# Patient Record
Sex: Female | Born: 1955
Health system: Southern US, Community
[De-identification: ages and names within clinical notes are randomized; demographics above are authoritative.]

## PROBLEM LIST (undated history)

## (undated) DIAGNOSIS — I099 Rheumatic heart disease, unspecified: Secondary | ICD-10-CM

## (undated) DIAGNOSIS — O039 Complete or unspecified spontaneous abortion without complication: Secondary | ICD-10-CM

## (undated) DIAGNOSIS — T7840XA Allergy, unspecified, initial encounter: Secondary | ICD-10-CM

## (undated) DIAGNOSIS — I4892 Unspecified atrial flutter: Secondary | ICD-10-CM

## (undated) DIAGNOSIS — E785 Hyperlipidemia, unspecified: Secondary | ICD-10-CM

## (undated) DIAGNOSIS — I10 Essential (primary) hypertension: Secondary | ICD-10-CM

## (undated) DIAGNOSIS — R809 Proteinuria, unspecified: Secondary | ICD-10-CM

## (undated) DIAGNOSIS — K219 Gastro-esophageal reflux disease without esophagitis: Secondary | ICD-10-CM

## (undated) DIAGNOSIS — R011 Cardiac murmur, unspecified: Secondary | ICD-10-CM

## (undated) HISTORY — DX: Cardiac murmur, unspecified: R01.1

## (undated) HISTORY — PX: TONSILLECTOMY: SHX5217

## (undated) HISTORY — DX: Unspecified atrial flutter: I48.92

## (undated) HISTORY — DX: Proteinuria, unspecified: R80.9

## (undated) HISTORY — DX: Rheumatic heart disease, unspecified: I09.9

## (undated) HISTORY — DX: Complete or unspecified spontaneous abortion without complication: O03.9

## (undated) HISTORY — DX: Gastro-esophageal reflux disease without esophagitis: K21.9

## (undated) HISTORY — PX: OTHER SURGICAL HISTORY: SHX169

## (undated) HISTORY — DX: Essential (primary) hypertension: I10

## (undated) HISTORY — DX: Hyperlipidemia, unspecified: E78.5

## (undated) HISTORY — DX: Allergy, unspecified, initial encounter: T78.40XA

---

## 1997-02-16 HISTORY — PX: OTHER SURGICAL HISTORY: SHX169

## 2001-02-16 HISTORY — PX: OTHER SURGICAL HISTORY: SHX169

## 2005-06-08 ENCOUNTER — Ambulatory Visit: Payer: Self-pay | Admitting: Family Medicine

## 2005-06-15 ENCOUNTER — Ambulatory Visit: Payer: Self-pay | Admitting: Family Medicine

## 2005-06-29 ENCOUNTER — Ambulatory Visit: Payer: Self-pay | Admitting: Family Medicine

## 2005-07-06 ENCOUNTER — Ambulatory Visit: Payer: Self-pay | Admitting: Family Medicine

## 2005-07-10 ENCOUNTER — Ambulatory Visit: Payer: Self-pay | Admitting: Family Medicine

## 2005-08-31 ENCOUNTER — Ambulatory Visit: Payer: Self-pay | Admitting: Family Medicine

## 2005-10-05 ENCOUNTER — Ambulatory Visit: Payer: Self-pay | Admitting: Family Medicine

## 2005-11-02 ENCOUNTER — Ambulatory Visit: Payer: Self-pay | Admitting: Family Medicine

## 2005-11-04 ENCOUNTER — Encounter: Payer: Self-pay | Admitting: Family Medicine

## 2005-11-04 LAB — CONVERTED CEMR LAB
Cholesterol: 177 mg/dL
HDL: 40 mg/dL
LDL Cholesterol: 104 mg/dL
LDL Cholesterol: 104 mg/dL

## 2005-11-24 DIAGNOSIS — E785 Hyperlipidemia, unspecified: Secondary | ICD-10-CM

## 2005-11-24 DIAGNOSIS — R7301 Impaired fasting glucose: Secondary | ICD-10-CM

## 2005-11-24 DIAGNOSIS — I152 Hypertension secondary to endocrine disorders: Secondary | ICD-10-CM | POA: Insufficient documentation

## 2005-11-24 DIAGNOSIS — I10 Essential (primary) hypertension: Secondary | ICD-10-CM | POA: Insufficient documentation

## 2005-12-21 ENCOUNTER — Ambulatory Visit: Payer: Self-pay | Admitting: Family Medicine

## 2005-12-28 ENCOUNTER — Encounter: Payer: Self-pay | Admitting: Family Medicine

## 2005-12-28 ENCOUNTER — Ambulatory Visit: Payer: Self-pay | Admitting: Family Medicine

## 2005-12-28 ENCOUNTER — Other Ambulatory Visit: Admission: RE | Admit: 2005-12-28 | Discharge: 2005-12-28 | Payer: Self-pay | Admitting: Family Medicine

## 2005-12-28 LAB — CONVERTED CEMR LAB

## 2005-12-31 ENCOUNTER — Encounter: Payer: Self-pay | Admitting: Family Medicine

## 2006-01-18 ENCOUNTER — Encounter: Payer: Self-pay | Admitting: Family Medicine

## 2006-01-20 ENCOUNTER — Telehealth (INDEPENDENT_AMBULATORY_CARE_PROVIDER_SITE_OTHER): Payer: Self-pay | Admitting: *Deleted

## 2006-01-25 ENCOUNTER — Encounter: Payer: Self-pay | Admitting: Family Medicine

## 2006-01-27 ENCOUNTER — Ambulatory Visit: Payer: Self-pay | Admitting: Family Medicine

## 2006-02-22 ENCOUNTER — Encounter: Payer: Self-pay | Admitting: Family Medicine

## 2006-02-23 LAB — CONVERTED CEMR LAB: INR: 3.5 — ABNORMAL HIGH (ref 0.0–1.5)

## 2006-03-30 ENCOUNTER — Encounter: Payer: Self-pay | Admitting: Family Medicine

## 2006-06-14 ENCOUNTER — Ambulatory Visit: Payer: Self-pay | Admitting: Family Medicine

## 2006-08-05 ENCOUNTER — Ambulatory Visit: Payer: Self-pay | Admitting: Family Medicine

## 2006-08-05 DIAGNOSIS — Z952 Presence of prosthetic heart valve: Secondary | ICD-10-CM | POA: Insufficient documentation

## 2006-08-05 DIAGNOSIS — Z9889 Other specified postprocedural states: Secondary | ICD-10-CM | POA: Insufficient documentation

## 2006-08-09 ENCOUNTER — Telehealth: Payer: Self-pay | Admitting: Family Medicine

## 2006-09-02 ENCOUNTER — Encounter: Payer: Self-pay | Admitting: Family Medicine

## 2006-09-06 ENCOUNTER — Telehealth: Payer: Self-pay | Admitting: Family Medicine

## 2006-11-15 ENCOUNTER — Encounter: Payer: Self-pay | Admitting: Family Medicine

## 2006-11-15 LAB — HM COLONOSCOPY: HM Colonoscopy: NORMAL

## 2007-01-14 ENCOUNTER — Encounter: Payer: Self-pay | Admitting: Family Medicine

## 2007-01-20 ENCOUNTER — Other Ambulatory Visit: Admission: RE | Admit: 2007-01-20 | Discharge: 2007-01-20 | Payer: Self-pay | Admitting: Family Medicine

## 2007-01-20 ENCOUNTER — Encounter: Payer: Self-pay | Admitting: Family Medicine

## 2007-01-20 ENCOUNTER — Ambulatory Visit: Payer: Self-pay | Admitting: Family Medicine

## 2007-02-15 ENCOUNTER — Encounter: Payer: Self-pay | Admitting: Family Medicine

## 2007-02-15 LAB — CONVERTED CEMR LAB
ALT: 12 units/L (ref 0–35)
Albumin: 4.6 g/dL (ref 3.5–5.2)
CO2: 21 meq/L (ref 19–32)
Calcium: 9.9 mg/dL (ref 8.4–10.5)
Chloride: 105 meq/L (ref 96–112)
Cholesterol: 185 mg/dL (ref 0–200)
Creatinine, Ser: 0.8 mg/dL (ref 0.40–1.20)
Potassium: 4.1 meq/L (ref 3.5–5.3)
Total CHOL/HDL Ratio: 3.8

## 2007-02-16 ENCOUNTER — Encounter: Payer: Self-pay | Admitting: Family Medicine

## 2007-02-22 ENCOUNTER — Encounter: Payer: Self-pay | Admitting: Family Medicine

## 2007-02-22 ENCOUNTER — Telehealth: Payer: Self-pay | Admitting: Family Medicine

## 2007-04-05 ENCOUNTER — Encounter: Payer: Self-pay | Admitting: Family Medicine

## 2007-06-30 ENCOUNTER — Ambulatory Visit: Payer: Self-pay | Admitting: Family Medicine

## 2007-10-11 ENCOUNTER — Encounter: Payer: Self-pay | Admitting: Family Medicine

## 2007-10-11 LAB — CONVERTED CEMR LAB
Cholesterol: 139 mg/dL
HDL: 46 mg/dL
LDL Cholesterol: 72 mg/dL
Triglycerides: 103 mg/dL

## 2007-11-29 ENCOUNTER — Encounter: Payer: Self-pay | Admitting: Family Medicine

## 2008-01-30 ENCOUNTER — Encounter: Payer: Self-pay | Admitting: Family Medicine

## 2008-02-27 ENCOUNTER — Ambulatory Visit: Payer: Self-pay | Admitting: Family Medicine

## 2008-02-27 ENCOUNTER — Encounter: Payer: Self-pay | Admitting: Family Medicine

## 2008-02-27 ENCOUNTER — Other Ambulatory Visit: Admission: RE | Admit: 2008-02-27 | Discharge: 2008-02-27 | Payer: Self-pay | Admitting: Family Medicine

## 2008-02-28 ENCOUNTER — Encounter: Payer: Self-pay | Admitting: Family Medicine

## 2008-02-28 LAB — CONVERTED CEMR LAB
ALT: 17 units/L (ref 0–35)
AST: 20 units/L (ref 0–37)
Alkaline Phosphatase: 61 units/L (ref 39–117)
CO2: 24 meq/L (ref 19–32)
Creatinine, Ser: 0.75 mg/dL (ref 0.40–1.20)
LDL Cholesterol: 93 mg/dL (ref 0–99)
Pap Smear: NORMAL
Sodium: 142 meq/L (ref 135–145)
TSH: 2.698 microintl units/mL (ref 0.350–4.50)
Total Bilirubin: 0.8 mg/dL (ref 0.3–1.2)
Total CHOL/HDL Ratio: 3.4
Total Protein: 7.8 g/dL (ref 6.0–8.3)
VLDL: 20 mg/dL (ref 0–40)

## 2008-04-02 ENCOUNTER — Ambulatory Visit: Payer: Self-pay | Admitting: Family Medicine

## 2008-05-02 ENCOUNTER — Encounter: Payer: Self-pay | Admitting: Family Medicine

## 2008-08-14 ENCOUNTER — Telehealth: Payer: Self-pay | Admitting: Family Medicine

## 2008-08-27 ENCOUNTER — Encounter: Payer: Self-pay | Admitting: Family Medicine

## 2008-08-28 LAB — CONVERTED CEMR LAB
AST: 22 units/L (ref 0–37)
Albumin: 4.4 g/dL (ref 3.5–5.2)
Alkaline Phosphatase: 64 units/L (ref 39–117)
Cholesterol, target level: 200 mg/dL
HDL goal, serum: 40 mg/dL
LDL Cholesterol: 87 mg/dL (ref 0–99)
Potassium: 4.5 meq/L (ref 3.5–5.3)
Sodium: 140 meq/L (ref 135–145)
Total Bilirubin: 0.6 mg/dL (ref 0.3–1.2)
Total Protein: 7.3 g/dL (ref 6.0–8.3)
Triglycerides: 129 mg/dL (ref <150)
VLDL: 26 mg/dL (ref 0–40)

## 2008-09-03 ENCOUNTER — Ambulatory Visit: Payer: Self-pay | Admitting: Family Medicine

## 2008-09-04 ENCOUNTER — Telehealth: Payer: Self-pay | Admitting: Family Medicine

## 2008-10-02 ENCOUNTER — Encounter: Payer: Self-pay | Admitting: Family Medicine

## 2008-10-08 ENCOUNTER — Ambulatory Visit: Payer: Self-pay | Admitting: Family Medicine

## 2008-10-08 LAB — CONVERTED CEMR LAB
Glucose, Urine, Semiquant: >=1000
Nitrite: NEGATIVE
Protein, U semiquant: NEGATIVE
pH: 6

## 2008-10-15 ENCOUNTER — Telehealth: Payer: Self-pay | Admitting: Family Medicine

## 2008-11-12 ENCOUNTER — Telehealth: Payer: Self-pay | Admitting: *Deleted

## 2009-01-23 ENCOUNTER — Ambulatory Visit: Payer: Self-pay | Admitting: Family Medicine

## 2009-01-25 ENCOUNTER — Encounter: Payer: Self-pay | Admitting: Family Medicine

## 2009-01-25 ENCOUNTER — Telehealth: Payer: Self-pay | Admitting: Family Medicine

## 2009-02-01 ENCOUNTER — Encounter: Payer: Self-pay | Admitting: Family Medicine

## 2009-03-11 ENCOUNTER — Ambulatory Visit: Payer: Self-pay | Admitting: Family Medicine

## 2009-03-11 LAB — CONVERTED CEMR LAB
Albumin: 4.6 g/dL (ref 3.5–5.2)
BUN: 16 mg/dL (ref 6–23)
Calcium: 9.8 mg/dL (ref 8.4–10.5)
Chloride: 103 meq/L (ref 96–112)
Cholesterol: 153 mg/dL (ref 0–200)
Creatinine, Ser: 0.84 mg/dL (ref 0.40–1.20)
Glucose, Bld: 123 mg/dL — ABNORMAL HIGH (ref 70–99)
HDL: 43 mg/dL (ref >39)
Potassium: 4.6 meq/L (ref 3.5–5.3)
Total CHOL/HDL Ratio: 3.6
Triglycerides: 126 mg/dL (ref <150)

## 2009-04-05 ENCOUNTER — Telehealth: Payer: Self-pay | Admitting: Family Medicine

## 2009-04-08 ENCOUNTER — Encounter (INDEPENDENT_AMBULATORY_CARE_PROVIDER_SITE_OTHER): Payer: Self-pay | Admitting: *Deleted

## 2009-05-06 ENCOUNTER — Encounter: Payer: Self-pay | Admitting: Family Medicine

## 2009-05-28 ENCOUNTER — Encounter: Payer: Self-pay | Admitting: Family Medicine

## 2009-11-18 ENCOUNTER — Encounter (INDEPENDENT_AMBULATORY_CARE_PROVIDER_SITE_OTHER): Payer: Self-pay | Admitting: *Deleted

## 2009-12-12 ENCOUNTER — Encounter: Payer: Self-pay | Admitting: Family Medicine

## 2009-12-13 ENCOUNTER — Ambulatory Visit: Payer: Self-pay | Admitting: Family Medicine

## 2010-01-17 ENCOUNTER — Ambulatory Visit: Payer: Self-pay | Admitting: Family Medicine

## 2010-02-20 ENCOUNTER — Encounter: Payer: Self-pay | Admitting: Family Medicine

## 2010-03-18 NOTE — Progress Notes (Signed)
Summary: Blood in stool  Phone Note Call from Patient   Summary of Call: Hi Kim, pt called in with blood in stool just this morning, no pain, please call patient to advise [phone], pt did mention she is on blood thinner. Thanks, Diane  Follow-up for Phone Call        [facility] LPN-  This morning there was blood bright red in the water in the toilet. when wiped it looked to be dark blood. Please Advise. No history of hemmorroids Follow-up by: Kathlene November,  April 05, 2009 10:59 AM  Additional Follow-up for Phone Call Additional follow up Details #1::        Lets check [name] today and make sure not too thin. If normal then needs GI referrral.  Additional Follow-up by: Nani Gasser MD,  April 05, 2009 12:40 PM  New Problems: BLOOD IN STOOL (ICD-578.1)   Additional Follow-up for Phone Call Additional follow up Details #2::    Pt has home PT/[name] machine- took it and [name] is 4.3 Follow-up by: Kathlene November,  April 05, 2009 2:07 PM  Additional Follow-up for Phone Call Additional follow up Details #3:: Details for Additional Follow-up Action Taken: Skip dose tonight.  Will refer to GI for next week.  If has any more bloodly stools over the weekend then please go to the ED.    04/05/2009 @ 2:41pm-Pt notified of MD instructions. KJ LPN Additional Follow-up by: Nani Gasser MD,  April 05, 2009 2:18 PM  New Problems: BLOOD IN STOOL (ICD-578.1)

## 2010-03-18 NOTE — Letter (Signed)
Summary: Primary Care Consult Scheduled Letter  [place] [facility]  [address]   [place], [place] [address]   Phone: [phone]  Fax: [phone]      04/08/2009 MRN: [id number]  Erin Hill [address] [place], [place]  [address]    Dear Ms. Swatzell,      We have scheduled an appointment for you.  At the recommendation of DR METHENEY, we have scheduled you a consult with [facility],[place]  DR Eliberto Ivory   on FEBRUARY  25,2011 at [facility].  Their address is__[address] [place] . The office phone number is [phone].  If this appointment day and time is not convenient for you, please feel free to call the office of the doctor you are being referred to at the number listed above and reschedule the appointment.     It is important for you to keep your scheduled appointments. We are here to make sure you are given good patient care. If you have questions or you have made changes to your appointment, please notify us at  [phone], ask for  HELEN.    Thank you,  Patient Care Coordinator  at [facility]

## 2010-03-18 NOTE — Assessment & Plan Note (Signed)
Summary: 2nd TB skin test - jr  Nurse Visit   Allergies: 1)  ! Versed 2)  ! Erythromycin  Immunizations Administered:  PPD Skin Test:    Vaccine Type: PPD    Site: left forearm    Mfr: Sanofi Pasteur    Dose: 0.1 ml    Route: ID    Given by: Kathlene November LPN    Exp. Date: 07/24/2011    Lot #: [id number]  Orders Added: 1)  TB Skin Test [86580] 2)  Admin 1st Vaccine [90471]  Appended Document: 2nd TB skin test - jr   PPD Results    Date of reading: 01/20/2010    Results: < 5mm    Interpretation: negative

## 2010-03-18 NOTE — Assessment & Plan Note (Signed)
Summary: injection  Nurse Visit   Allergies: 1)  ! Versed 2)  ! Erythromycin  Immunizations Administered:  PPD Skin Test:    Vaccine Type: PPD    Site: left forearm    Mfr: Sanofi Pasteur    Dose: 0.1 ml    Route: ID    Given by: Kathlene November LPN    Exp. Date: 12/19/2010    Lot #: [id number]  Varicella Vaccine # 2:    Vaccine Type: Varicella    Site: left deltoid    Mfr: Merck    Dose: 0.5 ml    Route: IM    Given by: Kathlene November LPN    Exp. Date: 05/04/2010    Lot #: [id number]    VIS given: 04/29/06 version given December 13, 2009.  Orders Added: 1)  TB Skin Test [86580] 2)  Admin 1st Vaccine [90471] 3)  Varicella  [90716] 4)  Admin of Any Addtl Vaccine [78295]

## 2010-03-18 NOTE — Letter (Signed)
Summary: [facility]  [facility]   Imported By: Lanelle Bal 06/13/2009 09:59:17  _____________________________________________________________________  External Attachment:    Type:   Image     Comment:   External Document

## 2010-03-18 NOTE — Consult Note (Signed)
Summary: [facility]  [facility]   Imported By: Lanelle Bal 05/10/2009 08:50:51  _____________________________________________________________________  External Attachment:    Type:   Image     Comment:   External Document

## 2010-03-18 NOTE — Letter (Signed)
Summary: Health Forms for [facility] Forms for College   Imported By: Maryln Gottron 01/24/2010 15:09:53  _____________________________________________________________________  External Attachment:    Type:   Image     Comment:   External Document

## 2010-03-18 NOTE — Miscellaneous (Signed)
Summary: flu  Clinical Lists Changes  Observations: Added new observation of FLU VAX: Historical (11/16/2009 12:09)      Immunization History:  Influenza Immunization History:    Influenza:  historical (11/16/2009)

## 2010-03-18 NOTE — Assessment & Plan Note (Signed)
Summary: CPE   Vital Signs:  Patient profile:   55 year old female Height:      62 inches Weight:      160 pounds Pulse rate:   75 / minute BP sitting:   127 / 72  (left arm) Cuff size:   regular  Vitals Entered By: Kathlene November (March 11, 2009 9:28 AM) CC: CPE no pap   Primary Care Provider:  Linford Arnold, C  CC:  CPE no pap.  History of Present Illness: May be losing her job this summer.  [employer] will likely be closing this summer. Donig well. Had her mammao in December that was normal. Follows u pyearly with her cardilogist for her valve replacements. Her vaccines are up to date. She is having some hot flashes but says they are mild.    Current Medications (verified): 1)  Allegra 180 Mg Tabs (Fexofenadine Hcl) .... Take 1 Tablet By Mouth Once A Day 2)  Coumadin 5 Mg Tabs (Warfarin Sodium) .... Per Cardiology 3)  Crestor 10 Mg  Tabs (Rosuvastatin Calcium) .... 1.5  Tab By Mouth At Bedtime 4)  Gemfibrozil 600 Mg Tabs (Gemfibrozil) .... Take 1 Tablet By Mouth Two Times A Day 5)  Metoprolol Succinate 50 Mg  Tb24 (Metoprolol Succinate) .... Take One Tablet By Mouth Once A Day 6)  Quinapril-Hydrochlorothiazide 10-12.5 Mg Tabs (Quinapril-Hydrochlorothiazide) .... Take 1 Tablet By Mouth Once A Day 7)  Nasonex 50 Mcg/act Susp (Mometasone Furoate) .... 2 Sprays in Each Nostril Daily  Allergies (verified): 1)  ! Versed 2)  ! Erythromycin  Comments:  Nurse/Medical Assistant: The patient's medications and allergies were reviewed with the patient and were updated in the Medication and Allergy Lists. Kathlene November (March 11, 2009 9:28 AM)  Past History:  Past Medical History: Last updated: 01/20/2007 Chronic Hearing Loss,  Hx of chronic proteinuria (not related to DM),  Hx Paroxysmal Atrial Dysrythmias,  Rheumatic Heart Dz s/p aortic/mitral valve replace  Past Surgical History: Double heart valve replacement `99, `03,  echo EF 75-80%,  Stapes Surgery, left.. then removed 2 years  laters.  TAB x 1, SAB x 2  Tonsillectomy as a child  Family History: Reviewed history from 12/28/2005 and no changes required. DM-parents,  High chol-father,  HTN-mother  Social History: Reviewed history from 01/20/2007 and no changes required. 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  Married to [employer] who is an alcholic (also pt here).  No children.  5 dogs.  2 miscarriages.  Never smoked, no EtOH, no drugs, no caffeine.  No exercise.  Review of Systems  The patient denies anorexia, fever, weight loss, weight gain, vision loss, decreased hearing, hoarseness, chest pain, syncope, dyspnea on exertion, peripheral edema, prolonged cough, headaches, hemoptysis, abdominal pain, melena, hematochezia, severe indigestion/heartburn, hematuria, incontinence, genital sores, muscle weakness, suspicious skin lesions, transient blindness, difficulty walking, depression, unusual weight change, abnormal bleeding, enlarged lymph nodes, and breast masses.    Physical Exam  General:  Well-developed,well-nourished,in no acute distress; alert,appropriate and cooperative throughout examination Head:  Normocephalic and atraumatic without obvious abnormalities. No apparent alopecia or balding. Eyes:  No corneal or conjunctival inflammation noted. EOMI. Perrla. Ears:  External ear exam shows no significant lesions or deformities.  Otoscopic examination reveals clear canals, tympanic membranes are intact bilaterally without bulging, retraction, inflammation or discharge. Hearing is grossly normal bilaterally. Nose:  External nasal examination shows no deformity or inflammation. Nasal mucosa are pink and moist without lesions or exudates. Mouth:  Oral mucosa and oropharynx  without lesions or exudates.  Teeth in good repair. Neck:  No deformities, masses, or tenderness noted. Lungs:  Normal respiratory effort, chest expands symmetrically. Lungs are clear to auscultation, no crackles or  wheezes. Heart:  Harsh systolic murmur 4/6   radiating into the carotids bilaterally.  Systolic click as well.  Abdomen:  Bowel sounds positive,abdomen soft and non-tender without masses, organomegaly or hernias noted. Msk:  No deformity or scoliosis noted of thoracic or lumbar spine.   Pulses:  R and L carotid,radial,dorsalis pedis and posterior tibial pulses are full and equal bilaterally Extremities:  No clubbing, cyanosis, edema, or deformity noted with normal full range of motion of all joints.   Neurologic:  No cranial nerve deficits noted. Station and gait are normal. DTRs are symmetrical throughout. Sensory, motor and coordinative functions appear intact. Skin:  no rashes.   Cervical Nodes:  No lymphadenopathy noted Psych:  Cognition and judgment appear intact. Alert and cooperative with normal attention span and concentration. No apparent delusions, illusions, hallucinations   Impression & Recommendations:  Problem # 1:  HEALTH SCREENING (ICD-V70.0) Exam is normal.   Screening labs due. Make sure getting some regular exercise.   Would like to get 2 separate rx for her Crestor (5mg  and 10mg ).   Her mammogram and vaccines are uptodate.  Orders: T-Comprehensive Metabolic Panel [id number] [id number]) T-Lipid Profile [id number]) T-TSH [id number]) T- * Misc. Laboratory test [id number])  Complete Medication List: 1)  Allegra 180 Mg Tabs (Fexofenadine hcl) .... Take 1 tablet by mouth once a day 2)  Coumadin 5 Mg Tabs (Warfarin sodium) .... Per cardiology 3)  Crestor 5 Mg Tabs (Rosuvastatin calcium) .... Take 1 tablet by mouth once a day at bedtime 4)  Gemfibrozil 600 Mg Tabs (Gemfibrozil) .... Take 1 tablet by mouth two times a day 5)  Metoprolol Succinate 50 Mg Tb24 (Metoprolol succinate) .... Take one tablet by mouth once a day 6)  Quinapril-hydrochlorothiazide 10-12.5 Mg Tabs (Quinapril-hydrochlorothiazide) .... Take 1 tablet by mouth once a day 7)  Nasonex 50 Mcg/act Susp (Mometasone  furoate) .... 2 sprays in each nostril daily 8)  Crestor 10 Mg Tabs (Rosuvastatin calcium) .... Take 1 tablet by mouth once a day Prescriptions: CRESTOR 5 MG TABS (ROSUVASTATIN CALCIUM) Take 1 tablet by mouth once a day at bedtime  #90 x 3   Entered and Authorized by:   Nani Gasser MD   Signed by:   Nani Gasser MD on 03/11/2009   Method used:   Electronically to        [facility]* (mail-order)             ,          Ph: [phone]       Fax: [phone]   RxID:   [id number]   Appended Document: CPE   Diabetes Management History:      The patient is a 55 years old female who comes in for evaluation of DM Type 2.  She says that she is not exercising regularly.    Diabetes Management Exam:    Eye Exam:       Eye Exam done elsewhere          Date: 05/28/2009          Results: normal          Done by: [facility]  Diabetes Management Assessment/Plan:      The following lipid goals have been established for the patient: Total cholesterol goal of 200; LDL cholesterol  goal of 100; HDL cholesterol goal of 40; Triglyceride goal of 150.

## 2010-03-28 ENCOUNTER — Other Ambulatory Visit: Payer: Self-pay | Admitting: Family Medicine

## 2010-03-28 ENCOUNTER — Encounter (INDEPENDENT_AMBULATORY_CARE_PROVIDER_SITE_OTHER): Payer: BC Managed Care – PPO | Admitting: Family Medicine

## 2010-03-28 ENCOUNTER — Encounter: Payer: Self-pay | Admitting: Family Medicine

## 2010-03-28 ENCOUNTER — Other Ambulatory Visit (HOSPITAL_COMMUNITY)
Admission: RE | Admit: 2010-03-28 | Discharge: 2010-03-28 | Disposition: A | Discharge status: Home / Self Care | Payer: BC Managed Care – PPO | Source: Ambulatory Visit | Attending: Family Medicine | Admitting: Family Medicine

## 2010-03-28 DIAGNOSIS — Z1159 Encounter for screening for other viral diseases: Secondary | ICD-10-CM | POA: Insufficient documentation

## 2010-03-28 DIAGNOSIS — Z01419 Encounter for gynecological examination (general) (routine) without abnormal findings: Secondary | ICD-10-CM | POA: Insufficient documentation

## 2010-03-28 LAB — HM PAP SMEAR

## 2010-03-30 LAB — CONVERTED CEMR LAB
ALT: 24 units/L (ref 0–35)
Albumin: 4.6 g/dL (ref 3.5–5.2)
CO2: 25 meq/L (ref 19–32)
Calcium: 9.6 mg/dL (ref 8.4–10.5)
Chloride: 102 meq/L (ref 96–112)
Cholesterol: 170 mg/dL (ref 0–200)
Glucose, Bld: 138 mg/dL — ABNORMAL HIGH (ref 70–99)
MCV: 85 fL (ref 78.0–100.0)
Platelets: 371 10*3/uL (ref 150–400)
Potassium: 4.3 meq/L (ref 3.5–5.3)
RBC: 5 M/uL (ref 3.87–5.11)
Sodium: 138 meq/L (ref 135–145)
Total Protein: 7.6 g/dL (ref 6.0–8.3)
Triglycerides: 89 mg/dL (ref <150)
VLDL: 18 mg/dL (ref 0–40)
WBC: 7.7 10*3/uL (ref 4.0–10.5)

## 2010-04-03 LAB — CYTOLOGY - PAP: Pap Smear: NORMAL

## 2010-04-03 NOTE — Assessment & Plan Note (Signed)
Summary: CPE with pap   Vital Signs:  Patient profile:   55 year old female Height:      62 inches Weight:      164 pounds Pulse rate:   89 / minute BP sitting:   114 / 78  (right arm)  Vitals Entered By: Avon Gully CMA, Duncan Dull) (March 28, 2010 9:05 AM) CC: CPE and pap   Primary Care Provider:  Linford Arnold, C  CC:  CPE and pap.  History of Present Illness: Finished school adn plans to start working out.  Here for CPE today. Need pap.   Current Medications (verified): 1)  Allegra 180 Mg Tabs (Fexofenadine Hcl) .... Take 1 Tablet By Mouth Once A Day 2)  Coumadin 5 Mg Tabs (Warfarin Sodium) .... Per Cardiology 3)  Gemfibrozil 600 Mg Tabs (Gemfibrozil) .... Take 1 Tablet By Mouth Two Times A Day 4)  Metoprolol Succinate 50 Mg  Tb24 (Metoprolol Succinate) .... Take One Tablet By Mouth Once A Day 5)  Quinapril-Hydrochlorothiazide 10-12.5 Mg Tabs (Quinapril-Hydrochlorothiazide) .... Take 1 Tablet By Mouth Once A Day 6)  Nasonex 50 Mcg/act Susp (Mometasone Furoate) .... 2 Sprays in Each Nostril Daily 7)  Crestor 10 Mg Tabs (Rosuvastatin Calcium) .... Take 1 Tablet By Mouth Once A Day  Allergies (verified): 1)  ! Versed 2)  ! Erythromycin  Comments:  Nurse/Medical Assistant: The patient's medications and allergies were reviewed with the patient and were updated in the Medication and Allergy Lists. Avon Gully CMA, Duncan Dull) (March 28, 2010 9:05 AM)  Past History:  Past Medical History: Last updated: 01/20/2007 Chronic Hearing Loss,  Hx of chronic proteinuria (not related to DM),  Hx Paroxysmal Atrial Dysrythmias,  Rheumatic Heart Dz s/p aortic/mitral valve replace  Past Surgical History: Last updated: 03/11/2009 Double heart valve replacement `99, `03,  echo EF 75-80%,  Stapes Surgery, left.. then removed 2 years laters.  TAB x 1, SAB x 2  Tonsillectomy as a child  Family History: DM-parents,  Father with hi chol Mother wtih Lung Ca, HTN, was a  Neurosurgeon, smoked unti lher 40s.   Social History: Not working.  Currently attending college.  Married to [employer] who is an alcholic (also pt here).  No children.  4 dogs.  2 miscarriages.  Never smoked, no EtOH, no drugs, no caffeine.  No exercise.  Review of Systems  The patient denies anorexia, fever, weight loss, weight gain, vision loss, decreased hearing, hoarseness, chest pain, syncope, dyspnea on exertion, peripheral edema, prolonged cough, headaches, hemoptysis, abdominal pain, melena, hematochezia, severe indigestion/heartburn, hematuria, incontinence, genital sores, muscle weakness, suspicious skin lesions, transient blindness, difficulty walking, depression, unusual weight change, abnormal bleeding, enlarged lymph nodes, and breast masses.         Will get Chest pain if forgets to take her meds.    Physical Exam  General:  Well-developed,well-nourished,in no acute distress; alert,appropriate and cooperative throughout examination Head:  Normocephalic and atraumatic without obvious abnormalities. No apparent alopecia or balding. Eyes:  No corneal or conjunctival inflammation noted. EOMI. Perrla.  Ears:  External ear exam shows no significant lesions or deformities.  Otoscopic examination reveals clear canals, tympanic membranes are intact bilaterally without bulging, retraction, inflammation or discharge. Hearing is grossly normal bilaterally. Nose:  External nasal examination shows no deformity or inflammation.  Mouth:  Oral mucosa and oropharynx without lesions or exudates.  Teeth in good repair. Neck:  No deformities, masses, or tenderness noted. Chest Wall:  No deformities, masses, or tenderness noted. Lungs:  Normal  respiratory effort, chest expands symmetrically. Lungs are clear to auscultation, no crackles or wheezes. Heart:  Normal rate and regular rhythm. S1 and S2 normal without gallop with 3/6 SEM with a clicking. No carotitd or abdominal bruits.  Abdomen:  Bowel  sounds positive,abdomen soft and non-tender without masses, organomegaly or hernias noted. Genitalia:  Normal introitus for age, no external lesions, no vaginal discharge, mucosa pink and moist, no vaginal or cervical lesions, no vaginal atrophy, no friaility or hemorrhage, normal uterus size and position, no adnexal masses or tenderness. Varicose veins on the labia.  Msk:  No deformity or scoliosis noted of thoracic or lumbar spine.   Pulses:  R and L carotid,radial, dorsalis pedis and posterior tibial pulses are full and equal bilaterally Extremities:  No clubbing, cyanosis, edema, or deformity noted with normal full range of motion of all joints.   Neurologic:  No cranial nerve deficits noted. Station and gait are normal. . Sensory, motor and coordinative functions appear intact. Skin:  no rashes.   Cervical Nodes:  No lymphadenopathy noted Psych:  Cognition and judgment appear intact. Alert and cooperative with normal attention span and concentration. No apparent delusions, illusions, hallucinations   Impression & Recommendations:  Problem # 1:  ROUTINE GYNECOLOGICAL EXAMINATION (ICD-V72.31) Assessment Unchanged Exam is normal.  Due for screning labs Colonoscopy and mammogram are normal.  Vaccines are up to date.  Orders: T-TSH [id number]) T-Comprehensive Metabolic Panel [id number]) T-Lipid Profile [id number]) T-CBC No Diff ([id number])  Complete Medication List: 1)  Allegra 180 Mg Tabs (Fexofenadine hcl) .... Take 1 tablet by mouth once a day 2)  Coumadin 5 Mg Tabs (Warfarin sodium) .... Per cardiology 3)  Gemfibrozil 600 Mg Tabs (Gemfibrozil) .... Take 1 tablet by mouth two times a day 4)  Metoprolol Succinate 50 Mg Tb24 (Metoprolol succinate) .... Take one tablet by mouth once a day 5)  Quinapril-hydrochlorothiazide 10-12.5 Mg Tabs (Quinapril-hydrochlorothiazide) .... Take 1 tablet by mouth once a day 6)  Nasonex 50 Mcg/act Susp (Mometasone furoate) .... 2 sprays in each  nostril daily 7)  Crestor 10 Mg Tabs (Rosuvastatin calcium) .... Take 1 tablet by mouth once a day  Patient Instructions: 1)  Doing well.  2)  Follow up in 3 months for diabetes.  3)  Call if area on breast changes or becomes more tender.    Orders Added: 1)  T-TSH [[id number]] 2)  T-Comprehensive Metabolic Panel [[id number]] 3)  T-Lipid Profile [[id number]] 4)  T-CBC No Diff [[id number]] 5)  Est. Patient age 24-64 [phone]

## 2010-05-12 ENCOUNTER — Telehealth: Payer: Self-pay | Admitting: Family Medicine

## 2010-05-12 NOTE — Telephone Encounter (Signed)
Pt called and states there is a pain in her lower rt side x 3-4 days. Feels like a pulling pain lke ovulation pain.  And she has not had a period in over a year and wants to know what to do. Advised pt to come in tomorrow @ 230 for appt with dr. Linford Arnold

## 2010-05-13 ENCOUNTER — Encounter: Payer: Self-pay | Admitting: Family Medicine

## 2010-05-13 ENCOUNTER — Ambulatory Visit (INDEPENDENT_AMBULATORY_CARE_PROVIDER_SITE_OTHER): Payer: BC Managed Care – PPO | Admitting: Family Medicine

## 2010-05-13 VITALS — BP 128/71 | HR 67 | Temp 97.9°F | Ht 62.0 in | Wt 164.0 lb

## 2010-05-13 DIAGNOSIS — R1031 Right lower quadrant pain: Secondary | ICD-10-CM

## 2010-05-13 LAB — CBC WITH DIFFERENTIAL/PLATELET
Basophils Absolute: 0.2 10*3/uL — ABNORMAL HIGH (ref 0.0–0.1)
Eosinophils Absolute: 0.7 10*3/uL (ref 0.0–0.7)
Lymphocytes Relative: 39 % (ref 12–46)
Lymphs Abs: 4 10*3/uL (ref 0.7–4.0)
MCH: 30.1 pg (ref 26.0–34.0)
Neutrophils Relative %: 49 % (ref 43–77)
Platelets: 318 10*3/uL (ref 150–400)
RBC: 4.89 MIL/uL (ref 3.87–5.11)
RDW: 14.2 % (ref 11.5–15.5)
WBC: 10.3 10*3/uL (ref 4.0–10.5)

## 2010-05-13 LAB — BUN: BUN: 14 mg/dL (ref 6–23)

## 2010-05-13 LAB — POCT URINALYSIS DIPSTICK
Leukocytes, UA: NEGATIVE
Nitrite, UA: NEGATIVE
Urobilinogen, UA: 0.2
pH, UA: 5.5

## 2010-05-13 NOTE — Patient Instructions (Signed)
Go to the Emergency Room if suddenly gets worse overnight. We will call you with the lab and schedule you for a CT of the abdomen/pelvis.

## 2010-05-13 NOTE — Progress Notes (Signed)
  Subjective:    Patient ID: Erin Hill, female    DOB: [birth year], 55 y.o.   MRN: [id number]  HPI Feels like a sharp pain in the RLQ almost like she is ovulating.  Sharp pain comes and goes but has a constant achy feeling.  More painful when hits  A bump while driving.  No hx of appendectomy.  She felt bloated and her breast felt swollen and nipples were sensitive. After the sharp pain notices an inc vaginal d/c. No cjhange in color or order, just a heavier physiologic d/c.   No fever. No nausea or change on bowels.  Last period 12/2008.  No vaginal bleeding.  No sexual activity in a long time, say not possible for her to be pregnant. No low back pain or urinary sxs.    Review of Systems     Objective:   Physical Exam  Constitutional: She appears well-developed and well-nourished.  HENT:  Head: Normocephalic.  Neck: Normal range of motion.  Cardiovascular: Normal rate and regular rhythm.   Murmur heard.      4/6 systolic murmur  Pulmonary/Chest: Effort normal and breath sounds normal.  Abdominal: Soft. Bowel sounds are normal. She exhibits no distension. There is no hepatosplenomegaly. There is tenderness in the right lower quadrant. There is no CVA tenderness.       Pain in the suprapubic area with plapation of the LLQ          Assessment & Plan:  RLQ pain - consider UTI vs appendicitis vs ovarian cyst or lesion.She is post menopausal so if she does have an ovarian cyst that would be very unusual.   Will get UA, CBC, and schedule for abodominal/pelvic CT for tomorrow. UA is negative for infection. Will see if she would like something for pain.  Pt was going to come back up after labs but went home first. Called and [facility] if she needs something for pain.  Go to ED if pain suddenly worse.

## 2010-05-14 ENCOUNTER — Telehealth: Payer: Self-pay | Admitting: Family Medicine

## 2010-05-14 ENCOUNTER — Ambulatory Visit
Admission: RE | Admit: 2010-05-14 | Discharge: 2010-05-14 | Disposition: A | Discharge status: Home / Self Care | Payer: BC Managed Care – PPO | Source: Ambulatory Visit | Attending: Family Medicine | Admitting: Family Medicine

## 2010-05-14 DIAGNOSIS — R1031 Right lower quadrant pain: Secondary | ICD-10-CM

## 2010-05-14 MED ORDER — IOHEXOL 300 MG/ML  SOLN
100.0000 mL | Freq: Once | INTRAMUSCULAR | Status: AC | PRN
  Administered 2010-05-14: 100 mL via INTRAVENOUS

## 2010-05-14 NOTE — Telephone Encounter (Signed)
Call pt: No appendicitis. No inflammation of bowel. Ovaries are normal. No kidney stone etc. I would say to Give it a few days and see if gets better on its own. Also white count was nl so likely not an infection. Call if sxs change, get worse, or if not resolved in one week.

## 2010-05-14 NOTE — Telephone Encounter (Signed)
Called and notified pt also told her that she does have a dx of DM w/c was confirmed with Dr. Linford Arnold and her last A1c was 6.4 which is in the diabetic range.Pt voiced understanding

## 2010-05-20 ENCOUNTER — Telehealth: Payer: Self-pay | Admitting: *Deleted

## 2010-05-20 MED ORDER — PREDNISONE 20 MG PO TABS: ORAL_TABLET | ORAL | Status: DC

## 2010-05-20 NOTE — Telephone Encounter (Signed)
Pt has a rash all over her body as an allergic reaction from contrast dye from CT that was done Friday and wants to know if something can be called into her [facility]ar [facility]

## 2010-05-20 NOTE — Telephone Encounter (Signed)
Can take benadryl 50mg  every 6-8 hours and will call in some prednisone

## 2010-07-23 ENCOUNTER — Encounter: Payer: Self-pay | Admitting: Family Medicine

## 2010-08-19 ENCOUNTER — Telehealth: Payer: Self-pay | Admitting: Family Medicine

## 2010-08-19 MED ORDER — ROSUVASTATIN CALCIUM 10 MG PO TABS: 10.0000 mg | ORAL_TABLET | Freq: Every day | ORAL | Status: DC

## 2010-08-19 MED ORDER — QUINAPRIL-HYDROCHLOROTHIAZIDE 10-12.5 MG PO TABS: 1.0000 | ORAL_TABLET | Freq: Every day | ORAL | Status: DC

## 2010-09-01 ENCOUNTER — Other Ambulatory Visit: Payer: Self-pay | Admitting: Family Medicine

## 2010-09-01 NOTE — Telephone Encounter (Signed)
Told pharmacist at [facility] to check and see if insurance will allow [place] to prescribe 10 mg tablets and have pt take 1 1/2 tabs daily to make the 15 mg daily.  At this point it is a mood point since the pharmacy already shipped out 20 mg before verifying with the provider first. Pending pharmacist call back. Jarvis Newcomer, LPN [facility] Dimes

## 2010-09-01 NOTE — Telephone Encounter (Signed)
See if they can give her 10 mg she can take one and a half tabs daily. It is too complicated to have hercut a 20 mg tab and then call in an extra 5 mg tab.

## 2010-09-01 NOTE — Telephone Encounter (Signed)
A prescription was written for the pt for Crestor  And they were instructed to take Crestor 15 mg.  The pharmacy in order to get approved through the insurance is giving pt crestor 20 mg and telling them to take (1/2) of the 20 mg to make 10 mg and they need a new script in addition for Crestor 5 mg so they can have pt take (1/2) of the 20 mg and add 5 mg to it to make the dose of 15 mg.   Plan:  Routed to Dr. Marlyne Beards, LPN [facility]

## 2010-09-01 NOTE — Telephone Encounter (Signed)
OK. Can she just take the 20mg  until she is due for refill?

## 2010-09-03 ENCOUNTER — Other Ambulatory Visit: Payer: Self-pay | Admitting: Family Medicine

## 2010-09-08 ENCOUNTER — Other Ambulatory Visit: Payer: Self-pay | Admitting: Family Medicine

## 2010-09-08 NOTE — Telephone Encounter (Signed)
Closed

## 2010-09-29 NOTE — Telephone Encounter (Signed)
error 

## 2010-11-14 ENCOUNTER — Other Ambulatory Visit: Payer: Self-pay | Admitting: Family Medicine

## 2011-02-01 ENCOUNTER — Other Ambulatory Visit: Payer: Self-pay | Admitting: Family Medicine

## 2011-02-08 ENCOUNTER — Other Ambulatory Visit: Payer: Self-pay | Admitting: Family Medicine

## 2011-02-23 ENCOUNTER — Telehealth: Payer: Self-pay | Admitting: Family Medicine

## 2011-02-23 NOTE — Telephone Encounter (Signed)
Please call patient. Normal mammogram.  Repeat in 1 year.  

## 2011-02-24 NOTE — Telephone Encounter (Signed)
Pt.notified

## 2011-03-05 ENCOUNTER — Encounter: Payer: BC Managed Care – PPO | Admitting: Family Medicine

## 2011-03-12 ENCOUNTER — Encounter: Payer: Self-pay | Admitting: Family Medicine

## 2011-03-12 ENCOUNTER — Ambulatory Visit (INDEPENDENT_AMBULATORY_CARE_PROVIDER_SITE_OTHER): Payer: BC Managed Care – PPO | Admitting: Family Medicine

## 2011-03-12 ENCOUNTER — Other Ambulatory Visit (HOSPITAL_COMMUNITY)
Admission: RE | Admit: 2011-03-12 | Discharge: 2011-03-12 | Disposition: A | Discharge status: Home / Self Care | Payer: Managed Care, Other (non HMO) | Source: Ambulatory Visit | Attending: Family Medicine | Admitting: Family Medicine

## 2011-03-12 VITALS — BP 131/66 | HR 64 | Wt 159.0 lb

## 2011-03-12 DIAGNOSIS — Z01419 Encounter for gynecological examination (general) (routine) without abnormal findings: Secondary | ICD-10-CM

## 2011-03-12 NOTE — Progress Notes (Signed)
  Subjective:     Erin Hill is a 56 y.o. female and is here for a comprehensive physical exam. The patient reports no problems.  History   Social History  . Marital Status: Married    Spouse Name: Theron Arista    Number of Children: N/A  . Years of Education: N/A   Occupational History  . AUTHORIZATIONS    Social History Main Topics  . Smoking status: Never Smoker   . Smokeless tobacco: Not on file  . Alcohol Use: No  . Drug Use: No  . Sexually Active: Yes -- Female partner(s)   Other Topics Concern  . Not on file   Social History Narrative   No regular exercise.     Health Maintenance  Topic Date Due  . Influenza Vaccine  11/17/2011  . Mammogram  02/22/2013  . Pap Smear  03/11/2014  . Tetanus/tdap  02/17/2015  . Colonoscopy  11/14/2016    The following portions of the patient's history were reviewed and updated as appropriate: allergies, current medications, past family history, past medical history, past social history, past surgical history and problem list.  Review of Systems A comprehensive review of systems was negative.   Objective:    BP 131/66  Pulse 64  Wt 159 lb (72.122 kg) General appearance: alert, cooperative and appears stated age Head: Normocephalic, without obvious abnormality, atraumatic Eyes: Conjunctiva clear, PERRLA, EOMi Ears: normal TM's and external ear canals both ears Nose: Nares normal. Septum midline. Mucosa normal. No drainage or sinus tenderness. Throat: lips, mucosa, and tongue normal; teeth and gums normal Neck: no adenopathy, no carotid bruit, no JVD, supple, symmetrical, trachea midline and thyroid not enlarged, symmetric, no tenderness/mass/nodules Back: symmetric, no curvature. ROM normal. No CVA tenderness. Lungs: clear to auscultation bilaterally Breasts: normal appearance, no masses or tenderness Heart: 4/6 SEM Abdomen: soft, non-tender; bowel sounds normal; no masses,  no organomegaly Pelvic: cervix normal in appearance, external  genitalia normal, no adnexal masses or tenderness, no cervical motion tenderness, rectovaginal septum normal, uterus normal size, shape, and consistency and vagina normal without discharge Extremities: extremities normal, atraumatic, no cyanosis or edema Pulses: 2+ and symmetric Skin: Skin color, texture, turgor normal. No rashes or lesions Lymph nodes: Cervical, supraclavicular, and axillary nodes normal. Neurologic: Grossly normal    Assessment:    Healthy female exam.      Plan:     See After Visit Summary for Counseling Recommendations  Start a regular exercise program and make sure you are eating a healthy diet Try to eat 4 servings of dairy a day or take a calcium supplement (500mg  twice a day). Your vaccines are up to date.  Due for lipids and labs and A1C. Will call with results.  Form completed for work as well with her up to date vaccinations.

## 2011-03-12 NOTE — Patient Instructions (Signed)
We will call you with your lab results. If you don't here from us in about a week then please give us a call at [phone]. Start a regular exercise program and make sure you are eating a healthy diet Try to eat 4 servings of dairy a day or take a calcium supplement (500mg twice a day). Your vaccines are up to date.   

## 2011-03-19 LAB — COMPLETE METABOLIC PANEL WITH GFR
ALT: 15 U/L (ref 0–35)
BUN: 15 mg/dL (ref 6–23)
CO2: 24 mEq/L (ref 19–32)
Creat: 0.76 mg/dL (ref 0.50–1.10)
GFR, Est African American: >89 mL/min
Total Bilirubin: 0.6 mg/dL (ref 0.3–1.2)

## 2011-03-19 LAB — LIPID PANEL
Cholesterol: 159 mg/dL (ref 0–200)
HDL: 51 mg/dL (ref >39)
LDL Cholesterol: 91 mg/dL (ref 0–99)
Triglycerides: 83 mg/dL (ref <150)

## 2011-05-04 ENCOUNTER — Other Ambulatory Visit: Payer: Self-pay | Admitting: Family Medicine

## 2011-07-23 ENCOUNTER — Ambulatory Visit
Admission: RE | Admit: 2011-07-23 | Discharge: 2011-07-23 | Disposition: A | Discharge status: Home / Self Care | Payer: Managed Care, Other (non HMO) | Source: Ambulatory Visit | Attending: Family Medicine | Admitting: Family Medicine

## 2011-07-23 ENCOUNTER — Encounter: Payer: Self-pay | Admitting: Family Medicine

## 2011-07-23 ENCOUNTER — Other Ambulatory Visit: Payer: Self-pay | Admitting: Family Medicine

## 2011-07-23 ENCOUNTER — Ambulatory Visit (INDEPENDENT_AMBULATORY_CARE_PROVIDER_SITE_OTHER): Payer: Managed Care, Other (non HMO) | Admitting: Family Medicine

## 2011-07-23 VITALS — BP 112/69 | HR 68 | Ht 62.0 in | Wt 158.0 lb

## 2011-07-23 DIAGNOSIS — M79671 Pain in right foot: Secondary | ICD-10-CM

## 2011-07-23 DIAGNOSIS — G4726 Circadian rhythm sleep disorder, shift work type: Secondary | ICD-10-CM

## 2011-07-23 DIAGNOSIS — M79609 Pain in unspecified limb: Secondary | ICD-10-CM

## 2011-07-23 DIAGNOSIS — M79673 Pain in unspecified foot: Secondary | ICD-10-CM

## 2011-07-23 NOTE — Patient Instructions (Signed)
We will call you with the sleep referral.

## 2011-07-23 NOTE — Progress Notes (Signed)
  Subjective:    Patient ID: Erin Hill, female    DOB: November 08, 1955, 56 y.o.   MRN: [id number]  HPI Right heels pain for about a year. Occ radiaties into the medial ankle.  Feels tight at time.  Occ swells. Working at night.  Uses tylenol occ.  Icing it makes it worse.  She says occasionally that foot feels restless and feels like she has to get up and move around to make it feel better. This does not happen on her right foot. No actual trauma or injury.  Insomnia - Working night shift.  Works Wed, Fri, Energy manager.  Tries to sleep at night on her days off.  Avoiding caffeine because of her heart condition after placement. She says insomnia started only about 2 weeks ago. No other stressors or changes that she is aware of. She's been working the night shift much longer than that. She says this is very disruptive. She will wake up after couple hours and then can't go back to sleep for couple hours and will finally fall asleep again for a couple of hours.  Review of Systems     Objective:   Physical Exam  Constitutional: She is oriented to person, place, and time. She appears well-developed and well-nourished.  Musculoskeletal:       She is tender along the medial edge of the right heel. She is nontender laterally or ports the arch. She is nontender over the medial or lateral malleolus. No excessive laxity of the ankle joint. Strength is 5 over 5 in all directions. No active rash or swelling.  Neurological: She is alert and oriented to person, place, and time.  Skin: Skin is warm and dry.  Psychiatric: She has a normal mood and affect. Her behavior is normal.          Assessment & Plan:  Right heel pain - will get x-ray today to evaluate for possible healed fracture or heel spur. For now make sure wearing good supportive comfortable shoes with a good arch support. She does stand on her feet for most of her job. Also recommended compression stockings and she is starting to notice some trace  swelling in her legs when she works.  Shift sleep disorder-she is most interested in non-medication treatments. Unfortunately I do not have a lot of knowledge in this area so we'll go ahead and refer her to Dr. Caren Hazy in [facility] and is a sleep disorder specialist.

## 2011-08-03 ENCOUNTER — Other Ambulatory Visit: Payer: Self-pay | Admitting: Family Medicine

## 2011-08-18 ENCOUNTER — Encounter: Payer: Self-pay | Admitting: Family Medicine

## 2011-08-27 DIAGNOSIS — Z7901 Long term (current) use of anticoagulants: Secondary | ICD-10-CM | POA: Insufficient documentation

## 2011-08-27 DIAGNOSIS — I099 Rheumatic heart disease, unspecified: Secondary | ICD-10-CM | POA: Insufficient documentation

## 2011-08-27 DIAGNOSIS — T50995A Adverse effect of other drugs, medicaments and biological substances, initial encounter: Secondary | ICD-10-CM | POA: Insufficient documentation

## 2011-08-27 DIAGNOSIS — IMO0001 Reserved for inherently not codable concepts without codable children: Secondary | ICD-10-CM | POA: Insufficient documentation

## 2011-08-31 ENCOUNTER — Encounter: Payer: Self-pay | Admitting: Family Medicine

## 2011-10-13 DIAGNOSIS — I059 Rheumatic mitral valve disease, unspecified: Secondary | ICD-10-CM | POA: Insufficient documentation

## 2012-03-07 ENCOUNTER — Other Ambulatory Visit: Payer: Self-pay | Admitting: *Deleted

## 2012-03-07 MED ORDER — ROSUVASTATIN CALCIUM 10 MG PO TABS: 10.0000 mg | ORAL_TABLET | Freq: Every day | ORAL | Status: DC

## 2012-03-07 MED ORDER — QUINAPRIL-HYDROCHLOROTHIAZIDE 10-12.5 MG PO TABS: 1.0000 | ORAL_TABLET | Freq: Every day | ORAL | Status: DC

## 2012-03-08 ENCOUNTER — Telehealth: Payer: Self-pay | Admitting: *Deleted

## 2012-03-08 DIAGNOSIS — I10 Essential (primary) hypertension: Secondary | ICD-10-CM

## 2012-03-08 MED ORDER — QUINAPRIL-HYDROCHLOROTHIAZIDE 10-12.5 MG PO TABS: 1.0000 | ORAL_TABLET | Freq: Every day | ORAL | Status: DC

## 2012-03-15 ENCOUNTER — Other Ambulatory Visit: Payer: Self-pay | Admitting: *Deleted

## 2012-03-15 ENCOUNTER — Encounter: Payer: Self-pay | Admitting: Family Medicine

## 2012-03-15 ENCOUNTER — Ambulatory Visit (INDEPENDENT_AMBULATORY_CARE_PROVIDER_SITE_OTHER): Payer: 59 | Admitting: Family Medicine

## 2012-03-15 VITALS — BP 104/68 | HR 74 | Ht 62.0 in | Wt 151.0 lb

## 2012-03-15 DIAGNOSIS — E119 Type 2 diabetes mellitus without complications: Secondary | ICD-10-CM

## 2012-03-15 DIAGNOSIS — Z954 Presence of other heart-valve replacement: Secondary | ICD-10-CM

## 2012-03-15 DIAGNOSIS — E785 Hyperlipidemia, unspecified: Secondary | ICD-10-CM

## 2012-03-15 DIAGNOSIS — I10 Essential (primary) hypertension: Secondary | ICD-10-CM

## 2012-03-15 DIAGNOSIS — Z9889 Other specified postprocedural states: Secondary | ICD-10-CM

## 2012-03-15 DIAGNOSIS — R7301 Impaired fasting glucose: Secondary | ICD-10-CM

## 2012-03-15 MED ORDER — QUINAPRIL HCL 10 MG PO TABS: 10.0000 mg | ORAL_TABLET | Freq: Every day | ORAL | Status: DC

## 2012-03-15 NOTE — Patient Instructions (Addendum)
Call me when low on your quinapril and will stop your diuretic.

## 2012-03-15 NOTE — Progress Notes (Signed)
  Subjective:    Patient ID: Erin Hill, female    DOB: 1955-09-14, 57 y.o.   MRN: [id number]  HPI IFG - Sugars 108-130.  She is diet controlle.d ecently has cut out her carbs and concentrated sugars. No hypoglycemic events. No regular exercise.  HTN - Home BPs in the 120s/60-70s.  Pt denies chest pain, SOB, dizziness, or heart palpitations.  Taking meds as directed w/o problems.  Denies medication side effects.  5 min spent with pt.   Review of Systems     Objective:   Physical Exam  Constitutional: She is oriented to person, place, and time. She appears well-developed and well-nourished.  HENT:  Head: Normocephalic and atraumatic.  Cardiovascular: Normal rate, regular rhythm and normal heart sounds.        Harsh systolic 2/6 ejection murmur best heard at the last border. She has a more low 3/6 ejection murmur heard at the right sternal border. She has a sharp S1 click best heard at the left sternal border.  Pulmonary/Chest: Effort normal and breath sounds normal.  Neurological: She is alert and oriented to person, place, and time.  Skin: Skin is warm and dry.  Psychiatric: She has a normal mood and affect. Her behavior is normal.          Assessment & Plan:  IFG - A1C is 6.1 today.  Doing great on new regimen.  She's made some great dietary changes by cutting out concentrated sweets and cards. She admits she's addicted to cards. Continue current regimen and visual continuous a few more pounds. This is fantastic. Followup in 6 months.  HTN - Well controlled.  BP is a little low.  Will stop her diuretic since she has a hard time drinking even 32 ounces a day. She just filled her 90 day supply and doesn't want to waste it.  She can call when she completes current prescription and we'll change her to quinapril by itself. Followup in 6 months. Call if feeling lightheaded or dizzy especially she continues to lose weight. Encourage her to continue work on trying to get water in on a daily  basis.  Hyperlipidemia - Now on 10mg  of crestor instead of 1.5 tabs (15mg ).  She's been out for couple days because she's been waiting on her mail-order shipment. Encouraged her restart her statin for 1-2 weeks and then go to the eye. Given lab slip today.  Aortic and mitral valve replacement-her Coumadin is currently being followed by her cardiologist. She was supratherapeutic and they have adjusted her Coumadin. [name] was 4.1

## 2012-04-03 IMAGING — CT CT ABD-PELV W/ CM
2 of 5 series · 17 of 46 positions shown, 19 images · IV contrast (READICAT/WATER & OMNI 300/[ID])
Comparison: None.

CLINICAL DATA: Right lower quadrant abdominal pain.

CT ABDOMEN AND PELVIS WITH CONTRAST
TECHNIQUE: Multidetector CT imaging of the abdomen and pelvis was
performed following the standard protocol during bolus
administration of intravenous contrast.
Contrast: 100 ml [id number]

[Series 2: abdomen w/ · axial · 0.70mm/px · z∈[-390,-15]mm · 14 of 86 slices shown, 16 images]
[im 6/86  soft-tissue]
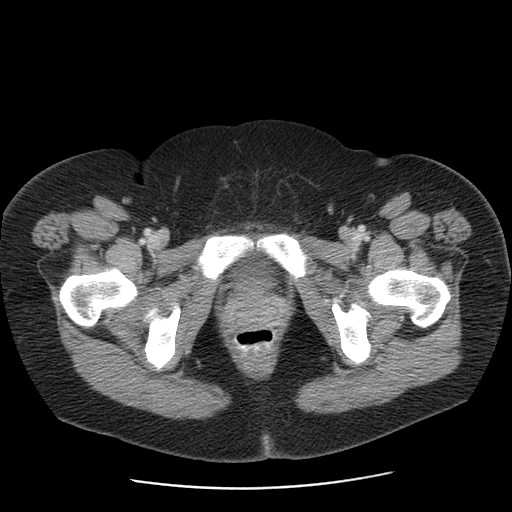
[im 6/86  bone]
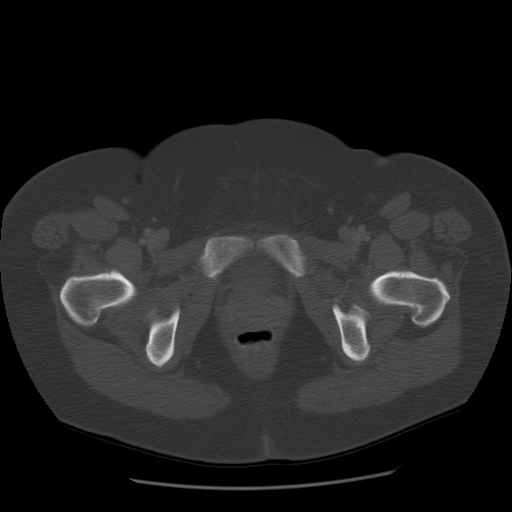
[im 11/86  soft-tissue]
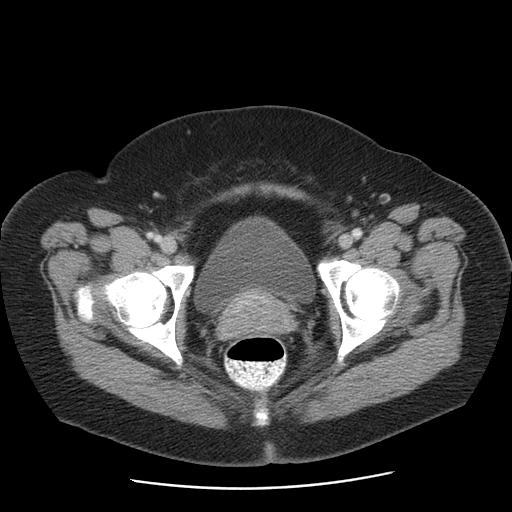
[im 16/86  soft-tissue]
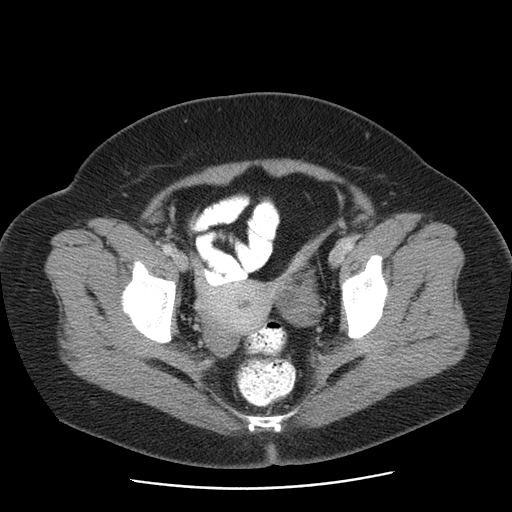
[im 26/86  soft-tissue]
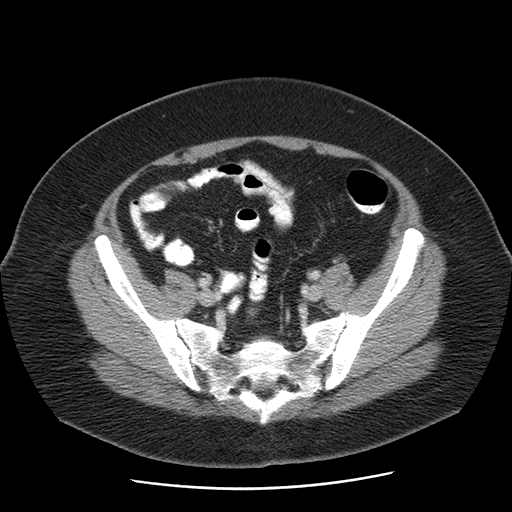
[im 31/86  soft-tissue]
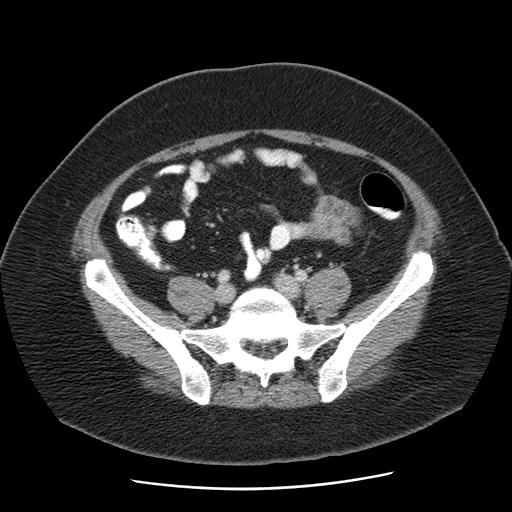
[im 36/86  soft-tissue]
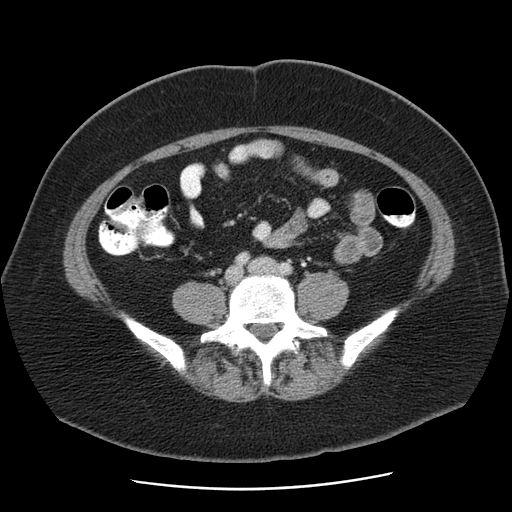
[im 41/86  soft-tissue]
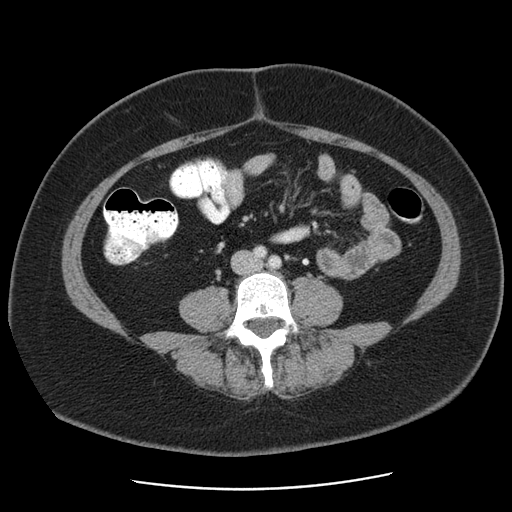
[im 46/86  soft-tissue]
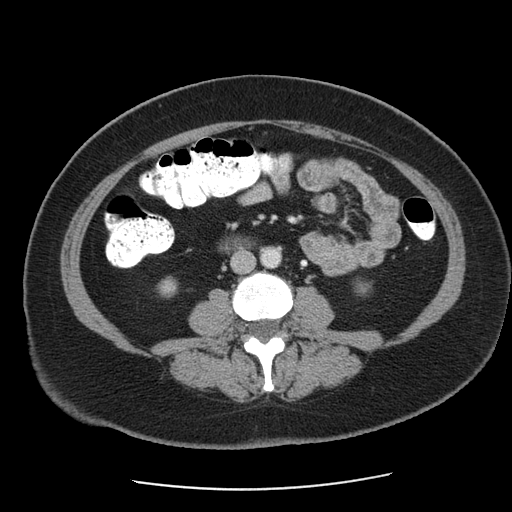
[im 51/86  soft-tissue]
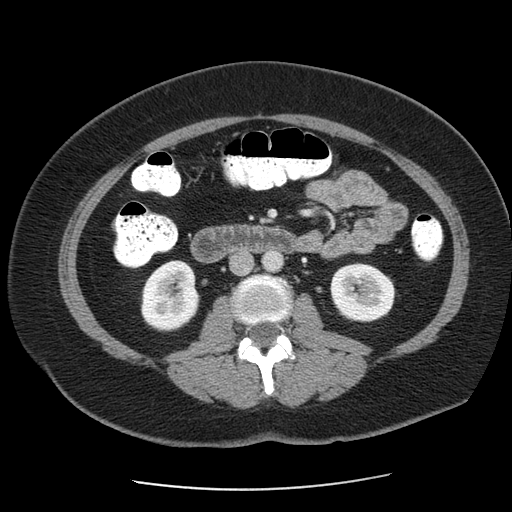
[im 51/86  bone]
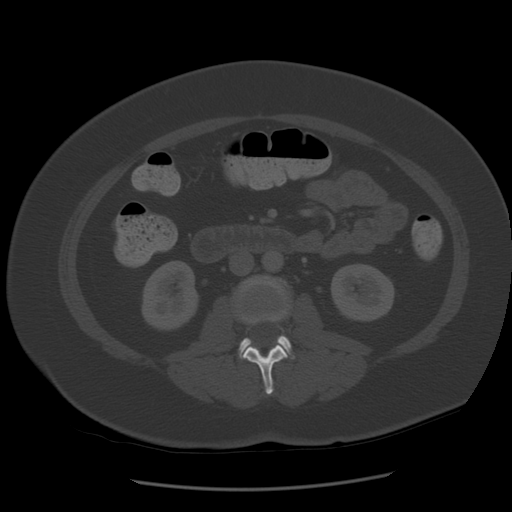
[im 56/86  soft-tissue]
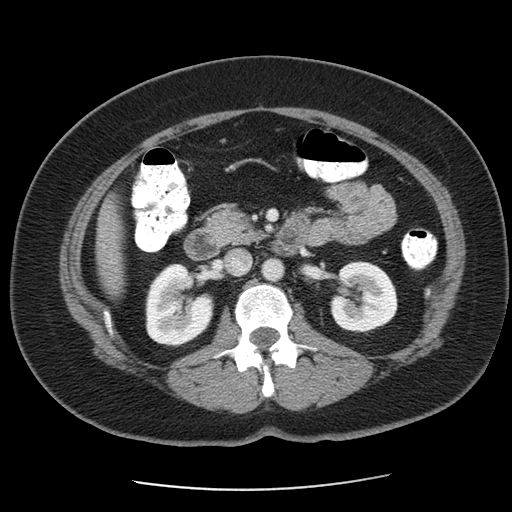
[im 66/86  soft-tissue]
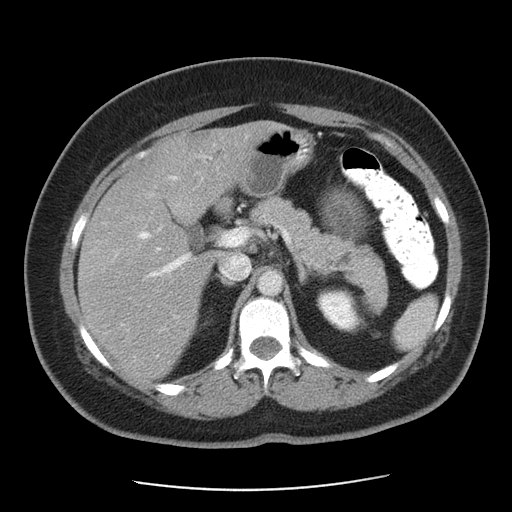
[im 71/86  soft-tissue]
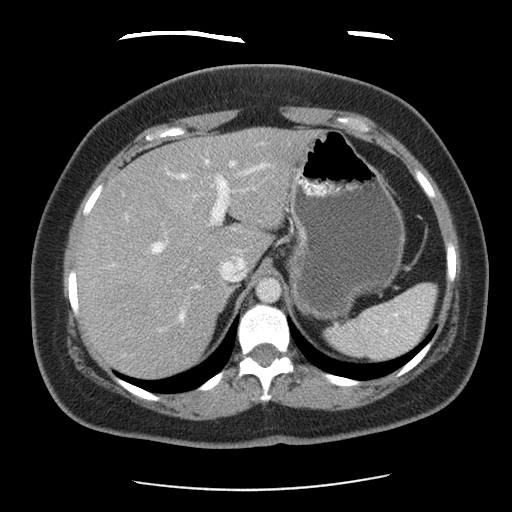
[im 76/86  soft-tissue]
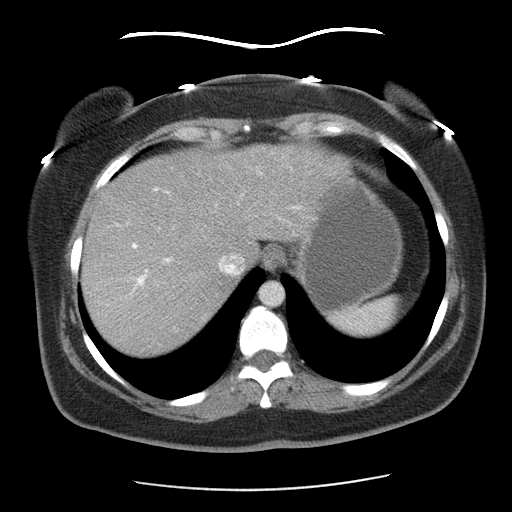
[im 81/86  soft-tissue]
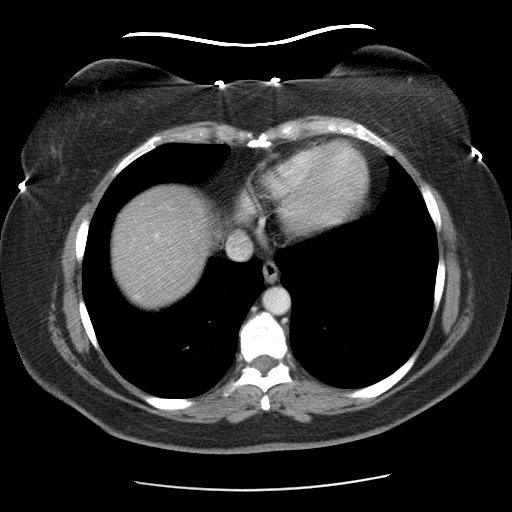

[Series 401: cor · coronal · 0.88mm/px · 3 of 125 slices shown]
[im 42/125  soft-tissue]
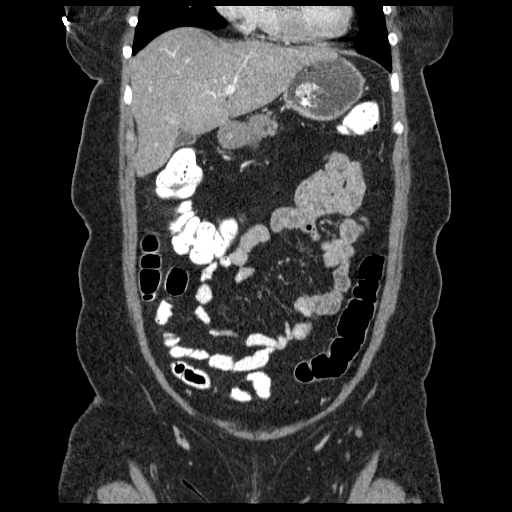
[im 56/125  soft-tissue]
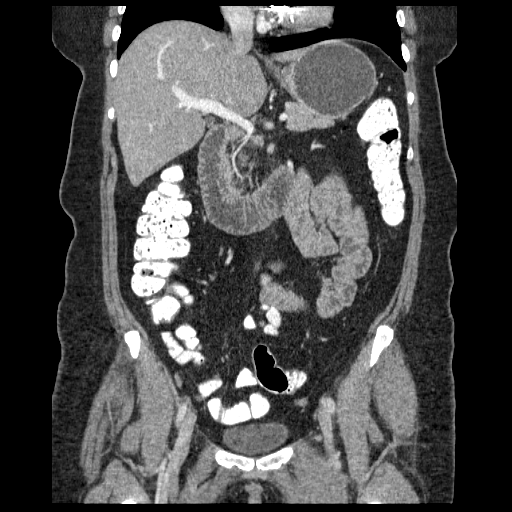
[im 69/125  soft-tissue]
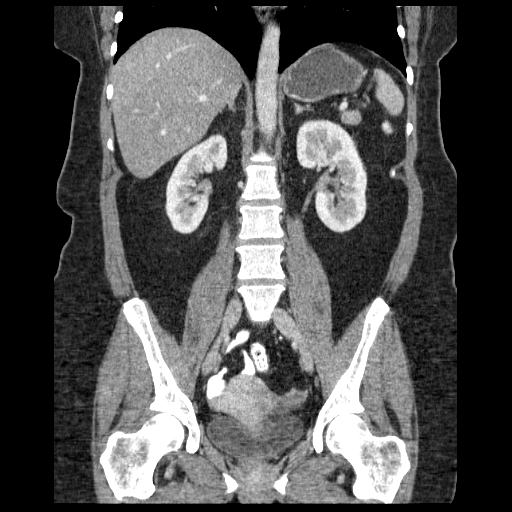

[17 of 46 positions shown; findings below may reference images not displayed]

FINDINGS: Mitral valve prosthesis noted.

Diffuse hepatic steatosis noted.  This is more confluent along the
falciform ligament.

The spleen, pancreas, and adrenal glands appear unremarkable.

The gallbladder and biliary system appear unremarkable.

A 1.2 x 0.8 cm hypodense left renal lesion on image 26 of series 2
is compatible with a simple cyst.

Orally administered contrast extends through to the colon.

The terminal ileum appears normal.  A small structure which may or
represent appendix in the right lower quadrant on images 64-63 of
series 401 does not appear inflamed.  No right lower quadrant
inflammatory process is identified to explain the patient's right-
sided pain.

The ovaries appear unremarkable.  Hypodensity in the cervix is
probably from a Nabothian cyst

No dilated bowel noted.  No free pelvic fluid identified.

Urinary bladder appears normal.

No renal calculus, hydronephrosis, or hydroureter.
IMPRESSION: 1.  Hepatic steatosis, with focal confluents along the falciform
ligament.
2.  No inflammatory process in the right abdomen is identified to
explain the patient's right abdominal pain.

## 2012-06-06 ENCOUNTER — Other Ambulatory Visit: Payer: Self-pay | Admitting: Family Medicine

## 2012-07-26 ENCOUNTER — Encounter: Payer: Self-pay | Admitting: Family Medicine

## 2012-07-26 ENCOUNTER — Ambulatory Visit (INDEPENDENT_AMBULATORY_CARE_PROVIDER_SITE_OTHER): Payer: 59 | Admitting: Family Medicine

## 2012-07-26 VITALS — BP 138/82 | HR 72 | Wt 147.0 lb

## 2012-07-26 DIAGNOSIS — M6530 Trigger finger, unspecified finger: Secondary | ICD-10-CM

## 2012-07-26 NOTE — Patient Instructions (Signed)
Trigger Finger  Trigger finger (digital tendinitis and stenosing tenosynovitis) is a common disorder that causes an often painful catching of the fingers or thumb. It occurs as a clicking, snapping or locking of a finger in the palm of the hand. The reason for this is that there is a problem with the tendons which flex the fingers sliding smoothly through their sheaths. The cause of this may be inflammation of the tendon and sheath, or from a thickening or nodule in the tendon. The condition may occur in any finger or a couple fingers at the same time. The cause may be overuse while doing the same activity over and over again with your hands.   Tendons are the tough cords that connect the muscles to bones. Muscles and tendons are part of the system which allows your body to move. When muscles contract in the forearm on the palm side, they pull the tendons toward the elbow and cause the fingers and thumb to bend (flex) toward the palm. These are the flexor tendons. The tendons slide through a slippery smooth membrane (synovium) which is called the tendon sheath. The sheaths have areas of tough fibrous tissues surrounding them which hold the tendons close to the bone. These are called pulleys because they work like a pulley. The first pulley is in the palm of the hand near the crease which runs across your palm. If the area of the tendon thickening is near the pulley, the tendon cannot slide smoothly through the pulley and this causes the trigger finger.  The finger may lock with the finger curled or suddenly straighten out with a snap. This is more common in patients with rheumatoid arthritis and diabetes. Left untreated, the condition may get worse to the point where the finger becomes locked in flexion, like making a fist, or less commonly locked with the finger straightened out.  DIAGNOSIS   Your caregiver will easily make this diagnosis on examination.  TREATMENT   · Splinting for 6 to 8 weeks of time may be  helpful. Use the splints as your caregiver suggests.  · Heat used for twenty minutes at least four times a day followed by ice packs for twenty minutes unless directed otherwise by your caregiver may be helpful. If you find either heat or cold seems to be making the problem worse, quit using them and ask your caregiver for directions.  · Cortisone injections along with splinting may speed up recovery. Several injections may be required. Cortisone may give relief after one injection.  · Only take over-the-counter or prescription medicines for pain, discomfort, or fever as directed by your caregiver.  · Surgery is another treatment that may be used if conservative treatments using injection and splinting does not work. Surgery can be minor without incisions (a cut does not have to be made) and can be done with a needle through the skin. No stitches are needed and most patients may return to work the same day.  · Other surgical choices involve an open procedure where the surgeon opens the hand through a small incision (cut) and cuts the pulley so the tendon can again slide smoothly. Your hand will still work fine. This small operation requires stitches and the recovery will be a little longer and the incisions will need to be protected until completely healed. You may have to limit your activities for up to 6 months.  · Occupational or hand therapy may be required if there is stiffness remaining in the finger.    RISKS AND COMPLICATIONS  Complications are uncommon but some problems that may occur are:  · Recurrence of the trigger finger. This does not mean that the surgery was not well done. It simply means that you may have formed scar tissue following surgery that causes the problem to reoccur.  · Infection which could ruin the results of the surgery and can result in a finger which is frozen and can not move normally.  · Nerve injury is possible which could result in permanent numbness of one or more fingers.  CARE  AFTER SURGERY  · Elevate your hand above your heart and use ice as instructed.  · Follow instructions regarding finger motion/exercise.  · Keep the surgical wound dry for at least 48 hrs or longer if instructed.  · Keep your follow-up appointments.  · Return to work and normal activities as instructed.  SEEK IMMEDIATE MEDICAL CARE IF:   Your problems are getting worse or you do not obtain relief from the treatment.  Document Released: 11/23/2003 Document Revised: 04/27/2011 Document Reviewed: 07/17/2008  [employer]® Patient Information ©2014 [employer].

## 2012-07-26 NOTE — Progress Notes (Signed)
  Subjective:    Patient ID: Erin Hill, female    DOB: 1955-02-19, 57 y.o.   MRN: [id number]  HPI Right hand middle finger is triggering x 2 months. 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  Says now painful and tender.  Says the PIP is mildly swollen as well.  Says did hit hr finger about a week ago and well and thinks that may have flared her pain. Can use tylenol for pain occ, but only if severe.    Review of Systems     Objective:   Physical Exam  Right hand, middle finger history draining. She does have significant pain with release. She does have some trace swelling of the DIP of that finger. Also some mild tenderness at the base of the finger over the palm. I'm unable to palpate a discrete nodule over the tendon.      Assessment & Plan:  Trigger finger. 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 I recommend injection for treatment and therapy. Her refer her to my partner Dr. Rodney Langton for further evaluation and treatment. In the meantime she can use Tylenol as needed. Recommend ovoid anti-inflammatories because of her Coumadin.

## 2012-08-01 ENCOUNTER — Ambulatory Visit: Payer: 59 | Admitting: Sports Medicine

## 2012-08-02 ENCOUNTER — Ambulatory Visit (INDEPENDENT_AMBULATORY_CARE_PROVIDER_SITE_OTHER): Payer: 59 | Admitting: Sports Medicine

## 2012-08-02 ENCOUNTER — Encounter: Payer: Self-pay | Admitting: Sports Medicine

## 2012-08-02 VITALS — BP 106/59 | HR 87 | Wt 150.0 lb

## 2012-08-02 DIAGNOSIS — M6530 Trigger finger, unspecified finger: Secondary | ICD-10-CM

## 2012-08-02 NOTE — Assessment & Plan Note (Signed)
Right third flexor digitorum sheath. Ultrasound-guided sheath injection as above. Stretches. Return in one month.

## 2012-08-02 NOTE — Progress Notes (Signed)
   Subjective:    I'm seeing this patient as a consultation for:  Dr. 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 She has done some home exercises, taking anti-inflammatories, but unfortunately has had persistent symptoms for 2 months. It is localized, doesn't radiate, moderate to severe.  Past medical history, Surgical history, Family history not pertinant except as noted below, Social history, Allergies, and medications have been entered into the medical record, reviewed, and no changes needed.   Review of Systems: No headache, visual changes, nausea, vomiting, diarrhea, constipation, dizziness, abdominal pain, skin rash, fevers, chills, night sweats, weight loss, swollen lymph nodes, body aches, joint swelling, muscle aches, chest pain, shortness of breath, mood changes, visual or auditory hallucinations.   Objective:   General: Well Developed, well nourished, and in no acute distress.  Neuro/Psych: Alert and oriented x3, extra-ocular muscles intact, able to move all 4 extremities, sensation grossly intact. Skin: Warm and dry, no rashes noted.  Respiratory: Not using accessory muscles, speaking in full sentences, trachea midline.  Cardiovascular: Pulses palpable, no extremity edema. Abdomen: Does not appear distended. Right hand: There is triggering of the middle finger, and there is also a palpable nodule at the flexor digitorum tendon just proximal to the A1 pulley.  Procedure: Real-time Ultrasound Guided Injection of right third flexor digitorum sheath Device: GE Logiq E  Verbal informed consent obtained.  Time-out conducted.  Noted no overlying erythema, induration, or other signs of local infection.  Skin prepped in a sterile fashion.  Local anesthesia: Topical Ethyl chloride.  With sterile technique and under real time ultrasound guidance:   25-gauge needle advanced and short axis into the flexor digitorum sheath just superficial to the flexor digitorum superficialis. 0.5 cc Kenalog 40, 2 cc lidocaine injected easily. Completed without difficulty  Pain immediately resolved suggesting accurate placement of the medication.  Advised to call if fevers/chills, erythema, induration, drainage, or persistent bleeding.  Images permanently stored and available for review in the ultrasound unit.  Impression: Technically successful ultrasound guided injection.  Impression and Recommendations:   This case required medical decision making of moderate complexity.

## 2012-08-31 ENCOUNTER — Ambulatory Visit: Payer: 59 | Admitting: Sports Medicine

## 2012-09-01 ENCOUNTER — Ambulatory Visit (INDEPENDENT_AMBULATORY_CARE_PROVIDER_SITE_OTHER): Payer: 59 | Admitting: Sports Medicine

## 2012-09-01 ENCOUNTER — Encounter: Payer: Self-pay | Admitting: Sports Medicine

## 2012-09-01 VITALS — BP 140/63 | HR 74 | Wt 152.0 lb

## 2012-09-01 DIAGNOSIS — M6530 Trigger finger, unspecified finger: Secondary | ICD-10-CM

## 2012-09-01 NOTE — Progress Notes (Signed)
  Subjective:    CC: Follow up  HPI: Right third trigger finger: I injected the right third flexor tendon sheath of the last visit a month ago, she returns today with triggering and pain completely resolved and is happy with results.  Past medical history, Surgical history, Family history not pertinant except as noted below, Social history, Allergies, and medications have been entered into the medical record, reviewed, and no changes needed.   Review of Systems: No fevers, chills, night sweats, weight loss, chest pain, or shortness of breath.   Objective:    General: Well Developed, well nourished, and in no acute distress.  Neuro: Alert and oriented x3, extra-ocular muscles intact, sensation grossly intact.  HEENT: Normocephalic, atraumatic, pupils equal round reactive to light, neck supple, no masses, no lymphadenopathy, thyroid nonpalpable.  Skin: Warm and dry, no rashes. Cardiac: Regular rate and rhythm, no murmurs rubs or gallops, no lower extremity edema.  Respiratory: Clear to auscultation bilaterally. Not using accessory muscles, speaking in full sentences. Right hand: Flexor digitorum nodule is still palpable but there is no triggering in no pain.  Impression and Recommendations:

## 2012-09-01 NOTE — Assessment & Plan Note (Signed)
Pain and triggering resolved after right third flexor tendon sheath injection one month ago. Return on an as-needed basis.

## 2012-12-22 ENCOUNTER — Other Ambulatory Visit: Payer: Self-pay

## 2012-12-22 ENCOUNTER — Other Ambulatory Visit: Payer: Self-pay | Admitting: *Deleted

## 2012-12-22 MED ORDER — ROSUVASTATIN CALCIUM 10 MG PO TABS: ORAL_TABLET | ORAL | Status: DC

## 2012-12-29 ENCOUNTER — Other Ambulatory Visit: Payer: Self-pay | Admitting: *Deleted

## 2012-12-29 MED ORDER — ROSUVASTATIN CALCIUM 10 MG PO TABS: ORAL_TABLET | ORAL | Status: DC

## 2013-02-23 DIAGNOSIS — I4892 Unspecified atrial flutter: Secondary | ICD-10-CM | POA: Insufficient documentation

## 2013-02-24 ENCOUNTER — Other Ambulatory Visit: Payer: Self-pay | Admitting: Family Medicine

## 2013-03-09 ENCOUNTER — Encounter: Payer: Self-pay | Admitting: Family Medicine

## 2013-03-09 DIAGNOSIS — I272 Pulmonary hypertension, unspecified: Secondary | ICD-10-CM | POA: Insufficient documentation

## 2013-04-10 ENCOUNTER — Encounter: Payer: 59 | Admitting: Family Medicine

## 2013-04-21 ENCOUNTER — Encounter: Payer: Self-pay | Admitting: Family Medicine

## 2013-04-21 ENCOUNTER — Ambulatory Visit (INDEPENDENT_AMBULATORY_CARE_PROVIDER_SITE_OTHER): Payer: Managed Care, Other (non HMO) | Admitting: Family Medicine

## 2013-04-21 VITALS — BP 100/55 | HR 100 | Temp 100.6°F | Resp 18 | Wt 151.0 lb

## 2013-04-21 DIAGNOSIS — R509 Fever, unspecified: Secondary | ICD-10-CM

## 2013-04-21 DIAGNOSIS — J101 Influenza due to other identified influenza virus with other respiratory manifestations: Secondary | ICD-10-CM

## 2013-04-21 DIAGNOSIS — J111 Influenza due to unidentified influenza virus with other respiratory manifestations: Secondary | ICD-10-CM

## 2013-04-21 LAB — POCT INFLUENZA A/B
Influenza A, POC: POSITIVE
Influenza B, POC: NEGATIVE

## 2013-04-21 MED ORDER — OSELTAMIVIR PHOSPHATE 75 MG PO CAPS: 75.0000 mg | ORAL_CAPSULE | Freq: Two times a day (BID) | ORAL | Status: DC

## 2013-04-21 NOTE — Progress Notes (Signed)
CC: Erin Hill is a 58 y.o. female is here for Fever and Generalized Body Aches   Subjective: HPI:  Complains of body aches with fever 102.0 accompanied by a nonproductive cough that has been present for 24 hours came on abruptly has not been getting better or worsens onset other than fever reduced by Tylenol pain improved by Tylenol. No known sick contacts. Symptoms are present all hours of the day not interfering with sleep. No interventions other than above. Denies confusion, rash, shortness of breath, wheezing, vomiting, diarrhea, abdominal pain   Review Of Systems Outlined In HPI  Past Medical History  Diagnosis Date  . Hearing loss     chronic  . Proteinuria     chronic (not related to DM)  . Rheumatic heart disease     s/p aortic/ mitral valve replace  . Miscarriage     X 2    Past Surgical History  Procedure Laterality Date  . Double heart valve replacement  1999  . Echo ef 75-80%    . Stapes surgery, left  2003    then removed 2 years later  . Tab       X 1  . Sab      X 2  . Tonsillectomy  as a child   Family History  Problem Relation Age of Onset  . Diabetes Father   . Lung cancer Mother     smoked until her 2640's  . Hypertension Mother   . Diabetes Father   . Hyperlipidemia Father   . Hypertension Father     History   Social History  . Marital Status: Married    Spouse Name: Theron Aristaeter    Number of Children: N/A  . Years of Education: N/A   Occupational History  . AUTHORIZATIONS    Social History Main Topics  . Smoking status: Never Smoker   . Smokeless tobacco: Not on file  . Alcohol Use: No  . Drug Use: No  . Sexual Activity: Yes    Partners: Male   Other Topics Concern  . Not on file   Social History Narrative   No regular exercise.       Objective: BP 100/55  Pulse 100  Temp(Src) 100.6 F (38.1 C) (Oral)  Resp 18  Wt 151 lb (68.493 kg)  SpO2 97%  General: Alert and Oriented, No Acute Distress HEENT: Pupils equal, round,  reactive to light. Conjunctivae clear.  External ears unremarkable, canals clear with intact TMs with appropriate landmarks.  Middle ear appears open without effusion. Pink inferior turbinates with moderate mucoid discharge.  Moist mucous membranes, pharynx without inflammation nor lesions.  Neck supple without palpable lymphadenopathy nor abnormal masses. Lungs: Clear to auscultation bilaterally, no wheezing/ronchi/rales.  Comfortable work of breathing. Good air movement. Extremities: No peripheral edema.  Strong peripheral pulses.  Mental Status: No depression, anxiety, nor agitation. Skin: Warm and dry.  Assessment & Plan: Erin Hill was seen today for fever and generalized body aches.  Diagnoses and associated orders for this visit:  Fever - POCT Influenza A/B  Influenza A - oseltamivir (TAMIFLU) 75 MG capsule; Take 1 capsule (75 mg total) by mouth 2 (two) times daily.    Rapid influenza positive for type A, start Tamiflu, use acetaminophen for symptomatic care of body aches and fever, stay well hydrated, consider DayQuil or NyQuil as needed keeping in mind acetaminophen component no more than 3 g a day  Return if symptoms worsen or fail to improve.

## 2013-04-23 ENCOUNTER — Other Ambulatory Visit: Payer: Self-pay | Admitting: Family Medicine

## 2013-04-25 ENCOUNTER — Other Ambulatory Visit: Payer: Self-pay | Admitting: Family Medicine

## 2013-04-25 ENCOUNTER — Other Ambulatory Visit: Payer: Self-pay | Admitting: *Deleted

## 2013-05-29 ENCOUNTER — Encounter: Payer: Managed Care, Other (non HMO) | Admitting: Family Medicine

## 2013-06-05 ENCOUNTER — Telehealth: Payer: Self-pay | Admitting: Family Medicine

## 2013-06-05 ENCOUNTER — Ambulatory Visit (INDEPENDENT_AMBULATORY_CARE_PROVIDER_SITE_OTHER): Payer: Managed Care, Other (non HMO) | Admitting: Family Medicine

## 2013-06-05 ENCOUNTER — Encounter: Payer: Self-pay | Admitting: Family Medicine

## 2013-06-05 ENCOUNTER — Ambulatory Visit (INDEPENDENT_AMBULATORY_CARE_PROVIDER_SITE_OTHER): Payer: Managed Care, Other (non HMO) | Admitting: Sports Medicine

## 2013-06-05 ENCOUNTER — Encounter: Payer: Self-pay | Admitting: Sports Medicine

## 2013-06-05 VITALS — BP 119/57 | HR 79 | Wt 150.0 lb

## 2013-06-05 VITALS — BP 128/59 | HR 71 | Ht 62.0 in | Wt 149.0 lb

## 2013-06-05 DIAGNOSIS — R7301 Impaired fasting glucose: Secondary | ICD-10-CM

## 2013-06-05 DIAGNOSIS — M25541 Pain in joints of right hand: Secondary | ICD-10-CM | POA: Insufficient documentation

## 2013-06-05 DIAGNOSIS — I493 Ventricular premature depolarization: Secondary | ICD-10-CM

## 2013-06-05 DIAGNOSIS — I4949 Other premature depolarization: Secondary | ICD-10-CM

## 2013-06-05 DIAGNOSIS — M6530 Trigger finger, unspecified finger: Secondary | ICD-10-CM

## 2013-06-05 DIAGNOSIS — Z1231 Encounter for screening mammogram for malignant neoplasm of breast: Secondary | ICD-10-CM

## 2013-06-05 DIAGNOSIS — M25549 Pain in joints of unspecified hand: Secondary | ICD-10-CM

## 2013-06-05 DIAGNOSIS — Z0000 Encounter for general adult medical examination without abnormal findings: Secondary | ICD-10-CM

## 2013-06-05 LAB — POCT GLYCOSYLATED HEMOGLOBIN (HGB A1C): Hemoglobin A1C: 5.4

## 2013-06-05 NOTE — Patient Instructions (Signed)
Keep up a regular exercise program and make sure you are eating a healthy diet Try to eat 4 servings of dairy a day, or if you are lactose intolerant take a calcium with vitamin D daily.  Your vaccines are up to date.   

## 2013-06-05 NOTE — Progress Notes (Addendum)
Subjective:     Erin Hill is a 58 y.o. female and is here for a comprehensive physical exam. The patient reports problems - wants me to look at some veins in her legs. REcently seen for atrial flutter. o spironolactone. . Overall she's doing well and physically feels well. She's not had any recurrence of palpitations or atrial flutter.  She is now on metoprolol and spironolactone. She did have some questions though. She noticed on my chart that pulmonary hypertension was mentioned. She said she has never been told that before.  History   Social History  . Marital Status: Married    Spouse Name: Theron Aristaeter    Number of Children: N/A  . Years of Education: N/A   Occupational History  . AUTHORIZATIONS    Social History Main Topics  . Smoking status: Never Smoker   . Smokeless tobacco: Not on file  . Alcohol Use: No  . Drug Use: No  . Sexual Activity: Yes    Partners: Male   Other Topics Concern  . Not on file   Social History Narrative   No regular exercise.     Health Maintenance  Topic Date Due  . Mammogram  02/22/2013  . Influenza Vaccine  09/16/2013  . Pap Smear  03/11/2014  . Tetanus/tdap  02/17/2015  . Colonoscopy  11/14/2016    The following portions of the patient's history were reviewed and updated as appropriate: allergies, current medications, past family history, past medical history, past social history, past surgical history and problem list.  Review of Systems A comprehensive review of systems was negative.   Objective:   She appears well, in no apparent distress.  Alert and oriented times three, pleasant and cooperative. Vital signs are as documented in vital signs section.    BP 119/57  Pulse 79  Wt 150 lb (68.04 kg) General appearance: alert, cooperative and appears stated age Head: Normocephalic, without obvious abnormality, atraumatic Eyes: conj clear, EOMI, PEERLA Ears: normal TM's and external ear canals both ears Nose: Nares normal. Septum  midline. Mucosa normal. No drainage or sinus tenderness. Throat: lips, mucosa, and tongue normal; teeth and gums normal Neck: no adenopathy, no carotid bruit, no JVD, supple, symmetrical, trachea midline and thyroid not enlarged, symmetric, no tenderness/mass/nodules Back: symmetric, no curvature. ROM normal. No CVA tenderness. Lungs: clear to auscultation bilaterally Heart: regular rate and rhythm and systolic murmur: holosystolic 5/6, sharp radiates to carotids. Also early beat every 4-6th beat.  Abdomen: soft, non-tender; bowel sounds normal; no masses,  no organomegaly Extremities: extremities normal, atraumatic, no cyanosis or edema Pulses: 2+ and symmetric Skin: Skin color, texture, turgor normal. No rashes or lesions Lymph nodes: Cervical, supraclavicular, and axillary nodes normal. Neurologic: Alert and oriented X 3, normal strength and tone. Normal symmetric reflexes. Normal coordination and gait     Assessment:    Healthy female exam.     Plan:     See After Visit Summary for Counseling Recommendations  Keep up a regular exercise program and make sure you are eating a healthy diet Try to eat 4 servings of dairy a day, or if you are lactose intolerant take a calcium with vitamin D daily.  Your vaccines are up to date.   IFG - well controlled today at 5.4  Recheck in 1 year.   Mammogram is up-to-date. She had done last Friday. She has not heard back on the results yet. We do not have a copy.  Pulmonary hypertension-this was noted on her  echocardiogram done in January. I printed a copy for her and encouraged her to speak with her cardiologist about the results.

## 2013-06-05 NOTE — Assessment & Plan Note (Signed)
Through the same incision I was able to redirect the needle into the metacarpophalangeal joint, this was injected as well. Return as needed.

## 2013-06-05 NOTE — Assessment & Plan Note (Signed)
Repeat injection into the right third flexor tendon sheath, the last injection lasted for 10 months.

## 2013-06-05 NOTE — Telephone Encounter (Signed)
Call patient: Mammogram shows questionable area of the left breast. They recommend additional imaging of the left breast including possible ultrasound and possible spot compression views. They should be contacting you soon for mental health. If you have any problems or do not here from them by the end of the week then please let us know.

## 2013-06-05 NOTE — Progress Notes (Signed)
  Subjective:    CC: Followup  HPI: Finger pain: Approximately 15 months ago we performed an ultrasound guided trigger finger injection, she continues to do well, she does have some pain at the right third metacarpophalangeal joint as well as over the tendon sheath with mild triggering, moderate, persistent.  Past medical history, Surgical history, Family history not pertinant except as noted below, Social history, Allergies, and medications have been entered into the medical record, reviewed, and no changes needed.   Review of Systems: No fevers, chills, night sweats, weight loss, chest pain, or shortness of breath.   Objective:    General: Well Developed, well nourished, and in no acute distress.  Neuro: Alert and oriented x3, extra-ocular muscles intact, sensation grossly intact.  HEENT: Normocephalic, atraumatic, pupils equal round reactive to light, neck supple, no masses, no lymphadenopathy, thyroid nonpalpable.  Skin: Warm and dry, no rashes. Cardiac: Regular rate and rhythm, no murmurs rubs or gallops, no lower extremity edema.  Respiratory: Clear to auscultation bilaterally. Not using accessory muscles, speaking in full sentences. Right hand: There is a palpable flexor tendon nodule, there's also tenderness to palpation at the metacarpophalangeal joint itself.  Procedure: Real-time Ultrasound Guided Injection of right third flexor tendon sheath. 0.5 cc Kenalog 40, 2 cc lidocaine injected easily into the flexor tendon sheath. Device: GE Logiq E  Verbal informed consent obtained.  Time-out conducted.  Noted no overlying erythema, induration, or other signs of local infection.  Skin prepped in a sterile fashion.  Local anesthesia: Topical Ethyl chloride.  With sterile technique and under real time ultrasound guidance:   Completed without difficulty  Pain immediately resolved suggesting accurate placement of the medication.  Advised to call if fevers/chills, erythema, induration,  drainage, or persistent bleeding.  Images permanently stored and available for review in the ultrasound unit.  Impression: Technically successful ultrasound guided injection.  Procedure: Real-time Ultrasound Guided Injection of right third metacarpophalangeal joint Device: GE Logiq E  Verbal informed consent obtained.  Time-out conducted.  Noted no overlying erythema, induration, or other signs of local infection.  Skin prepped in a sterile fashion.  Local anesthesia: Topical Ethyl chloride.  With sterile technique and under real time ultrasound guidance:  Using the same needle entry, the needle was redirected into the metacarpophalangeal joint, from a volar approach a total of 0.5 cc Kenalog 40, 1 cc lidocaine injected easily. Completed without difficulty  Pain immediately resolved suggesting accurate placement of the medication.  Advised to call if fevers/chills, erythema, induration, drainage, or persistent bleeding.  Images permanently stored and available for review in the ultrasound unit.  Impression: Technically successful ultrasound guided injection.  Impression and Recommendations:

## 2013-06-06 NOTE — Telephone Encounter (Signed)
Pt called and informed.Erin Hill  

## 2013-06-13 ENCOUNTER — Telehealth: Payer: Self-pay | Admitting: *Deleted

## 2013-06-13 LAB — LIPID PANEL
CHOL/HDL RATIO: 2.7 ratio
CHOLESTEROL: 158 mg/dL (ref 0–200)
HDL: 59 mg/dL (ref >39)
LDL Cholesterol: 87 mg/dL (ref 0–99)
Triglycerides: 60 mg/dL (ref <150)
VLDL: 12 mg/dL (ref 0–40)

## 2013-06-13 LAB — COMPLETE METABOLIC PANEL WITH GFR
ALBUMIN: 4.6 g/dL (ref 3.5–5.2)
ALK PHOS: 60 U/L (ref 39–117)
ALT: 15 U/L (ref 0–35)
AST: 22 U/L (ref 0–37)
BUN: 23 mg/dL (ref 6–23)
CALCIUM: 10.1 mg/dL (ref 8.4–10.5)
CHLORIDE: 105 meq/L (ref 96–112)
CO2: 23 mEq/L (ref 19–32)
Creat: 0.74 mg/dL (ref 0.50–1.10)
GFR, Est African American: >89 mL/min
GFR, Est Non African American: >89 mL/min
Glucose, Bld: 108 mg/dL — ABNORMAL HIGH (ref 70–99)
POTASSIUM: 4.6 meq/L (ref 3.5–5.3)
SODIUM: 139 meq/L (ref 135–145)
TOTAL PROTEIN: 7.5 g/dL (ref 6.0–8.3)
Total Bilirubin: 0.6 mg/dL (ref 0.2–1.2)

## 2013-06-13 NOTE — Telephone Encounter (Signed)
Pt would like to know what she can take for head congestion since she is taking coumadin.Erin Hill

## 2013-06-13 NOTE — Telephone Encounter (Signed)
Can try coricidin Cold and Flu. Just keep tylenol dose under 2000mg  a day.  Or can use sudafed PE (OTC version of Sudafed) if just want to target the congestion.

## 2013-06-15 NOTE — Telephone Encounter (Signed)
Pt informed of recommendations.Erin Hill [name] [name]  

## 2013-06-26 ENCOUNTER — Encounter: Payer: Self-pay | Admitting: Family Medicine

## 2013-06-27 ENCOUNTER — Encounter: Payer: Self-pay | Admitting: Family Medicine

## 2013-08-23 ENCOUNTER — Encounter: Payer: Self-pay | Admitting: Family Medicine

## 2013-10-05 ENCOUNTER — Encounter: Payer: Self-pay | Admitting: Family Medicine

## 2013-12-01 ENCOUNTER — Other Ambulatory Visit: Payer: Self-pay | Admitting: Family Medicine

## 2013-12-01 DIAGNOSIS — R928 Other abnormal and inconclusive findings on diagnostic imaging of breast: Secondary | ICD-10-CM

## 2013-12-27 ENCOUNTER — Encounter: Payer: Self-pay | Admitting: Family Medicine

## 2013-12-29 ENCOUNTER — Encounter: Payer: Self-pay | Admitting: Family Medicine

## 2014-02-06 ENCOUNTER — Other Ambulatory Visit: Payer: Self-pay | Admitting: Family Medicine

## 2014-02-12 ENCOUNTER — Telehealth: Payer: Self-pay | Admitting: *Deleted

## 2014-02-12 MED ORDER — ROSUVASTATIN CALCIUM 10 MG PO TABS: 10.0000 mg | ORAL_TABLET | Freq: Every day | ORAL | Status: DC

## 2014-02-12 NOTE — Telephone Encounter (Signed)
Pt lvm stating that she only takes 1 10 mg of crestor and will need to get this corrected.Heath GoldBarkley, Valena Ivanov [name] New rx sent.Loralee PacasBarkley, Neveyah Garzon [place]

## 2014-02-14 ENCOUNTER — Telehealth: Payer: Self-pay | Admitting: *Deleted

## 2014-02-14 NOTE — Telephone Encounter (Signed)
Spoke w/Erin Hill to clarify crestor sig. Informed her that she is to take 1 tab QHS. She asked if she is to continue on Gemfibrozil bc of the contraindication. I spoke w/Dr. Linford ArnoldMetheney and informed her that according to her last OV w/Cards she was still taking. She said that this would be something that her cardiologist will need to address and for now leave it as is.  Pharmacy advised.Loralee PacasBarkley, Isabelle Matt [place]

## 2014-05-14 ENCOUNTER — Ambulatory Visit (INDEPENDENT_AMBULATORY_CARE_PROVIDER_SITE_OTHER): Payer: BLUE CROSS/BLUE SHIELD | Admitting: Family Medicine

## 2014-05-14 ENCOUNTER — Telehealth: Payer: Self-pay | Admitting: Family Medicine

## 2014-05-14 ENCOUNTER — Encounter: Payer: Self-pay | Admitting: Family Medicine

## 2014-05-14 VITALS — BP 120/63 | HR 65 | Wt 157.0 lb

## 2014-05-14 DIAGNOSIS — T50905A Adverse effect of unspecified drugs, medicaments and biological substances, initial encounter: Secondary | ICD-10-CM

## 2014-05-14 DIAGNOSIS — T887XXA Unspecified adverse effect of drug or medicament, initial encounter: Secondary | ICD-10-CM | POA: Diagnosis not present

## 2014-05-14 DIAGNOSIS — K2990 Gastroduodenitis, unspecified, without bleeding: Secondary | ICD-10-CM | POA: Diagnosis not present

## 2014-05-14 DIAGNOSIS — K2970 Gastritis, unspecified, without bleeding: Secondary | ICD-10-CM

## 2014-05-14 DIAGNOSIS — R1084 Generalized abdominal pain: Secondary | ICD-10-CM

## 2014-05-14 LAB — POCT URINALYSIS DIPSTICK
BILIRUBIN UA: NEGATIVE
Blood, UA: NEGATIVE
KETONES UA: NEGATIVE
Leukocytes, UA: NEGATIVE
Nitrite, UA: NEGATIVE
Protein, UA: NEGATIVE
SPEC GRAV UA: 1.025
Urobilinogen, UA: 0.2
pH, UA: 6

## 2014-05-14 MED ORDER — RANITIDINE HCL 150 MG PO TABS: 150.0000 mg | ORAL_TABLET | Freq: Two times a day (BID) | ORAL | Status: DC

## 2014-05-14 NOTE — Progress Notes (Signed)
   Subjective:    Patient ID: Erin Hill, female    DOB: 1955/03/03, 59 y.o.   MRN: [id number]  HPI Was started on spironolactone by Dr. Donnetta Simperseynaldo in August.  Then in Jan started getting heartburn.  Tried apple cider vinegar and then started TUMs. Says now when eats and then takes the medicine she feels like a cramping and burning sensation that radiates into her back. The would get dizzy and feel like she was going to pass out. Was also noticing some acne.  Skipped the medicine for 2 days and says feels so much better off the medicine.   med reaction to spironolactone ,kidney pain both sides,pressure & pain, dizzy, unsteady gait, tingling leg pain. Not sure if any blood in her urine.   Review of Systems     Objective:   Physical Exam  Constitutional: She is oriented to person, place, and time. She appears well-developed and well-nourished.  HENT:  Head: Normocephalic and atraumatic.  Cardiovascular: Normal rate, regular rhythm and normal heart sounds.   Pulmonary/Chest: Effort normal and breath sounds normal.  Abdominal: Soft. Bowel sounds are normal. She exhibits no distension and no mass. There is tenderness. There is no rebound and no guarding.  Mild epigastric tenderness.   Musculoskeletal:  No CVA tenderness   Neurological: She is alert and oriented to person, place, and time.  Skin: Skin is warm and dry.  Psychiatric: She has a normal mood and affect. Her behavior is normal.          Assessment & Plan:  Medication reaction - she's stopped her spironolactone for 2 days and feels significantly better. She certainly could have some gastritis on going to put her on ranitidine twice a day for the next 2 weeks. We did do a urinalysis which showed 100 mg/dL of glucose. We will need to test her for diabetes. In addition we did check kidney function and electrolytes. Make sure hydrating well. Continue to hold small lactone for now. Her blood pressure looks fantastic today.

## 2014-05-14 NOTE — Telephone Encounter (Signed)
Please call patient and let her know that her urinalysis did show that she is spilling glucose into her urine. We need to actually test her for diabetes. Can have her come in for a nurse visit this week if she would like.

## 2014-05-15 LAB — COMPLETE METABOLIC PANEL WITH GFR
ALT: 30 U/L (ref 0–35)
AST: 33 U/L (ref 0–37)
Albumin: 4.2 g/dL (ref 3.5–5.2)
Alkaline Phosphatase: 80 U/L (ref 39–117)
BILIRUBIN TOTAL: 0.4 mg/dL (ref 0.2–1.2)
BUN: 12 mg/dL (ref 6–23)
CHLORIDE: 107 meq/L (ref 96–112)
CO2: 23 mEq/L (ref 19–32)
Calcium: 9.9 mg/dL (ref 8.4–10.5)
Creat: 0.84 mg/dL (ref 0.50–1.10)
GFR, EST AFRICAN AMERICAN: 89 mL/min
GFR, Est Non African American: 77 mL/min
Glucose, Bld: 101 mg/dL — ABNORMAL HIGH (ref 70–99)
Potassium: 4.4 mEq/L (ref 3.5–5.3)
Sodium: 141 mEq/L (ref 135–145)
Total Protein: 6.9 g/dL (ref 6.0–8.3)

## 2014-05-15 LAB — CBC
HEMATOCRIT: 36 % (ref 36.0–46.0)
Hemoglobin: 12.6 g/dL (ref 12.0–15.0)
MCH: 30.3 pg (ref 26.0–34.0)
MCHC: 35 g/dL (ref 30.0–36.0)
MCV: 86.5 fL (ref 78.0–100.0)
MPV: 11.3 fL (ref 8.6–12.4)
Platelets: 322 10*3/uL (ref 150–400)
RBC: 4.16 MIL/uL (ref 3.87–5.11)
RDW: 14.7 % (ref 11.5–15.5)
WBC: 12.9 10*3/uL — AB (ref 4.0–10.5)

## 2014-05-15 NOTE — Telephone Encounter (Signed)
Called pt and lvm informing her of this.Erin PacasBarkley, Erin Bagot [place]

## 2014-05-21 ENCOUNTER — Ambulatory Visit (INDEPENDENT_AMBULATORY_CARE_PROVIDER_SITE_OTHER): Payer: BLUE CROSS/BLUE SHIELD | Admitting: Physician Assistant

## 2014-05-21 VITALS — BP 145/65 | HR 77 | Temp 98.1°F | Ht 62.0 in | Wt 161.0 lb

## 2014-05-21 DIAGNOSIS — R81 Glycosuria: Secondary | ICD-10-CM | POA: Diagnosis not present

## 2014-05-21 DIAGNOSIS — Z131 Encounter for screening for diabetes mellitus: Secondary | ICD-10-CM | POA: Diagnosis not present

## 2014-05-21 LAB — POCT GLYCOSYLATED HEMOGLOBIN (HGB A1C)

## 2014-05-21 NOTE — Progress Notes (Signed)
   Subjective:    Patient ID: Erin Hill, female    DOB: 07-31-1955, 59 y.o.   MRN: [id number]  HPI Patient came in for nurse visit A1c check and WBC check.    Review of Systems     Objective:   Physical Exam        Assessment & Plan:  Pt's POCT A1c was completed but was too high for our machine to read. Pt went to lab for CBC and A1c blood draw. Advised we would contact her with results. No further questions.

## 2014-05-22 ENCOUNTER — Encounter: Payer: Self-pay | Admitting: Physician Assistant

## 2014-05-22 DIAGNOSIS — R7303 Prediabetes: Secondary | ICD-10-CM | POA: Insufficient documentation

## 2014-05-22 LAB — CBC WITH DIFFERENTIAL/PLATELET
BASOS PCT: 1 % (ref 0–1)
Basophils Absolute: 0.1 10*3/uL (ref 0.0–0.1)
EOS ABS: 4.4 10*3/uL — AB (ref 0.0–0.7)
EOS PCT: 38 % — AB (ref 0–5)
HCT: 35.6 % — ABNORMAL LOW (ref 36.0–46.0)
Hemoglobin: 12 g/dL (ref 12.0–15.0)
Lymphocytes Relative: 23 % (ref 12–46)
Lymphs Abs: 2.7 10*3/uL (ref 0.7–4.0)
MCH: 29.9 pg (ref 26.0–34.0)
MCHC: 33.7 g/dL (ref 30.0–36.0)
MCV: 88.6 fL (ref 78.0–100.0)
MPV: 12.1 fL (ref 8.6–12.4)
Monocytes Absolute: 0.7 10*3/uL (ref 0.1–1.0)
Monocytes Relative: 6 % (ref 3–12)
Neutro Abs: 3.7 10*3/uL (ref 1.7–7.7)
Neutrophils Relative %: 32 % — ABNORMAL LOW (ref 43–77)
PLATELETS: 316 10*3/uL (ref 150–400)
RBC: 4.02 MIL/uL (ref 3.87–5.11)
RDW: 15.4 % (ref 11.5–15.5)
WBC: 11.6 10*3/uL — ABNORMAL HIGH (ref 4.0–10.5)

## 2014-05-22 LAB — HEMOGLOBIN A1C
HEMOGLOBIN A1C: 6.4 % — AB (ref <5.7)
Mean Plasma Glucose: 137 mg/dL — ABNORMAL HIGH (ref <117)

## 2014-05-23 ENCOUNTER — Other Ambulatory Visit: Payer: Self-pay | Admitting: Family Medicine

## 2014-05-23 MED ORDER — GLUCOSE BLOOD VI STRP: ORAL_STRIP | Status: DC

## 2014-05-23 NOTE — Telephone Encounter (Signed)
Pt states she takes her blood sugar at home and is out of test strips. We have not ordered these for her before. Pt reports using a One Touch Ultra Mini glucometer. If Rx is appropriate, would [facility].

## 2014-05-23 NOTE — Telephone Encounter (Signed)
We can try sending a prescription that most insurance companies will not cover the strips unless she has diabetes. They will not pay for them for prediabetes or impaired fasting glucose.

## 2014-06-11 ENCOUNTER — Telehealth: Payer: Self-pay | Admitting: *Deleted

## 2014-06-11 ENCOUNTER — Encounter: Payer: Self-pay | Admitting: Family Medicine

## 2014-06-11 ENCOUNTER — Other Ambulatory Visit (HOSPITAL_COMMUNITY)
Admission: RE | Admit: 2014-06-11 | Discharge: 2014-06-11 | Disposition: A | Discharge status: Home / Self Care | Payer: BLUE CROSS/BLUE SHIELD | Source: Ambulatory Visit | Attending: Family Medicine | Admitting: Family Medicine

## 2014-06-11 ENCOUNTER — Ambulatory Visit (INDEPENDENT_AMBULATORY_CARE_PROVIDER_SITE_OTHER): Payer: BLUE CROSS/BLUE SHIELD | Admitting: Family Medicine

## 2014-06-11 VITALS — BP 106/54 | HR 73 | Ht 62.0 in | Wt 155.0 lb

## 2014-06-11 DIAGNOSIS — Z01419 Encounter for gynecological examination (general) (routine) without abnormal findings: Secondary | ICD-10-CM | POA: Insufficient documentation

## 2014-06-11 DIAGNOSIS — I10 Essential (primary) hypertension: Secondary | ICD-10-CM | POA: Diagnosis not present

## 2014-06-11 DIAGNOSIS — Z1151 Encounter for screening for human papillomavirus (HPV): Secondary | ICD-10-CM | POA: Insufficient documentation

## 2014-06-11 DIAGNOSIS — E785 Hyperlipidemia, unspecified: Secondary | ICD-10-CM

## 2014-06-11 DIAGNOSIS — R928 Other abnormal and inconclusive findings on diagnostic imaging of breast: Secondary | ICD-10-CM

## 2014-06-11 NOTE — Patient Instructions (Signed)
Potassium Content of Foods  Potassium is a mineral found in many foods and drinks. It helps keep fluids and minerals balanced in your body and affects how steadily your heart beats. Potassium also helps control your blood pressure and keep your muscles and nervous system healthy.  Certain health conditions and medicines may change the balance of potassium in your body. When this happens, you can help balance your level of potassium through the foods that you do or do not eat. Your health care provider or dietitian may recommend an amount of potassium that you should have each day. The following lists of foods provide the amount of potassium (in parentheses) per serving in each item.  HIGH IN POTASSIUM   The following foods and beverages have 200 mg or more of potassium per serving:  · Apricots, 2 raw or 5 dry (200 mg).  · Artichoke, 1 medium (345 mg).  · Avocado, raw,  ¼ each (245 mg).  · Banana, 1 medium (425 mg).  · Beans, lima, or baked beans, canned, ½ cup (280 mg).  · Beans, white, canned, ½ cup (595 mg).  · Beef roast, 3 oz (320 mg).  · Beef, ground, 3 oz (270 mg).  · Beets, raw or cooked, ½ cup (260 mg).  · Bran muffin, 2 oz (300 mg).  · Broccoli, ½ cup (230 mg).  · Brussels sprouts, ½ cup (250 mg).  · Cantaloupe, ½ cup (215 mg).  · Cereal, 100% bran, ½ cup (200-400 mg).  · Cheeseburger, single, fast food, 1 each (225-400 mg).  · Chicken, 3 oz (220 mg).  · Clams, canned, 3 oz (535 mg).  · Crab, 3 oz (225 mg).  · Dates, 5 each (270 mg).  · Dried beans and peas, ½ cup (300-475 mg).  · Figs, dried, 2 each (260 mg).  · Fish: halibut, tuna, cod, snapper, 3 oz (480 mg).  · Fish: salmon, haddock, swordfish, perch, 3 oz (300 mg).  · Fish, tuna, canned 3 oz (200 mg).  · French fries, fast food, 3 oz (470 mg).  · Granola with fruit and nuts, ½ cup (200 mg).  · Grapefruit juice, ½ cup (200 mg).  · Greens, beet, ½ cup (655 mg).  · Honeydew melon, ½ cup (200 mg).  · Kale, raw, 1 cup (300 mg).  · Kiwi, 1 medium (240  mg).  · Kohlrabi, rutabaga, parsnips, ½ cup (280 mg).  · Lentils, ½ cup (365 mg).  · Mango, 1 each (325 mg).  · Milk, chocolate, 1 cup (420 mg).  · Milk: nonfat, low-fat, whole, buttermilk, 1 cup (350-380 mg).  · Molasses, 1 Tbsp (295 mg).  · Mushrooms, ½ cup (280) mg.  · Nectarine, 1 each (275 mg).  · Nuts: almonds, peanuts, hazelnuts, [place], cashew, mixed, 1 oz (200 mg).  · Nuts, pistachios, 1 oz (295 mg).  · Orange, 1 each (240 mg).  · Orange juice, ½ cup (235 mg).  · Papaya, medium, ½ fruit (390 mg).  · Peanut butter, chunky, 2 Tbsp (240 mg).  · Peanut butter, smooth, 2 Tbsp (210 mg).  · Pear, 1 medium (200 mg).  · Pomegranate, 1 whole (400 mg).  · Pomegranate juice, ½ cup (215 mg).  · Pork, 3 oz (350 mg).  · Potato chips, salted, 1 oz (465 mg).  · Potato, baked with skin, 1 medium (925 mg).  · Potatoes, boiled, ½ cup (255 mg).  · Potatoes, mashed, ½ cup (330 mg).  · Prune juice, ½ cup (  370 mg).  · Prunes, 5 each (305 mg).  · Pudding, chocolate, ½ cup (230 mg).  · Pumpkin, canned, ½ cup (250 mg).  · Raisins, seedless, ¼ cup (270 mg).  · Seeds, sunflower or pumpkin, 1 oz (240 mg).  · Soy milk, 1 cup (300 mg).  · Spinach, ½ cup (420 mg).  · Spinach, canned, ½ cup (370 mg).  · Sweet potato, baked with skin, 1 medium (450 mg).  · Swiss chard, ½ cup (480 mg).  · Tomato or vegetable juice, ½ cup (275 mg).  · Tomato sauce or puree, ½ cup (400-550 mg).  · Tomato, raw, 1 medium (290 mg).  · Tomatoes, canned, ½ cup (200-300 mg).  · Turkey, 3 oz (250 mg).  · Wheat germ, 1 oz (250 mg).  · Winter squash, ½ cup (250 mg).  · Yogurt, plain or fruited, 6 oz (260-435 mg).  · Zucchini, ½ cup (220 mg).  MODERATE IN POTASSIUM  The following foods and beverages have 50-200 mg of potassium per serving:  · Apple, 1 each (150 mg).  · Apple juice, ½ cup (150 mg).  · Applesauce, ½ cup (90 mg).  · Apricot nectar, ½ cup (140 mg).  · Asparagus, small spears, ½ cup or 6 spears (155 mg).  · Bagel, cinnamon raisin, 1 each (130 mg).  · Bagel,  egg or plain, 4 in., 1 each (70 mg).  · Beans, green, ½ cup (90 mg).  · Beans, yellow, ½ cup (190 mg).  · Beer, regular, 12 oz (100 mg).  · Beets, canned, ½ cup (125 mg).  · Blackberries, ½ cup (115 mg).  · Blueberries, ½ cup (60 mg).  · Bread, whole wheat, 1 slice (70 mg).  · Broccoli, raw, ½ cup (145 mg).  · Cabbage, ½ cup (150 mg).  · Carrots, cooked or raw, ½ cup (180 mg).  · Cauliflower, raw, ½ cup (150 mg).  · Celery, raw, ½ cup (155 mg).  · Cereal, bran flakes, ½cup (120-150 mg).  · Cheese, cottage, ½ cup (110 mg).  · Cherries, 10 each (150 mg).  · Chocolate, 1½ oz bar (165 mg).  · Coffee, brewed 6 oz (90 mg).  · Corn, ½ cup or 1 ear (195 mg).  · Cucumbers, ½ cup (80 mg).  · Egg, large, 1 each (60 mg).  · Eggplant, ½ cup (60 mg).  · Endive, raw, ½cup (80 mg).  · English muffin, 1 each (65 mg).  · Fish, orange roughy, 3 oz (150 mg).  · Frankfurter, beef or pork, 1 each (75 mg).  · Fruit cocktail, ½ cup (115 mg).  · Grape juice, ½ cup (170 mg).  · Grapefruit, ½ fruit (175 mg).  · Grapes, ½ cup (155 mg).  · Greens: kale, turnip, collard, ½ cup (110-150 mg).  · Ice cream or frozen yogurt, chocolate, ½ cup (175 mg).  · Ice cream or frozen yogurt, vanilla, ½ cup (120-150 mg).  · Lemons, limes, 1 each (80 mg).  · Lettuce, all types, 1 cup (100 mg).  · Mixed vegetables, ½ cup (150 mg).  · Mushrooms, raw, ½ cup (110 mg).  · Nuts: walnuts, pecans, or macadamia, 1 oz (125 mg).  · Oatmeal, ½ cup (80 mg).  · Okra, ½ cup (110 mg).  · Onions, raw, ½ cup (120 mg).  · Peach, 1 each (185 mg).  · Peaches, canned, ½ cup (120 mg).  · Pears, canned, ½ cup (120 mg).  · Peas, green,   frozen, ½ cup (90 mg).  · Peppers, green, ½ cup (130 mg).  · Peppers, red, ½ cup (160 mg).  · Pineapple juice, ½ cup (165 mg).  · Pineapple, fresh or canned, ½ cup (100 mg).  · Plums, 1 each (105 mg).  · Pudding, vanilla, ½ cup (150 mg).  · Raspberries, ½ cup (90 mg).  · Rhubarb, ½ cup (115 mg).  · Rice, wild, ½ cup (80 mg).  · Shrimp, 3 oz (155  mg).  · Spinach, raw, 1 cup (170 mg).  · Strawberries, ½ cup (125 mg).  · Summer squash ½ cup (175-200 mg).  · Swiss chard, raw, 1 cup (135 mg).  · Tangerines, 1 each (140 mg).  · Tea, brewed, 6 oz (65 mg).  · Turnips, ½ cup (140 mg).  · Watermelon, ½ cup (85 mg).  · Wine, red, table, 5 oz (180 mg).  · Wine, white, table, 5 oz (100 mg).  LOW IN POTASSIUM  The following foods and beverages have less than 50 mg of potassium per serving.  · Bread, white, 1 slice (30 mg).  · Carbonated beverages, 12 oz (less than 5 mg).  · Cheese, 1 oz (20-30 mg).  · Cranberries, ½ cup (45 mg).  · Cranberry juice cocktail, ½ cup (20 mg).  · Fats and oils, 1 Tbsp (less than 5 mg).  · Hummus, 1 Tbsp (32 mg).  · Nectar: papaya, mango, or pear, ½ cup (35 mg).  · Rice, white or brown, ½ cup (50 mg).  · Spaghetti or macaroni, ½ cup cooked (30 mg).  · Tortilla, flour or corn, 1 each (50 mg).  · Waffle, 4 in., 1 each (50 mg).  · Water chestnuts, ½ cup (40 mg).  Document Released: 09/16/2004 Document Revised: 02/07/2013 Document Reviewed: 12/30/2012  [employer]® Patient Information ©2015 [employer]. This information is not intended to replace advice given to you by your health care provider. Make sure you discuss any questions you have with your health care provider.

## 2014-06-11 NOTE — Progress Notes (Signed)
  Subjective:     Erin Hill is a 59 y.o. female and is here for a comprehensive physical exam. The patient reports no problems.  Not really exercising regularly but says she is trying to start.  Her diuretic was changed by Cardiology.    History   Social History  . Marital Status: Married    Spouse Name: Theron Aristaeter  . Number of Children: N/A  . Years of Education: N/A   Occupational History  . AUTHORIZATIONS    Social History Main Topics  . Smoking status: Never Smoker   . Smokeless tobacco: Not on file  . Alcohol Use: No  . Drug Use: No  . Sexual Activity:    Partners: Male   Other Topics Concern  . Not on file   Social History Narrative   No regular exercise.     Health Maintenance  Topic Date Due  . PAP SMEAR  03/11/2014  . Hepatitis C Screening  02/16/2016 (Originally Sep 29, 1955)  . HIV Screening  02/16/2016 (Originally 08/16/1971)  . INFLUENZA VACCINE  09/17/2014  . TETANUS/TDAP  02/17/2015  . MAMMOGRAM  12/22/2015  . COLONOSCOPY  11/14/2016    The following portions of the patient's history were reviewed and updated as appropriate: allergies, current medications, past family history, past medical history, past social history, past surgical history and problem list.  Review of Systems A comprehensive review of systems was negative.   Objective:    BP 106/54 mmHg  Pulse 73  Ht 5\' 2"  (1.575 m)  Wt 155 lb (70.308 kg)  BMI 28.34 kg/m2 General appearance: alert, cooperative and appears stated age Head: Normocephalic, without obvious abnormality, atraumatic Eyes: conj clear, EOMi PEERLA Ears: normal TM's and external ear canals both ears Nose: Nares normal. Septum midline. Mucosa normal. No drainage or sinus tenderness. Throat: lips, mucosa, and tongue normal; teeth and gums normal Neck: no adenopathy, no carotid bruit, no JVD, supple, symmetrical, trachea midline and thyroid not enlarged, symmetric, no tenderness/mass/nodules Back: symmetric, no curvature. ROM  normal. No CVA tenderness. Lungs: clear to auscultation bilaterally Breasts: normal appearance, no masses or tenderness Heart: regular rate and rhythm, S1, S2 normal, no murmur, click, rub or gallop Abdomen: soft, non-tender; bowel sounds normal; no masses,  no organomegaly Pelvic: cervix normal in appearance, external genitalia normal, no adnexal masses or tenderness, no cervical motion tenderness, rectovaginal septum normal, uterus normal size, shape, and consistency and vagina normal without discharge Extremities: extremities normal, atraumatic, no cyanosis or edema Pulses: 2+ and symmetric Skin: Skin color, texture, turgor normal. No rashes or lesions Lymph nodes: Cervical, supraclavicular, and axillary nodes normal. Neurologic: Alert and oriented X 3, normal strength and tone. Normal symmetric reflexes. Normal coordination and gait    Assessment:    Healthy female exam.      Plan:     See After Visit Summary for Counseling Recommendations  Keep up a regular exercise program and make sure you are eating a healthy diet Try to eat 4 servings of dairy a day, or if you are lactose intolerant take a calcium with vitamin D daily.  Your vaccines are up to date.  Will call with Pap smear results once available. Next  Due for 6 month repeat diagnostic mammogram with ultrasound to follow-up on lesion on the left breast.  HTN- well controlled. F/U in 6 months. Due for CMP, lipids.

## 2014-06-11 NOTE — Telephone Encounter (Signed)
Orders for mammogram faxed.Loralee PacasBarkley, Placida Cambre [place]

## 2014-06-12 LAB — CYTOLOGY - PAP

## 2014-06-14 NOTE — Progress Notes (Signed)
Quick Note:  Call patient: Your Pap smear is normal. Repeat in 5 years. ______ 

## 2014-06-18 LAB — LIPID PANEL
Cholesterol: 241 mg/dL — ABNORMAL HIGH (ref 0–200)
HDL: 39 mg/dL — ABNORMAL LOW (ref >=46)
LDL CALC: 170 mg/dL — AB (ref 0–99)
TRIGLYCERIDES: 162 mg/dL — AB (ref <150)
Total CHOL/HDL Ratio: 6.2 Ratio
VLDL: 32 mg/dL (ref 0–40)

## 2014-06-18 LAB — BASIC METABOLIC PANEL
BUN: 18 mg/dL (ref 6–23)
CO2: 30 meq/L (ref 19–32)
Calcium: 9.3 mg/dL (ref 8.4–10.5)
Chloride: 101 mEq/L (ref 96–112)
Creat: 0.7 mg/dL (ref 0.50–1.10)
Glucose, Bld: 132 mg/dL — ABNORMAL HIGH (ref 70–99)
POTASSIUM: 4.3 meq/L (ref 3.5–5.3)
SODIUM: 140 meq/L (ref 135–145)

## 2014-06-19 ENCOUNTER — Other Ambulatory Visit: Payer: Self-pay | Admitting: *Deleted

## 2014-06-19 MED ORDER — ROSUVASTATIN CALCIUM 10 MG PO TABS: ORAL_TABLET | ORAL | Status: DC

## 2014-06-21 ENCOUNTER — Other Ambulatory Visit: Payer: Self-pay | Admitting: *Deleted

## 2014-06-21 MED ORDER — ROSUVASTATIN CALCIUM 10 MG PO TABS: ORAL_TABLET | ORAL | Status: DC

## 2014-07-09 LAB — HM MAMMOGRAPHY

## 2014-08-01 ENCOUNTER — Encounter: Payer: Self-pay | Admitting: Family Medicine

## 2014-09-11 ENCOUNTER — Telehealth: Payer: Self-pay | Admitting: *Deleted

## 2014-09-11 NOTE — Telephone Encounter (Signed)
Pt stated that she is having muscle fatigue and pain with the cholesterol medication that she is currently taking the generic of crestor. She stated that this happened to her when she was taking the lipitor. She has not done anything different and wanted to know what she should do. Before she was receiving the brand name Crestor and this time she has been getting the generic.Laureen Ochs, Viann Shove

## 2014-09-11 NOTE — Telephone Encounter (Signed)
We can send brand.  Not sure if wants to mail order. May require a PA but should be fine.

## 2014-09-11 NOTE — Telephone Encounter (Signed)
Called and lvm informing pt of recommendations. Told to call back w/pharm.Erin Hill

## 2014-09-12 NOTE — Telephone Encounter (Signed)
lvm asking that she rtn call if I am not available she can leave this information on my vm and I will forward it to Dr. Linford Arnold.Erin Hill [place]

## 2014-09-13 MED ORDER — CRESTOR 10 MG PO TABS: ORAL_TABLET | ORAL | Status: DC

## 2014-10-26 ENCOUNTER — Telehealth: Payer: Self-pay | Admitting: *Deleted

## 2014-10-26 NOTE — Telephone Encounter (Signed)
Pt called and stated that she is going to get a tattoo and would like for Dr. Linford Arnold to write her an Rx for an Abx for this like she did before. She stated that she will p/u on Monday. Will fwd to pcp for advice.[name] [place]

## 2014-10-29 MED ORDER — AMOXICILLIN 500 MG PO CAPS: 2000.0000 mg | ORAL_CAPSULE | Freq: Once | ORAL | Status: DC

## 2014-10-29 NOTE — Telephone Encounter (Signed)
lvm informing pt that rx is up front.Erin Hill'

## 2014-10-29 NOTE — Telephone Encounter (Signed)
Ready to pick up Rx

## 2014-12-14 ENCOUNTER — Encounter: Payer: Self-pay | Admitting: Physician Assistant

## 2014-12-14 ENCOUNTER — Other Ambulatory Visit: Payer: Self-pay | Admitting: Physician Assistant

## 2014-12-14 ENCOUNTER — Ambulatory Visit (INDEPENDENT_AMBULATORY_CARE_PROVIDER_SITE_OTHER): Payer: BLUE CROSS/BLUE SHIELD | Admitting: Physician Assistant

## 2014-12-14 VITALS — BP 131/57 | HR 87 | Temp 98.8°F | Ht 62.0 in | Wt 146.0 lb

## 2014-12-14 DIAGNOSIS — J111 Influenza due to unidentified influenza virus with other respiratory manifestations: Secondary | ICD-10-CM

## 2014-12-14 DIAGNOSIS — R69 Illness, unspecified: Principal | ICD-10-CM

## 2014-12-14 LAB — CBC WITH DIFFERENTIAL/PLATELET
BASOS ABS: 0 10*3/uL (ref 0.0–0.1)
BASOS PCT: 0 % (ref 0–1)
EOS ABS: 0.1 10*3/uL (ref 0.0–0.7)
EOS PCT: 1 % (ref 0–5)
HCT: 39 % (ref 36.0–46.0)
Hemoglobin: 13.6 g/dL (ref 12.0–15.0)
LYMPHS ABS: 1.7 10*3/uL (ref 0.7–4.0)
Lymphocytes Relative: 14 % (ref 12–46)
MCH: 30.1 pg (ref 26.0–34.0)
MCHC: 34.9 g/dL (ref 30.0–36.0)
MCV: 86.3 fL (ref 78.0–100.0)
MPV: 11.3 fL (ref 8.6–12.4)
Monocytes Absolute: 0.8 10*3/uL (ref 0.1–1.0)
Monocytes Relative: 7 % (ref 3–12)
Neutro Abs: 9.3 10*3/uL — ABNORMAL HIGH (ref 1.7–7.7)
Neutrophils Relative %: 78 % — ABNORMAL HIGH (ref 43–77)
PLATELETS: 341 10*3/uL (ref 150–400)
RBC: 4.52 MIL/uL (ref 3.87–5.11)
RDW: 13.5 % (ref 11.5–15.5)
WBC: 11.9 10*3/uL — ABNORMAL HIGH (ref 4.0–10.5)

## 2014-12-14 LAB — POCT INFLUENZA A/B
INFLUENZA A, POC: NEGATIVE
INFLUENZA B, POC: NEGATIVE

## 2014-12-14 NOTE — Progress Notes (Signed)
Erin Hill is a 59 y.o. female who presents to [facility] today for body aches, chills, headache and nausea. Ms. Admire states that her symptoms began yesterday afternoon and seemed to happen all of a sudden.She states that she thinks she had a fever because she felt hot, but she does not have a thermometer. She has not received an influenza vaccine. She states that her husband is also currently ill with the same symptoms as her. She denies runny nose, congestion, vomiting, abdominal pain, diarrhea or constipation. She states that she has took tylenol this morning which seemed to help her body aches some. She is extremely weak and fatigued.    Past Medical History  Diagnosis Date  . Hearing loss     chronic  . Proteinuria     chronic (not related to DM)  . Rheumatic heart disease     s/p aortic/ mitral valve replace  . Miscarriage     X 2  . Atrial flutter (HCC)     2015   Past Surgical History  Procedure Laterality Date  . Double heart valve replacement  1999  . Echo ef 75-80%    . Stapes surgery, left  2003    then removed 2 years later  . Tab       X 1  . Sab      X 2  . Tonsillectomy  as a child   Social History  Substance Use Topics  . Smoking status: Never Smoker   . Smokeless tobacco: Not on file  . Alcohol Use: No   family history includes Diabetes in her father and father; Hyperlipidemia in her father; Hypertension in her father and mother; Lung cancer in her mother.  ROS as above Medications: Current Outpatient Prescriptions  Medication Sig Dispense Refill  . b complex vitamins tablet Take 1 tablet by mouth daily.    . CRESTOR 10 MG tablet TAKE 1 AND 1/2 TABLET DAILY 135 tablet 1  . glucose blood test strip Use as instructed 100 each 6  . hydrochlorothiazide (HYDRODIURIL) 25 MG tablet     . metoprolol succinate (TOPROL-XL) 25 MG 24 hr tablet Take 25 mg by mouth daily.    . Omega-3 Fatty Acids (FISH OIL) 1200 MG CAPS Take 1  capsule by mouth daily.    . quinapril (ACCUPRIL) 20 MG tablet     . warfarin (COUMADIN) 2 MG tablet Take 2 mg by mouth daily.    Marland Kitchen warfarin (COUMADIN) 4 MG tablet Take 4 mg by mouth daily. Per cardiologist     No current facility-administered medications for this visit.   Allergies  Allergen Reactions  . Shellfish Allergy     Other reaction(s): Other Whelps all over her body  . Erythromycin     REACTION: renal toxicity  . Versed [Midazolam]     REACTION: rash, fever  . Contrast Media [Iodinated Diagnostic Agents] Rash    Fever  . Iodine Rash    Contrast media     Exam:  BP 131/57 mmHg  Pulse 87  Temp(Src) 98.8 F (37.1 C) (Oral)  Ht  (1.575 m)  Wt 146 lb (66.225 kg)  BMI 26.70 kg/m2 Gen: WDWN female in no acute distress. She is non-toxic appearing HEENT: EOMI,  MMM Lungs: Normal work of breathing. CTABL Heart: RRR no MRG. Mechanical heart valve.  Exts: Brisk capillary refill, warm and well perfused.   Rapid Influenza Diagnostic Test performed in office today with negative  results for both Influenza A and B.  Assessment: Viral syndrome vs mononucleosis based on patient description of symptoms, physical exam within normal limits and negative RIDT.   Plan: Patient instructed to have lab work (CBC, mono) done today to investigate possible mononucleosis cause. Patient education provided on the benefits of rest, ibuprofen/tylenol for body aches, mucinex/flonase for congestion and maintaining appropriate fluid intake.Patient instructed to return to office or ED if symptoms worsen, patient experiences chest pain, syncope or shortness of breath. Side effects of medication explained and all questions answered. Patient conveyed understanding and agreed to current treatment plan.

## 2014-12-14 NOTE — Patient Instructions (Addendum)
Mucinex and Flonase.   Influenza, Adult Influenza ("the flu") is a viral infection of the respiratory tract. It occurs more often in winter months because people spend more time in close contact with one another. Influenza can make you feel very sick. Influenza easily spreads from person to person (contagious). CAUSES  Influenza is caused by a virus that infects the respiratory tract. You can catch the virus by breathing in droplets from an infected person's cough or sneeze. You can also catch the virus by touching something that was recently contaminated with the virus and then touching your mouth, nose, or eyes. RISKS AND COMPLICATIONS You may be at risk for a more severe case of influenza if you smoke cigarettes, have diabetes, have chronic heart disease (such as heart failure) or lung disease (such as asthma), or if you have a weakened immune system. Elderly people and pregnant women are also at risk for more serious infections. The most common problem of influenza is a lung infection (pneumonia). Sometimes, this problem can require emergency medical care and may be life threatening. SIGNS AND SYMPTOMS  Symptoms typically last 4 to 10 days and may include:  Fever.  Chills.  Headache, body aches, and muscle aches.  Sore throat.  Chest discomfort and cough.  Poor appetite.  Weakness or feeling tired.  Dizziness.  Nausea or vomiting. DIAGNOSIS  Diagnosis of influenza is often made based on your history and a physical exam. A nose or throat swab test can be done to confirm the diagnosis. TREATMENT  In mild cases, influenza goes away on its own. Treatment is directed at relieving symptoms. For more severe cases, your health care provider may prescribe antiviral medicines to shorten the sickness. Antibiotic medicines are not effective because the infection is caused by a virus, not by bacteria. HOME CARE INSTRUCTIONS  Take medicines only as directed by your health care  provider.  Use a cool mist humidifier to make breathing easier.  Get plenty of rest until your temperature returns to normal. This usually takes 3 to 4 days.  Drink enough fluid to keep your urine clear or pale yellow.  Cover yourmouth and nosewhen coughing or sneezing,and wash your handswellto prevent thevirusfrom spreading.  Stay homefromwork orschool untilthe fever is gonefor at least 71full day. PREVENTION  An annual influenza vaccination (flu shot) is the best way to avoid getting influenza. An annual flu shot is now routinely recommended for all adults in the [place] SEEK MEDICAL CARE IF:  You experiencechest pain, yourcough worsens,or you producemore mucus.  Youhave nausea,vomiting, ordiarrhea.  Your fever returns or gets worse. SEEK IMMEDIATE MEDICAL CARE IF:  You havetrouble breathing, you become short of breath,or your skin ornails becomebluish.  You have severe painor stiffnessin the neck.  You develop a sudden headache, or pain in the face or ear.  You have nausea or vomiting that you cannot control. MAKE SURE YOU:   Understand these instructions.  Will watch your condition.  Will get help right away if you are not doing well or get worse.   This information is not intended to replace advice given to you by your health care provider. Make sure you discuss any questions you have with your health care provider.   Document Released: 01/31/2000 Document Revised: 02/23/2014 Document Reviewed: 05/04/2011 Elsevier Interactive Patient Education [employer]2016 Elsevier Inc.

## 2014-12-15 LAB — MONONUCLEOSIS SCREEN: HETEROPHILE, MONO SCREEN: NEGATIVE

## 2014-12-19 ENCOUNTER — Ambulatory Visit (INDEPENDENT_AMBULATORY_CARE_PROVIDER_SITE_OTHER): Payer: BLUE CROSS/BLUE SHIELD | Admitting: Osteopathic Medicine

## 2014-12-19 ENCOUNTER — Encounter: Payer: Self-pay | Admitting: Osteopathic Medicine

## 2014-12-19 VITALS — BP 121/62 | HR 99 | Temp 97.8°F | Wt 146.8 lb

## 2014-12-19 DIAGNOSIS — R05 Cough: Secondary | ICD-10-CM

## 2014-12-19 DIAGNOSIS — J019 Acute sinusitis, unspecified: Secondary | ICD-10-CM

## 2014-12-19 DIAGNOSIS — R059 Cough, unspecified: Secondary | ICD-10-CM

## 2014-12-19 DIAGNOSIS — B9689 Other specified bacterial agents as the cause of diseases classified elsewhere: Secondary | ICD-10-CM

## 2014-12-19 MED ORDER — GUAIFENESIN-CODEINE 100-10 MG/5ML PO SYRP: 5.0000 mL | ORAL_SOLUTION | Freq: Three times a day (TID) | ORAL | Status: DC | PRN

## 2014-12-19 MED ORDER — BENZONATATE 200 MG PO CAPS
200.0000 mg | ORAL_CAPSULE | Freq: Three times a day (TID) | ORAL | Status: DC | PRN
Start: 2014-12-19 — End: 2015-07-12

## 2014-12-19 MED ORDER — AMOXICILLIN-POT CLAVULANATE 875-125 MG PO TABS: 1.0000 | ORAL_TABLET | Freq: Two times a day (BID) | ORAL | Status: DC

## 2014-12-19 NOTE — Progress Notes (Signed)
HPI: Erin Hill is a 59 y.o. female who [facility]  today for chief complaint of:  Chief Complaint  Patient presents with  . Nose bleeding    x 2-3 days   . Sinus pressure    . Location: SINUSES, sore throat, cough  . Quality: congestion . Severity: moderate to severe . Duration: 2 - 3 days . Timing: constant . Assoc signs/symptoms: bloody nose x 2, patient on coumadin   Past medical, social and family history reviewed: Past Medical History  Diagnosis Date  . Hearing loss     chronic  . Proteinuria     chronic (not related to DM)  . Rheumatic heart disease     s/p aortic/ mitral valve replace  . Miscarriage     X 2  . Atrial flutter (HCC)     2015   Past Surgical History  Procedure Laterality Date  . Double heart valve replacement  1999  . Echo ef 75-80%    . Stapes surgery, left  2003    then removed 2 years later  . Tab       X 1  . Sab      X 2  . Tonsillectomy  as a child   Social History  Substance Use Topics  . Smoking status: Never Smoker   . Smokeless tobacco: Not on file  . Alcohol Use: No   Family History  Problem Relation Age of Onset  . Diabetes Father   . Lung cancer Mother     smoked until her 6240's  . Hypertension Mother   . Diabetes Father   . Hyperlipidemia Father   . Hypertension Father     Current Outpatient Prescriptions  Medication Sig Dispense Refill  . b complex vitamins tablet Take 1 tablet by mouth daily.    . CRESTOR 10 MG tablet TAKE 1 AND 1/2 TABLET DAILY 135 tablet 1  . glucose blood test strip Use as instructed 100 each 6  . hydrochlorothiazide (HYDRODIURIL) 25 MG tablet     . metoprolol succinate (TOPROL-XL) 25 MG 24 hr tablet Take 25 mg by mouth daily.    . Omega-3 Fatty Acids (FISH OIL) 1200 MG CAPS Take 1 capsule by mouth daily.    . quinapril (ACCUPRIL) 20 MG tablet     . warfarin (COUMADIN) 2 MG tablet Take 2 mg by mouth daily.    Marland Kitchen. warfarin (COUMADIN) 4 MG tablet  Take 4 mg by mouth daily. Per cardiologist     No current facility-administered medications for this visit.   Allergies  Allergen Reactions  . Shellfish Allergy     Other reaction(s): Other Whelps all over her body  . Erythromycin     REACTION: renal toxicity  . Versed [Midazolam]     REACTION: rash, fever  . Contrast Media [Iodinated Diagnostic Agents] Rash    Fever  . Iodine Rash    Contrast media      Review of Systems: CONSTITUTIONAL:  No  fever, no chills, No  unintentional weight changes HEAD/EYES/EARS/NOSE/THROAT: No headache, no vision change, no hearing change, Yes  sore throat CARDIAC: No chest pain, no pressure/palpitations, no orthopnea RESPIRATORY: Yes  cough, No  shortness of breath/wheeze GASTROINTESTINAL: No nausea, no vomiting, no abdominal pain, no blood in stool, no diarrhea, no constipation MUSCULOSKELETAL: Yes  myalgia/arthralgia GENITOURINARY: No incontinence, No abnormal genital bleeding/discharge SKIN: No rash/wounds/concerning lesions HEM/ONC: No easy bruising/bleeding, no abnormal lymph node ENDOCRINE: No polyuria/polydipsia/polyphagia,  no heat/cold intolerance  NEUROLOGIC: No weakness, no dizziness, no slurred speech PSYCHIATRIC: No concerns with depression, no concerns with anxiety, no sleep problems    Exam:  BP 121/62 mmHg  Pulse 99  Temp(Src) 97.8 F (36.6 C)  Wt 146 lb 12.8 oz (66.588 kg) Constitutional: VSS, see above. General Appearance: alert, well-developed, well-nourished, NAD Eyes: Normal lids and conjunctive, non-icteric sclera, PERRLA Ears, Nose, Mouth, Throat: Normal external inspection ears/nares/mouth/lips/gums, TM normal bilaterally, MMM, posterior pharynx Yes  erythema No  exudate Neck: No masses, trachea midline. No thyroid enlargement/tenderness/mass appreciated. No lymphadenopathy Respiratory: Normal respiratory effort. no wheeze, no rhonchi, no rales Cardiovascular: S1/S2 normal, mechanical valves audible, no rub/gallop  auscultated. RRR.    No results found for this or any previous visit (from the past 72 hour(s)).    ASSESSMENT/PLAN:  Acute bacterial rhinosinusitis - Plan: amoxicillin-clavulanate (AUGMENTIN) 875-125 MG tablet  Cough - Plan: benzonatate (TESSALON) 200 MG capsule, guaiFENesin-codeine (ROBITUSSIN AC) 100-10 MG/5ML syrup   Advised avoid nasal spray except saline to keep nasal passages moist. Can use Afrin only if bleeding unable to be controlled with pressure. Following with cardio for [name] check, no other bleeding from gums no dark tarry stool or blood in stool.   Return for AS SCHEDULED WITH DR METHENEY FOR ROUTINE CARE.

## 2014-12-28 ENCOUNTER — Ambulatory Visit: Payer: BLUE CROSS/BLUE SHIELD | Admitting: Family Medicine

## 2015-06-07 DIAGNOSIS — R072 Precordial pain: Secondary | ICD-10-CM | POA: Diagnosis not present

## 2015-06-12 ENCOUNTER — Encounter: Payer: Self-pay | Admitting: Family Medicine

## 2015-06-12 DIAGNOSIS — I5189 Other ill-defined heart diseases: Secondary | ICD-10-CM | POA: Insufficient documentation

## 2015-06-14 ENCOUNTER — Other Ambulatory Visit: Payer: Self-pay | Admitting: *Deleted

## 2015-06-14 MED ORDER — AMOXICILLIN 500 MG PO CAPS: 2000.0000 mg | ORAL_CAPSULE | Freq: Once | ORAL | Status: DC

## 2015-06-14 NOTE — Progress Notes (Signed)
Pt called requesting abx for tattoo. abx sent.Loralee PacasBarkley, [name]

## 2015-06-18 DIAGNOSIS — Z952 Presence of prosthetic heart valve: Secondary | ICD-10-CM | POA: Diagnosis not present

## 2015-07-12 ENCOUNTER — Ambulatory Visit (INDEPENDENT_AMBULATORY_CARE_PROVIDER_SITE_OTHER): Payer: BLUE CROSS/BLUE SHIELD

## 2015-07-12 ENCOUNTER — Ambulatory Visit (INDEPENDENT_AMBULATORY_CARE_PROVIDER_SITE_OTHER): Payer: BLUE CROSS/BLUE SHIELD | Admitting: Family Medicine

## 2015-07-12 ENCOUNTER — Encounter: Payer: Self-pay | Admitting: Family Medicine

## 2015-07-12 DIAGNOSIS — M25511 Pain in right shoulder: Secondary | ICD-10-CM

## 2015-07-12 DIAGNOSIS — Z23 Encounter for immunization: Secondary | ICD-10-CM

## 2015-07-12 DIAGNOSIS — Z0000 Encounter for general adult medical examination without abnormal findings: Secondary | ICD-10-CM | POA: Diagnosis not present

## 2015-07-12 DIAGNOSIS — M542 Cervicalgia: Secondary | ICD-10-CM

## 2015-07-12 DIAGNOSIS — R7301 Impaired fasting glucose: Secondary | ICD-10-CM

## 2015-07-12 LAB — POCT GLYCOSYLATED HEMOGLOBIN (HGB A1C): HEMOGLOBIN A1C: 5.8

## 2015-07-12 NOTE — Progress Notes (Signed)
Subjective:     Erin Hill is a 60 y.o. female and is here for a comprehensive physical exam. The patient reports problems - right shoulder pain.  Patient complains of right shoulder pain 1 year. She said she thinks she may have injured it initially. She says the pain is mostly on the outside of the shoulder. She it is better than it was several months ago. She's been trying to self massage it. That does provide some relief but sometimes she feels like it almost makes it worse. She's not currently taking any pain medications for it.  She also complains of some intermittent right-sided neck pain that shoots up towards her ear. She said it started a few weeks ago. It will occur just intermittently and last for about 5 minutes. Afterwards she'll start to notice that her nose will run. She thinks it may be allergy related. No coughing sneezing or fever or chest congestion.  IFG - no in thrist or urination.   Social History   Social History  . Marital Status: Married    Spouse Name: Theron Aristaeter  . Number of Children: N/A  . Years of Education: N/A   Occupational History  . AUTHORIZATIONS    Social History Main Topics  . Smoking status: Never Smoker   . Smokeless tobacco: Not on file  . Alcohol Use: No  . Drug Use: No  . Sexual Activity:    Partners: Male   Other Topics Concern  . Not on file   Social History Narrative   No regular exercise.     Health Maintenance  Topic Date Due  . TETANUS/TDAP  02/17/2015  . INFLUENZA VACCINE  01/17/2016 (Originally 09/17/2015)  . Hepatitis C Screening  02/16/2016 (Originally 1955/04/09)  . HIV Screening  02/16/2016 (Originally 08/16/1971)  . MAMMOGRAM  07/08/2016  . COLONOSCOPY  11/14/2016  . PAP SMEAR  06/10/2017    The following portions of the patient's history were reviewed and updated as appropriate: allergies, current medications, past family history, past medical history, past social history, past surgical history and problem  list.  Review of Systems A comprehensive review of systems was negative.   Objective:    There were no vitals taken for this visit. General appearance: alert, cooperative and appears stated age Head: Normocephalic, without obvious abnormality, atraumatic Eyes: conj clear, EOMI, PEERLA Ears: normal TM's and external ear canals both ears Nose: Nares normal. Septum midline. Mucosa normal. No drainage or sinus tenderness. Throat: lips, mucosa, and tongue normal; teeth and gums normal Neck: no adenopathy, no carotid bruit, no JVD, supple, symmetrical, trachea midline and thyroid not enlarged, symmetric, no tenderness/mass/nodules Back: symmetric, no curvature. ROM normal. No CVA tenderness. Lungs: clear to auscultation bilaterally Breasts: normal appearance, no masses or tenderness Heart: regular rate and rhythm, S1, S2 normal, no murmur, click, rub or gallop Abdomen: soft, non-tender; bowel sounds normal; no masses,  no organomegaly Extremities: extremities normal, atraumatic, no cyanosis or edema Pulses: 2+ and symmetric Skin: Skin color, texture, turgor normal. No rashes or lesions Lymph nodes: Cervical, supraclavicular, and axillary nodes normal. Neurologic: Alert and oriented X 3, normal strength and tone. Normal symmetric reflexes. Normal coordination and gait    Right shoulder pain with slightly dec extension of the right shoulder.  She also has significant pain with external rotation and decreased internal rotation reaching behind her back. Strength is 5 out of 5 at the shoulder elbow and wrist. Slightly weak with empty can test on the right.  This mildly  tender over the lateral shoulder joint.   Assessment:    Healthy female exam.      Plan:     See After Visit Summary for Counseling Recommendations  Keep up a regular exercise program and make sure you are eating a healthy diet Try to eat 4 servings of dairy a day, or if you are lactose intolerant take a calcium with vitamin  D daily.  Your vaccines are up to date.   Tdap given today  Due for mammogram. Her mind her to schedule.  IFG -11 A1c is 5.8 which is stable.  Right shoulder pain-most consistent with bursitis. Given handout on stretches to do on her own and will get x-ray today. Discussed possible injection since thsi has been going on for year so far.    Right-sided neck pain to ear-unclear etiology.. Could be related to allergies so she wants to restart her Flonase which she has used in the fall she certainly can and see if this makes a difference. I did not any abnormalities such as cervical lymphadenopathy on exam today.

## 2015-07-13 LAB — COMPLETE METABOLIC PANEL WITH GFR
ALBUMIN: 4.3 g/dL (ref 3.6–5.1)
ALK PHOS: 59 U/L (ref 33–130)
ALT: 17 U/L (ref 6–29)
AST: 25 U/L (ref 10–35)
BUN: 23 mg/dL (ref 7–25)
CO2: 19 mmol/L — ABNORMAL LOW (ref 20–31)
Calcium: 9.1 mg/dL (ref 8.6–10.4)
Chloride: 105 mmol/L (ref 98–110)
Creat: 0.81 mg/dL (ref 0.50–1.05)
GFR, EST NON AFRICAN AMERICAN: 80 mL/min (ref >=60)
GLUCOSE: 91 mg/dL (ref 65–99)
POTASSIUM: 3.7 mmol/L (ref 3.5–5.3)
SODIUM: 142 mmol/L (ref 135–146)
Total Bilirubin: 0.6 mg/dL (ref 0.2–1.2)
Total Protein: 7.2 g/dL (ref 6.1–8.1)

## 2015-07-13 LAB — LIPID PANEL
CHOL/HDL RATIO: 3.6 ratio (ref <=5.0)
CHOLESTEROL: 229 mg/dL — AB (ref 125–200)
HDL: 63 mg/dL (ref >=46)
LDL Cholesterol: 152 mg/dL — ABNORMAL HIGH (ref <130)
TRIGLYCERIDES: 72 mg/dL (ref <150)
VLDL: 14 mg/dL (ref <30)

## 2015-07-17 DIAGNOSIS — Z952 Presence of prosthetic heart valve: Secondary | ICD-10-CM | POA: Diagnosis not present

## 2015-07-30 DIAGNOSIS — Z952 Presence of prosthetic heart valve: Secondary | ICD-10-CM | POA: Diagnosis not present

## 2015-08-15 DIAGNOSIS — Z952 Presence of prosthetic heart valve: Secondary | ICD-10-CM | POA: Diagnosis not present

## 2015-11-06 DIAGNOSIS — Z952 Presence of prosthetic heart valve: Secondary | ICD-10-CM | POA: Diagnosis not present

## 2016-02-20 ENCOUNTER — Encounter: Payer: Self-pay | Admitting: Family Medicine

## 2016-03-09 ENCOUNTER — Telehealth: Payer: Self-pay | Admitting: Family Medicine

## 2016-03-09 NOTE — Telephone Encounter (Signed)
Left VM for Pt to return clinic call regarding flu shot.

## 2016-03-09 NOTE — Telephone Encounter (Signed)
Please call pt and see if has had flu shot this year and document.

## 2016-04-06 ENCOUNTER — Other Ambulatory Visit: Payer: Self-pay | Admitting: *Deleted

## 2016-04-06 MED ORDER — AMOXICILLIN 500 MG PO CAPS: 2000.0000 mg | ORAL_CAPSULE | Freq: Once | ORAL | 0 refills | Status: DC

## 2016-07-14 ENCOUNTER — Encounter: Payer: BLUE CROSS/BLUE SHIELD | Admitting: Family Medicine

## 2016-11-10 DIAGNOSIS — R0789 Other chest pain: Secondary | ICD-10-CM | POA: Diagnosis not present

## 2016-11-10 DIAGNOSIS — R0609 Other forms of dyspnea: Secondary | ICD-10-CM | POA: Diagnosis not present

## 2016-11-10 DIAGNOSIS — I099 Rheumatic heart disease, unspecified: Secondary | ICD-10-CM | POA: Diagnosis not present

## 2016-11-10 DIAGNOSIS — I10 Essential (primary) hypertension: Secondary | ICD-10-CM | POA: Diagnosis not present

## 2016-11-11 DIAGNOSIS — Z952 Presence of prosthetic heart valve: Secondary | ICD-10-CM | POA: Diagnosis not present

## 2016-11-26 DIAGNOSIS — R0789 Other chest pain: Secondary | ICD-10-CM | POA: Diagnosis not present

## 2016-11-26 DIAGNOSIS — R0609 Other forms of dyspnea: Secondary | ICD-10-CM | POA: Diagnosis not present

## 2016-12-22 DIAGNOSIS — Z7901 Long term (current) use of anticoagulants: Secondary | ICD-10-CM | POA: Diagnosis not present

## 2016-12-22 DIAGNOSIS — Z952 Presence of prosthetic heart valve: Secondary | ICD-10-CM | POA: Diagnosis not present

## 2016-12-22 DIAGNOSIS — I4892 Unspecified atrial flutter: Secondary | ICD-10-CM | POA: Diagnosis not present

## 2016-12-25 DIAGNOSIS — R9439 Abnormal result of other cardiovascular function study: Secondary | ICD-10-CM | POA: Diagnosis not present

## 2016-12-25 DIAGNOSIS — I259 Chronic ischemic heart disease, unspecified: Secondary | ICD-10-CM | POA: Diagnosis not present

## 2017-01-01 ENCOUNTER — Ambulatory Visit (INDEPENDENT_AMBULATORY_CARE_PROVIDER_SITE_OTHER): Payer: BLUE CROSS/BLUE SHIELD | Admitting: Family Medicine

## 2017-01-01 ENCOUNTER — Encounter: Payer: Self-pay | Admitting: Family Medicine

## 2017-01-01 VITALS — BP 127/51 | HR 79 | Temp 98.5°F | Ht 62.0 in | Wt 149.0 lb

## 2017-01-01 DIAGNOSIS — J018 Other acute sinusitis: Secondary | ICD-10-CM

## 2017-01-01 MED ORDER — CRESTOR 10 MG PO TABS: ORAL_TABLET | ORAL | 3 refills | Status: DC

## 2017-01-01 MED ORDER — AMOXICILLIN-POT CLAVULANATE 875-125 MG PO TABS: 1.0000 | ORAL_TABLET | Freq: Two times a day (BID) | ORAL | 0 refills | Status: DC

## 2017-01-01 NOTE — Progress Notes (Signed)
   Subjective:    Patient ID: Erin Hill, female    DOB: 04/27/1955, 61 y.o.   MRN: [id number]  HPI 2-3 weeks of nasal congestion. Getting worse this week.  She feels like she has a lot of postnasal drip.  If she feels like it is actually getting worse and now is causing a cough.  She is been trying to take over-the-counter cough syrup which helps a little bit.  And some cough drops which do not help at all.  She has had some right-sided ear pain as well as some chills.  A couple days ago she had a low-grade fever of 99.5.  No nausea vomiting or diarrhea.  She says she did have a day or 2 of right sided maxillary facial pain but that actually seems a little better.  Shortness of breath.   Review of Systems     Objective:   Physical Exam  Constitutional: She is oriented to person, place, and time. She appears well-developed and well-nourished.  HENT:  Head: Normocephalic and atraumatic.  Right Ear: External ear normal.  Left Ear: External ear normal.  Nose: Nose normal.  Mouth/Throat: Oropharynx is clear and moist.  TMs and canals are clear.   Eyes: Conjunctivae and EOM are normal. Pupils are equal, round, and reactive to light.  Neck: Neck supple. No thyromegaly present.  Cardiovascular: Normal rate, regular rhythm and normal heart sounds.  Pulmonary/Chest: Effort normal and breath sounds normal. She has no wheezes.  Lymphadenopathy:    She has no cervical adenopathy.  Neurological: She is alert and oriented to person, place, and time.  Skin: Skin is warm and dry.  Psychiatric: She has a normal mood and affect.          Assessment & Plan:  Acute sinusitis-we will treat with Augmentin.  Call if not significantly better in 1 week.  Continue with nasal saline rinse.  She does have a cardiac procedure scheduled for the November 28 and wants to make sure that she is healthy before then.

## 2017-01-01 NOTE — Patient Instructions (Addendum)

## 2017-01-04 ENCOUNTER — Telehealth: Payer: Self-pay | Admitting: *Deleted

## 2017-01-04 NOTE — Telephone Encounter (Signed)
Pre Authorization sent to cover my meds. [id number]

## 2017-01-12 DIAGNOSIS — Z952 Presence of prosthetic heart valve: Secondary | ICD-10-CM | POA: Diagnosis not present

## 2017-01-13 DIAGNOSIS — I10 Essential (primary) hypertension: Secondary | ICD-10-CM | POA: Diagnosis not present

## 2017-01-13 DIAGNOSIS — I099 Rheumatic heart disease, unspecified: Secondary | ICD-10-CM | POA: Diagnosis not present

## 2017-01-13 DIAGNOSIS — R0789 Other chest pain: Secondary | ICD-10-CM | POA: Diagnosis not present

## 2017-01-13 DIAGNOSIS — R0609 Other forms of dyspnea: Secondary | ICD-10-CM | POA: Diagnosis not present

## 2017-01-13 DIAGNOSIS — R9439 Abnormal result of other cardiovascular function study: Secondary | ICD-10-CM | POA: Diagnosis not present

## 2017-01-13 DIAGNOSIS — Z09 Encounter for follow-up examination after completed treatment for conditions other than malignant neoplasm: Secondary | ICD-10-CM | POA: Diagnosis not present

## 2017-01-13 DIAGNOSIS — Z952 Presence of prosthetic heart valve: Secondary | ICD-10-CM | POA: Diagnosis not present

## 2017-02-11 DIAGNOSIS — Z952 Presence of prosthetic heart valve: Secondary | ICD-10-CM | POA: Diagnosis not present

## 2017-02-19 ENCOUNTER — Ambulatory Visit (INDEPENDENT_AMBULATORY_CARE_PROVIDER_SITE_OTHER): Payer: BLUE CROSS/BLUE SHIELD | Admitting: Family Medicine

## 2017-02-19 ENCOUNTER — Encounter: Payer: Self-pay | Admitting: Family Medicine

## 2017-02-19 VITALS — BP 136/50 | HR 70 | Ht 62.0 in | Wt 149.0 lb

## 2017-02-19 DIAGNOSIS — Z1159 Encounter for screening for other viral diseases: Secondary | ICD-10-CM

## 2017-02-19 DIAGNOSIS — Z1231 Encounter for screening mammogram for malignant neoplasm of breast: Secondary | ICD-10-CM | POA: Diagnosis not present

## 2017-02-19 DIAGNOSIS — Z0000 Encounter for general adult medical examination without abnormal findings: Secondary | ICD-10-CM | POA: Diagnosis not present

## 2017-02-19 DIAGNOSIS — Z952 Presence of prosthetic heart valve: Secondary | ICD-10-CM | POA: Diagnosis not present

## 2017-02-19 DIAGNOSIS — Z114 Encounter for screening for human immunodeficiency virus [HIV]: Secondary | ICD-10-CM

## 2017-02-19 DIAGNOSIS — Z7901 Long term (current) use of anticoagulants: Secondary | ICD-10-CM | POA: Diagnosis not present

## 2017-02-19 DIAGNOSIS — I4892 Unspecified atrial flutter: Secondary | ICD-10-CM | POA: Diagnosis not present

## 2017-02-19 MED ORDER — CRESTOR 10 MG PO TABS: ORAL_TABLET | ORAL | 3 refills | Status: DC

## 2017-02-19 NOTE — Patient Instructions (Addendum)

## 2017-02-19 NOTE — Progress Notes (Signed)
Subjective:     Erin Hill is a 62 y.o. female and is here for a comprehensive physical exam. The patient reports no problems.  Is doing well overall.  Previously she was experiencing some chest pain with exercise and had a full cardiac workup to make sure that she did not have any coronary artery disease.  They felt like it was reflux related to have placed her on a PPI for 30 days.  She is doing better overall but has held off on working out for now.  Social History   Socioeconomic History  . Marital status: Married    Spouse name: Theron Arista  . Number of children: Not on file  . Years of education: Not on file  . Highest education level: Not on file  Social Needs  . Financial resource strain: Not on file  . Food insecurity - worry: Not on file  . Food insecurity - inability: Not on file  . Transportation needs - medical: Not on file  . Transportation needs - non-medical: Not on file  Occupational History  . Occupation: Printmaker: [employer]  Tobacco Use  . Smoking status: Never Smoker  . Smokeless tobacco: Never Used  Substance and Sexual Activity  . Alcohol use: No  . Drug use: No  . Sexual activity: Yes    Partners: Male  Other Topics Concern  . Not on file  Social History Narrative   No regular exercise.     Health Maintenance  Topic Date Due  . Hepatitis C Screening  Apr 27, 1955  . HIV Screening  08/16/1971  . MAMMOGRAM  07/08/2016  . COLONOSCOPY  11/14/2016  . INFLUENZA VACCINE  05/27/2017 (Originally 09/16/2016)  . PAP SMEAR  06/11/2019  . TETANUS/TDAP  07/11/2025    The following portions of the patient's history were reviewed and updated as appropriate: allergies, current medications, past family history, past medical history, past social history, past surgical history and problem list.  Review of Systems A comprehensive review of systems was negative.   Objective:    BP (!) 136/50   Pulse 70   Ht 5\' 2"  (1.575 m)   Wt 149 lb (67.6 kg)    SpO2 100%   BMI 27.25 kg/m  General appearance: alert, cooperative and appears stated age Head: Normocephalic, without obvious abnormality, atraumatic Eyes: conj clear, EOMi, PEERLA Ears: normal TM's and external ear canals both ears Nose: Nares normal. Septum midline. Mucosa normal. No drainage or sinus tenderness. Throat: lips, mucosa, and tongue normal; teeth and gums normal Neck: no adenopathy, no carotid bruit, no JVD, supple, symmetrical, trachea midline and thyroid not enlarged, symmetric, no tenderness/mass/nodules Back: symmetric, no curvature. ROM normal. No CVA tenderness. Lungs: clear to auscultation bilaterally Breasts: normal appearance, no masses or tenderness Heart: regular rate and rhythm, S1, S2 normal, no murmur, click, rub or gallop Abdomen: soft, non-tender; bowel sounds normal; no masses,  no organomegaly Extremities: extremities normal, atraumatic, no cyanosis or edema Pulses: 2+ and symmetric Skin: Skin color, texture, turgor normal. No rashes or lesions Lymph nodes: Cervical adenopathy: nl, Axillary adenopathy: nl and Supraclavicular adenopathy: nl Neurologic: Alert and oriented X 3, normal strength and tone. Normal symmetric reflexes. Normal coordination and gait    Assessment:    Healthy female exam.      Plan:     See After Visit Summary for Counseling Recommendations   Keep up a regular exercise program and make sure you are eating a healthy diet Try to eat  4 servings of dairy a day, or if you are lactose intolerant take a calcium with vitamin D daily.  Your vaccines are up to date.  Ordered mammogram Reminded her to schedule her colonoscopy.  She was actually going to do it in the fall but was started experiencing some chest pain and so ended up having a cardiac workup but plans to reschedule. Her last eye exam was a year ago. She will go for her lab work on Monday.  Will call with results once available.

## 2017-02-23 ENCOUNTER — Telehealth: Payer: Self-pay | Admitting: *Deleted

## 2017-02-23 NOTE — Telephone Encounter (Signed)
Pre Authorization sent to cover my meds. [id number]

## 2017-02-26 DIAGNOSIS — I4892 Unspecified atrial flutter: Secondary | ICD-10-CM | POA: Diagnosis not present

## 2017-02-26 DIAGNOSIS — Z114 Encounter for screening for human immunodeficiency virus [HIV]: Secondary | ICD-10-CM | POA: Diagnosis not present

## 2017-02-26 DIAGNOSIS — Z952 Presence of prosthetic heart valve: Secondary | ICD-10-CM | POA: Diagnosis not present

## 2017-02-26 DIAGNOSIS — Z7901 Long term (current) use of anticoagulants: Secondary | ICD-10-CM | POA: Diagnosis not present

## 2017-02-26 DIAGNOSIS — Z0000 Encounter for general adult medical examination without abnormal findings: Secondary | ICD-10-CM | POA: Diagnosis not present

## 2017-02-26 DIAGNOSIS — Z1159 Encounter for screening for other viral diseases: Secondary | ICD-10-CM | POA: Diagnosis not present

## 2017-02-27 LAB — COMPLETE METABOLIC PANEL WITH GFR
AG Ratio: 1.4 (calc) (ref 1.0–2.5)
ALBUMIN MSPROF: 4.3 g/dL (ref 3.6–5.1)
ALKALINE PHOSPHATASE (APISO): 61 U/L (ref 33–130)
ALT: 33 U/L — ABNORMAL HIGH (ref 6–29)
AST: 30 U/L (ref 10–35)
BILIRUBIN TOTAL: 0.8 mg/dL (ref 0.2–1.2)
BUN: 17 mg/dL (ref 7–25)
CHLORIDE: 102 mmol/L (ref 98–110)
CO2: 30 mmol/L (ref 20–32)
CREATININE: 0.73 mg/dL (ref 0.50–0.99)
Calcium: 9.7 mg/dL (ref 8.6–10.4)
GFR, EST AFRICAN AMERICAN: 104 mL/min/{1.73_m2} (ref >=60)
GFR, Est Non African American: 90 mL/min/{1.73_m2} (ref >=60)
GLOBULIN: 3 g/dL (ref 1.9–3.7)
Glucose, Bld: 133 mg/dL — ABNORMAL HIGH (ref 65–99)
Potassium: 4 mmol/L (ref 3.5–5.3)
SODIUM: 140 mmol/L (ref 135–146)
TOTAL PROTEIN: 7.3 g/dL (ref 6.1–8.1)

## 2017-02-27 LAB — LIPID PANEL W/REFLEX DIRECT LDL
CHOL/HDL RATIO: 6.7 (calc) — AB (ref <5.0)
CHOLESTEROL: 280 mg/dL — AB (ref <200)
HDL: 42 mg/dL — ABNORMAL LOW (ref >50)
LDL CHOLESTEROL (CALC): 205 mg/dL — AB
Non-HDL Cholesterol (Calc): 238 mg/dL (calc) — ABNORMAL HIGH (ref <130)
Triglycerides: 167 mg/dL — ABNORMAL HIGH (ref <150)

## 2017-02-27 LAB — HEPATITIS C ANTIBODY
Hepatitis C Ab: NONREACTIVE
SIGNAL TO CUT-OFF: 0.02 (ref <1.00)

## 2017-02-27 LAB — HIV ANTIBODY (ROUTINE TESTING W REFLEX): HIV: NONREACTIVE

## 2017-03-02 NOTE — Telephone Encounter (Signed)
Which med is this?

## 2017-03-02 NOTE — Telephone Encounter (Signed)
Correction: key is [id number] Information regarding your request  Your PA request has been denied. Additional information will be provided in the denial communication. (Message [phone])

## 2017-03-03 ENCOUNTER — Telehealth: Payer: Self-pay | Admitting: Family Medicine

## 2017-03-03 ENCOUNTER — Other Ambulatory Visit: Payer: Self-pay | Admitting: *Deleted

## 2017-03-03 DIAGNOSIS — R7301 Impaired fasting glucose: Secondary | ICD-10-CM

## 2017-03-03 DIAGNOSIS — R748 Abnormal levels of other serum enzymes: Secondary | ICD-10-CM

## 2017-03-03 NOTE — Telephone Encounter (Signed)
Oh sorry! This was the Crestor

## 2017-03-03 NOTE — Telephone Encounter (Signed)
Pt stated that she did try the generic, it caused joint pain and stomach upset. She did get a letter from her insurance and was told she would need to try an fail 3 different meds before they would approve the crestor. She is willing to do this. Will fwd to pcp for review.Loralee PacasBarkley, Trishia Cuthrell HoopestonLynetta

## 2017-03-03 NOTE — Telephone Encounter (Signed)
Call patient: Her insurance has denied Crestor.  Please see if she would be willing to take the generic.  I cannot remember if she is Artie tried this before.

## 2017-03-05 DIAGNOSIS — I4892 Unspecified atrial flutter: Secondary | ICD-10-CM | POA: Diagnosis not present

## 2017-03-05 DIAGNOSIS — Z7901 Long term (current) use of anticoagulants: Secondary | ICD-10-CM | POA: Diagnosis not present

## 2017-03-05 DIAGNOSIS — Z952 Presence of prosthetic heart valve: Secondary | ICD-10-CM | POA: Diagnosis not present

## 2017-03-08 MED ORDER — ATORVASTATIN CALCIUM 40 MG PO TABS: 40.0000 mg | ORAL_TABLET | Freq: Every day | ORAL | 0 refills | Status: DC

## 2017-03-08 NOTE — Telephone Encounter (Signed)
lvm informing pt of recommendations. .Anjelo Pullman [name]  

## 2017-03-08 NOTE — Telephone Encounter (Signed)
Ok will try lipitor. New rx sent to pharmacy.  Call if any problems.  If tolerating well then plan to recheck lipids in 2-3 months just to make sure that the dosing is adequate.  There is not an exact conversion between dosing of the Crestor and the Lipitor.

## 2017-03-16 ENCOUNTER — Telehealth: Payer: Self-pay

## 2017-03-16 DIAGNOSIS — I4892 Unspecified atrial flutter: Secondary | ICD-10-CM | POA: Diagnosis not present

## 2017-03-16 DIAGNOSIS — Z952 Presence of prosthetic heart valve: Secondary | ICD-10-CM | POA: Diagnosis not present

## 2017-03-16 DIAGNOSIS — Z7901 Long term (current) use of anticoagulants: Secondary | ICD-10-CM | POA: Diagnosis not present

## 2017-03-16 MED ORDER — PITAVASTATIN CALCIUM 2 MG PO TABS: 1.0000 | ORAL_TABLET | Freq: Every day | ORAL | 1 refills | Status: DC

## 2017-03-16 NOTE — Telephone Encounter (Signed)
OK, please add to intolerance list.  Will see if insurance will cover livalo.  I will send over a new Rx

## 2017-03-16 NOTE — Telephone Encounter (Signed)
Erin Hill called and she states she started Atorvastatin about 2 weeks ago. The last few nights she has had stomach pain, just below sternum, and nausea after taking the Atorvastatin. She believes this to be a side effect of the medication. Denies diarrhea, vomiting, fever, chills or sweats. Please advise.

## 2017-03-17 ENCOUNTER — Encounter: Payer: Self-pay | Admitting: Family Medicine

## 2017-03-17 NOTE — Telephone Encounter (Signed)
Left message advising of recommendations. Added to allergy list and removed from medication list.

## 2017-03-23 DIAGNOSIS — Z952 Presence of prosthetic heart valve: Secondary | ICD-10-CM | POA: Diagnosis not present

## 2017-03-23 DIAGNOSIS — I4892 Unspecified atrial flutter: Secondary | ICD-10-CM | POA: Diagnosis not present

## 2017-03-23 DIAGNOSIS — Z7901 Long term (current) use of anticoagulants: Secondary | ICD-10-CM | POA: Diagnosis not present

## 2017-03-30 DIAGNOSIS — Z952 Presence of prosthetic heart valve: Secondary | ICD-10-CM | POA: Diagnosis not present

## 2017-03-30 DIAGNOSIS — Z7901 Long term (current) use of anticoagulants: Secondary | ICD-10-CM | POA: Diagnosis not present

## 2017-03-30 DIAGNOSIS — I4892 Unspecified atrial flutter: Secondary | ICD-10-CM | POA: Diagnosis not present

## 2017-04-06 DIAGNOSIS — Z952 Presence of prosthetic heart valve: Secondary | ICD-10-CM | POA: Diagnosis not present

## 2017-04-06 DIAGNOSIS — Z7901 Long term (current) use of anticoagulants: Secondary | ICD-10-CM | POA: Diagnosis not present

## 2017-04-06 DIAGNOSIS — I4892 Unspecified atrial flutter: Secondary | ICD-10-CM | POA: Diagnosis not present

## 2017-04-13 DIAGNOSIS — Z7901 Long term (current) use of anticoagulants: Secondary | ICD-10-CM | POA: Diagnosis not present

## 2017-04-13 DIAGNOSIS — I4892 Unspecified atrial flutter: Secondary | ICD-10-CM | POA: Diagnosis not present

## 2017-04-20 DIAGNOSIS — Z7901 Long term (current) use of anticoagulants: Secondary | ICD-10-CM | POA: Diagnosis not present

## 2017-04-20 DIAGNOSIS — I4892 Unspecified atrial flutter: Secondary | ICD-10-CM | POA: Diagnosis not present

## 2017-04-20 DIAGNOSIS — Z952 Presence of prosthetic heart valve: Secondary | ICD-10-CM | POA: Diagnosis not present

## 2017-04-21 DIAGNOSIS — Z952 Presence of prosthetic heart valve: Secondary | ICD-10-CM | POA: Diagnosis not present

## 2017-04-27 DIAGNOSIS — Z952 Presence of prosthetic heart valve: Secondary | ICD-10-CM | POA: Diagnosis not present

## 2017-04-27 DIAGNOSIS — Z7901 Long term (current) use of anticoagulants: Secondary | ICD-10-CM | POA: Diagnosis not present

## 2017-04-27 DIAGNOSIS — I4892 Unspecified atrial flutter: Secondary | ICD-10-CM | POA: Diagnosis not present

## 2017-05-05 DIAGNOSIS — Z7901 Long term (current) use of anticoagulants: Secondary | ICD-10-CM | POA: Diagnosis not present

## 2017-05-05 DIAGNOSIS — I4892 Unspecified atrial flutter: Secondary | ICD-10-CM | POA: Diagnosis not present

## 2017-05-05 DIAGNOSIS — Z952 Presence of prosthetic heart valve: Secondary | ICD-10-CM | POA: Diagnosis not present

## 2017-05-11 DIAGNOSIS — I4892 Unspecified atrial flutter: Secondary | ICD-10-CM | POA: Diagnosis not present

## 2017-05-11 DIAGNOSIS — Z7901 Long term (current) use of anticoagulants: Secondary | ICD-10-CM | POA: Diagnosis not present

## 2017-05-11 DIAGNOSIS — Z952 Presence of prosthetic heart valve: Secondary | ICD-10-CM | POA: Diagnosis not present

## 2017-05-18 DIAGNOSIS — I4892 Unspecified atrial flutter: Secondary | ICD-10-CM | POA: Diagnosis not present

## 2017-05-18 DIAGNOSIS — Z952 Presence of prosthetic heart valve: Secondary | ICD-10-CM | POA: Diagnosis not present

## 2017-05-18 DIAGNOSIS — Z7901 Long term (current) use of anticoagulants: Secondary | ICD-10-CM | POA: Diagnosis not present

## 2017-05-21 DIAGNOSIS — Z952 Presence of prosthetic heart valve: Secondary | ICD-10-CM | POA: Diagnosis not present

## 2017-05-25 DIAGNOSIS — I4892 Unspecified atrial flutter: Secondary | ICD-10-CM | POA: Diagnosis not present

## 2017-05-25 DIAGNOSIS — Z7901 Long term (current) use of anticoagulants: Secondary | ICD-10-CM | POA: Diagnosis not present

## 2017-05-25 DIAGNOSIS — Z952 Presence of prosthetic heart valve: Secondary | ICD-10-CM | POA: Diagnosis not present

## 2017-06-01 DIAGNOSIS — Z7901 Long term (current) use of anticoagulants: Secondary | ICD-10-CM | POA: Diagnosis not present

## 2017-06-01 DIAGNOSIS — I4892 Unspecified atrial flutter: Secondary | ICD-10-CM | POA: Diagnosis not present

## 2017-06-01 DIAGNOSIS — Z952 Presence of prosthetic heart valve: Secondary | ICD-10-CM | POA: Diagnosis not present

## 2017-06-01 IMAGING — DX DG SHOULDER 2+V*R*
3 series · 3 of 3 positions shown · non-contrast
Comparison: None.

CLINICAL DATA: Right shoulder pain for 1 year.  No injury.

EXAM:
RIGHT SHOULDER - 2+ VIEW

[shoulder grashey]
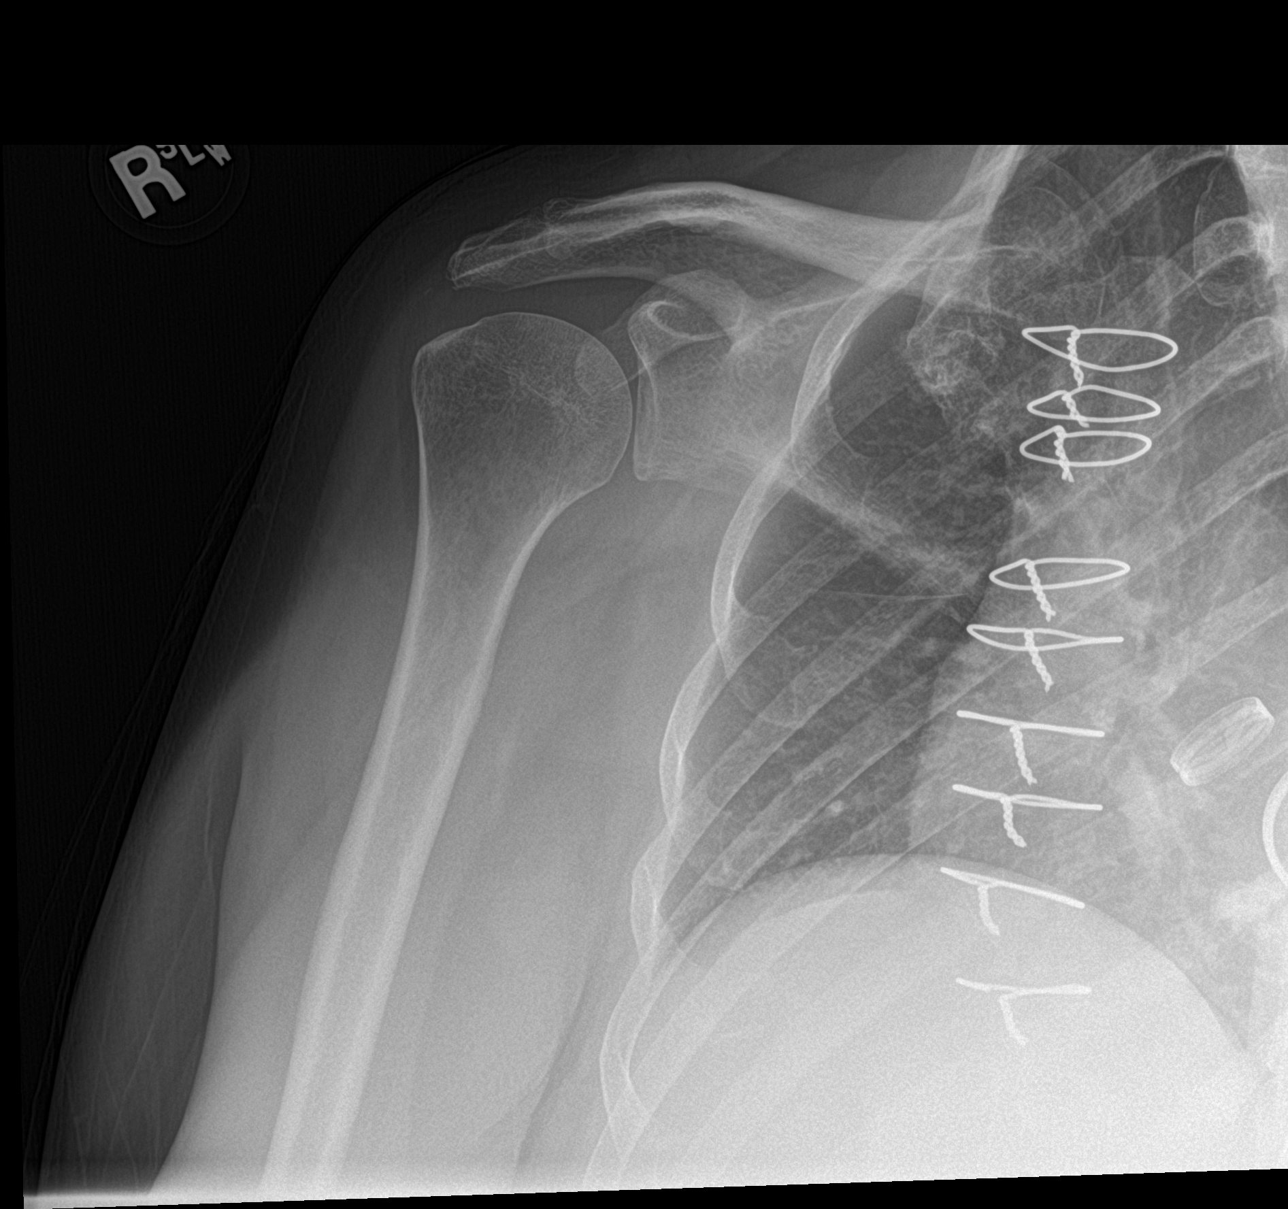

[shoulder y view]
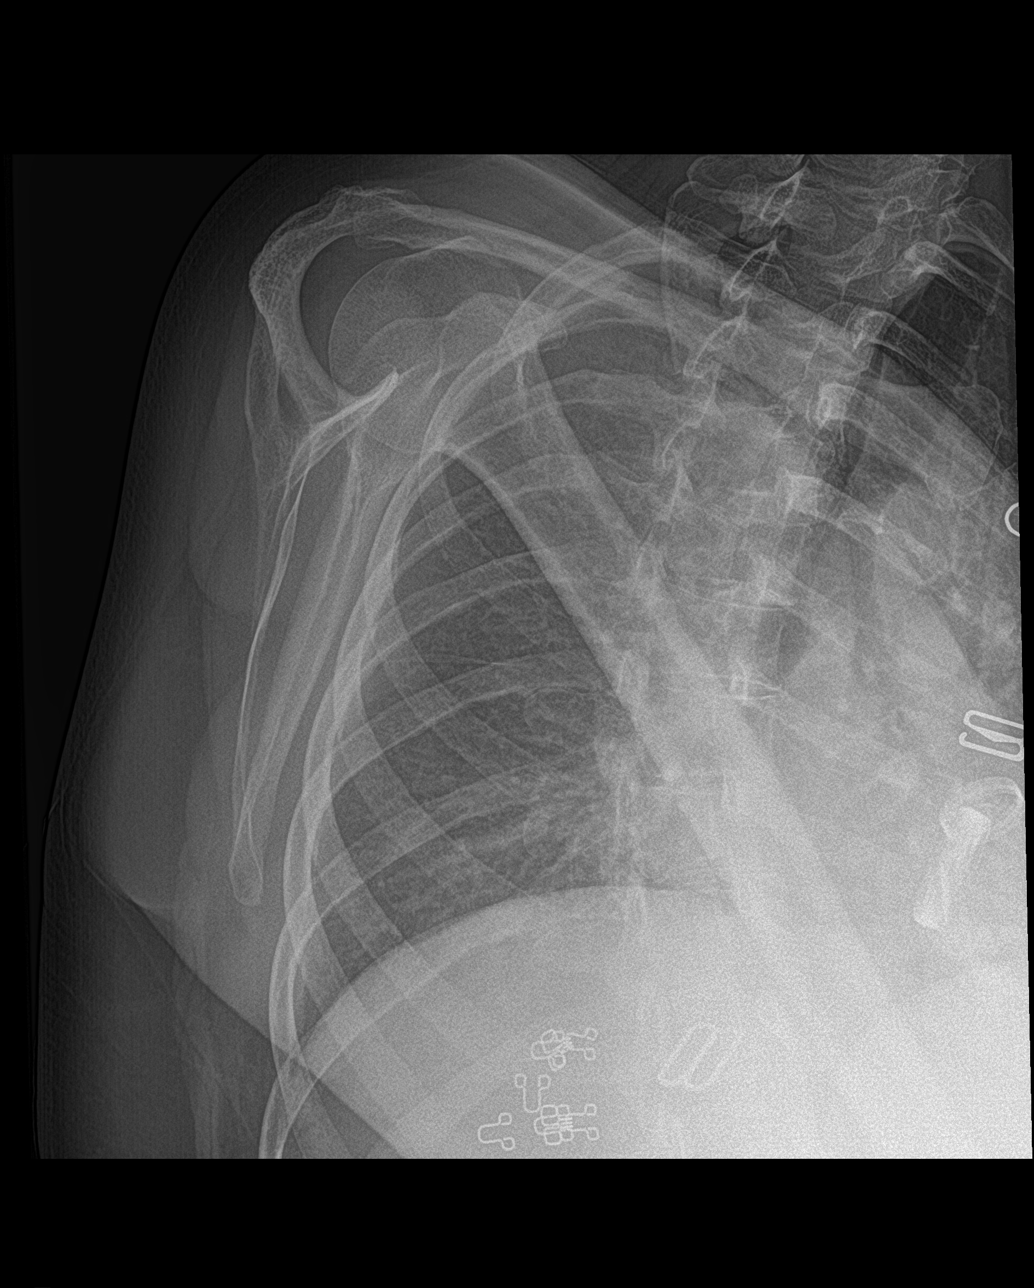

[shoulder axillary]
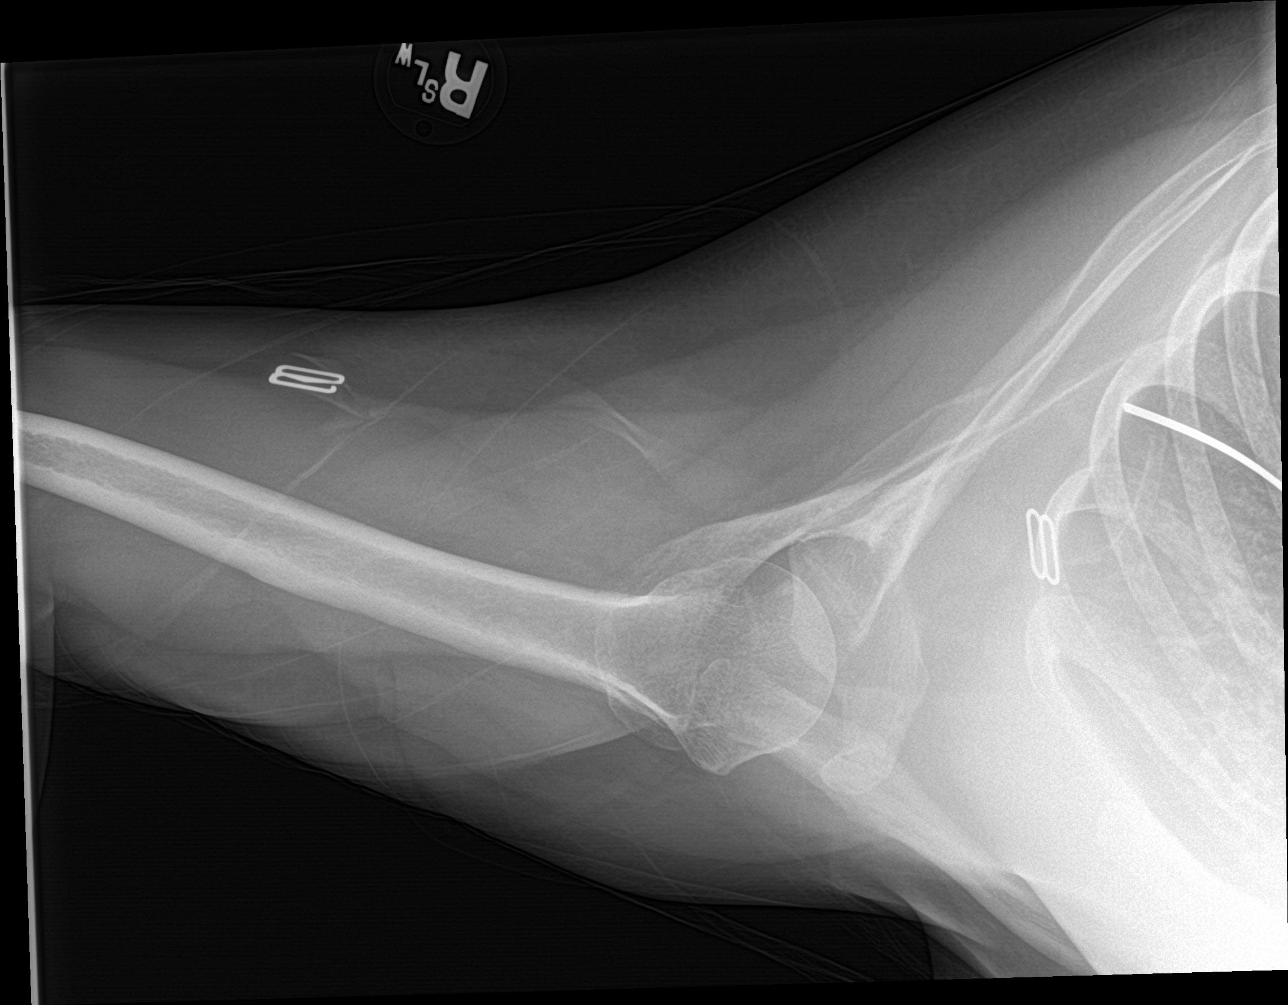

[3 of 3 positions shown; findings below may reference images not displayed]

FINDINGS: There is no evidence of fracture or dislocation. There is no
evidence of arthropathy or other focal bone abnormality. Soft
tissues are unremarkable.
IMPRESSION: Negative.

## 2017-06-03 ENCOUNTER — Other Ambulatory Visit: Payer: Self-pay | Admitting: Family Medicine

## 2017-06-08 DIAGNOSIS — Z7901 Long term (current) use of anticoagulants: Secondary | ICD-10-CM | POA: Diagnosis not present

## 2017-06-08 DIAGNOSIS — I4892 Unspecified atrial flutter: Secondary | ICD-10-CM | POA: Diagnosis not present

## 2017-06-08 DIAGNOSIS — Z952 Presence of prosthetic heart valve: Secondary | ICD-10-CM | POA: Diagnosis not present

## 2017-06-16 DIAGNOSIS — I4892 Unspecified atrial flutter: Secondary | ICD-10-CM | POA: Diagnosis not present

## 2017-06-16 DIAGNOSIS — Z952 Presence of prosthetic heart valve: Secondary | ICD-10-CM | POA: Diagnosis not present

## 2017-06-16 DIAGNOSIS — Z7901 Long term (current) use of anticoagulants: Secondary | ICD-10-CM | POA: Diagnosis not present

## 2017-06-18 DIAGNOSIS — Z952 Presence of prosthetic heart valve: Secondary | ICD-10-CM | POA: Diagnosis not present

## 2017-06-22 DIAGNOSIS — Z952 Presence of prosthetic heart valve: Secondary | ICD-10-CM | POA: Diagnosis not present

## 2017-06-22 DIAGNOSIS — Z7901 Long term (current) use of anticoagulants: Secondary | ICD-10-CM | POA: Diagnosis not present

## 2017-06-22 DIAGNOSIS — I4892 Unspecified atrial flutter: Secondary | ICD-10-CM | POA: Diagnosis not present

## 2017-06-29 DIAGNOSIS — Z952 Presence of prosthetic heart valve: Secondary | ICD-10-CM | POA: Diagnosis not present

## 2017-06-29 DIAGNOSIS — I4892 Unspecified atrial flutter: Secondary | ICD-10-CM | POA: Diagnosis not present

## 2017-06-29 DIAGNOSIS — Z7901 Long term (current) use of anticoagulants: Secondary | ICD-10-CM | POA: Diagnosis not present

## 2017-07-02 DIAGNOSIS — Z952 Presence of prosthetic heart valve: Secondary | ICD-10-CM | POA: Diagnosis not present

## 2017-07-02 DIAGNOSIS — I099 Rheumatic heart disease, unspecified: Secondary | ICD-10-CM | POA: Diagnosis not present

## 2017-07-02 DIAGNOSIS — I10 Essential (primary) hypertension: Secondary | ICD-10-CM | POA: Diagnosis not present

## 2017-07-02 DIAGNOSIS — E7801 Familial hypercholesterolemia: Secondary | ICD-10-CM | POA: Diagnosis not present

## 2017-07-06 DIAGNOSIS — Z7901 Long term (current) use of anticoagulants: Secondary | ICD-10-CM | POA: Diagnosis not present

## 2017-07-06 DIAGNOSIS — I4892 Unspecified atrial flutter: Secondary | ICD-10-CM | POA: Diagnosis not present

## 2017-07-06 DIAGNOSIS — Z952 Presence of prosthetic heart valve: Secondary | ICD-10-CM | POA: Diagnosis not present

## 2017-07-13 DIAGNOSIS — Z952 Presence of prosthetic heart valve: Secondary | ICD-10-CM | POA: Diagnosis not present

## 2017-07-13 DIAGNOSIS — I4892 Unspecified atrial flutter: Secondary | ICD-10-CM | POA: Diagnosis not present

## 2017-07-13 DIAGNOSIS — Z7901 Long term (current) use of anticoagulants: Secondary | ICD-10-CM | POA: Diagnosis not present

## 2017-07-20 DIAGNOSIS — E785 Hyperlipidemia, unspecified: Secondary | ICD-10-CM | POA: Diagnosis not present

## 2017-07-20 DIAGNOSIS — I517 Cardiomegaly: Secondary | ICD-10-CM | POA: Diagnosis not present

## 2017-07-20 DIAGNOSIS — I249 Acute ischemic heart disease, unspecified: Secondary | ICD-10-CM | POA: Diagnosis not present

## 2017-07-20 DIAGNOSIS — I10 Essential (primary) hypertension: Secondary | ICD-10-CM | POA: Diagnosis not present

## 2017-07-20 DIAGNOSIS — I071 Rheumatic tricuspid insufficiency: Secondary | ICD-10-CM | POA: Diagnosis not present

## 2017-07-20 DIAGNOSIS — R079 Chest pain, unspecified: Secondary | ICD-10-CM | POA: Insufficient documentation

## 2017-07-20 DIAGNOSIS — I2510 Atherosclerotic heart disease of native coronary artery without angina pectoris: Secondary | ICD-10-CM | POA: Diagnosis not present

## 2017-07-20 DIAGNOSIS — Z7901 Long term (current) use of anticoagulants: Secondary | ICD-10-CM | POA: Diagnosis not present

## 2017-07-20 DIAGNOSIS — I082 Rheumatic disorders of both aortic and tricuspid valves: Secondary | ICD-10-CM | POA: Diagnosis not present

## 2017-07-20 DIAGNOSIS — K219 Gastro-esophageal reflux disease without esophagitis: Secondary | ICD-10-CM | POA: Diagnosis not present

## 2017-07-20 DIAGNOSIS — Z79899 Other long term (current) drug therapy: Secondary | ICD-10-CM | POA: Diagnosis not present

## 2017-07-20 DIAGNOSIS — R0789 Other chest pain: Secondary | ICD-10-CM | POA: Diagnosis not present

## 2017-07-20 DIAGNOSIS — I214 Non-ST elevation (NSTEMI) myocardial infarction: Secondary | ICD-10-CM | POA: Diagnosis not present

## 2017-07-20 DIAGNOSIS — R791 Abnormal coagulation profile: Secondary | ICD-10-CM | POA: Diagnosis not present

## 2017-07-20 DIAGNOSIS — E119 Type 2 diabetes mellitus without complications: Secondary | ICD-10-CM | POA: Diagnosis not present

## 2017-07-20 DIAGNOSIS — Z8249 Family history of ischemic heart disease and other diseases of the circulatory system: Secondary | ICD-10-CM | POA: Diagnosis not present

## 2017-07-20 DIAGNOSIS — I272 Pulmonary hypertension, unspecified: Secondary | ICD-10-CM | POA: Diagnosis not present

## 2017-07-20 DIAGNOSIS — E7801 Familial hypercholesterolemia: Secondary | ICD-10-CM | POA: Diagnosis not present

## 2017-07-20 DIAGNOSIS — I4892 Unspecified atrial flutter: Secondary | ICD-10-CM | POA: Diagnosis not present

## 2017-07-20 DIAGNOSIS — Z952 Presence of prosthetic heart valve: Secondary | ICD-10-CM | POA: Diagnosis not present

## 2017-07-21 DIAGNOSIS — E1169 Type 2 diabetes mellitus with other specified complication: Secondary | ICD-10-CM | POA: Insufficient documentation

## 2017-07-21 DIAGNOSIS — E785 Hyperlipidemia, unspecified: Secondary | ICD-10-CM | POA: Diagnosis not present

## 2017-07-21 DIAGNOSIS — I082 Rheumatic disorders of both aortic and tricuspid valves: Secondary | ICD-10-CM | POA: Diagnosis not present

## 2017-07-21 DIAGNOSIS — R079 Chest pain, unspecified: Secondary | ICD-10-CM | POA: Diagnosis not present

## 2017-07-21 DIAGNOSIS — I10 Essential (primary) hypertension: Secondary | ICD-10-CM | POA: Diagnosis not present

## 2017-07-21 DIAGNOSIS — I272 Pulmonary hypertension, unspecified: Secondary | ICD-10-CM | POA: Diagnosis not present

## 2017-07-21 DIAGNOSIS — E1129 Type 2 diabetes mellitus with other diabetic kidney complication: Secondary | ICD-10-CM | POA: Insufficient documentation

## 2017-07-21 DIAGNOSIS — E119 Type 2 diabetes mellitus without complications: Secondary | ICD-10-CM | POA: Insufficient documentation

## 2017-07-21 DIAGNOSIS — Z952 Presence of prosthetic heart valve: Secondary | ICD-10-CM | POA: Diagnosis not present

## 2017-07-21 DIAGNOSIS — I517 Cardiomegaly: Secondary | ICD-10-CM | POA: Diagnosis not present

## 2017-07-21 DIAGNOSIS — I214 Non-ST elevation (NSTEMI) myocardial infarction: Secondary | ICD-10-CM | POA: Diagnosis not present

## 2017-07-22 DIAGNOSIS — Z7901 Long term (current) use of anticoagulants: Secondary | ICD-10-CM | POA: Diagnosis not present

## 2017-07-22 DIAGNOSIS — I249 Acute ischemic heart disease, unspecified: Secondary | ICD-10-CM | POA: Diagnosis not present

## 2017-07-22 DIAGNOSIS — E785 Hyperlipidemia, unspecified: Secondary | ICD-10-CM | POA: Diagnosis not present

## 2017-07-22 DIAGNOSIS — I10 Essential (primary) hypertension: Secondary | ICD-10-CM | POA: Diagnosis not present

## 2017-07-22 DIAGNOSIS — I214 Non-ST elevation (NSTEMI) myocardial infarction: Secondary | ICD-10-CM | POA: Diagnosis not present

## 2017-07-22 DIAGNOSIS — R079 Chest pain, unspecified: Secondary | ICD-10-CM | POA: Diagnosis not present

## 2017-07-23 DIAGNOSIS — I214 Non-ST elevation (NSTEMI) myocardial infarction: Secondary | ICD-10-CM | POA: Diagnosis not present

## 2017-07-23 DIAGNOSIS — E785 Hyperlipidemia, unspecified: Secondary | ICD-10-CM | POA: Diagnosis not present

## 2017-07-23 DIAGNOSIS — I2510 Atherosclerotic heart disease of native coronary artery without angina pectoris: Secondary | ICD-10-CM | POA: Diagnosis not present

## 2017-07-23 DIAGNOSIS — I10 Essential (primary) hypertension: Secondary | ICD-10-CM | POA: Diagnosis not present

## 2017-07-23 DIAGNOSIS — R079 Chest pain, unspecified: Secondary | ICD-10-CM | POA: Diagnosis not present

## 2017-07-24 DIAGNOSIS — E785 Hyperlipidemia, unspecified: Secondary | ICD-10-CM | POA: Diagnosis not present

## 2017-07-24 DIAGNOSIS — R0789 Other chest pain: Secondary | ICD-10-CM | POA: Diagnosis not present

## 2017-07-24 DIAGNOSIS — R079 Chest pain, unspecified: Secondary | ICD-10-CM | POA: Diagnosis not present

## 2017-07-24 DIAGNOSIS — I214 Non-ST elevation (NSTEMI) myocardial infarction: Secondary | ICD-10-CM | POA: Diagnosis not present

## 2017-07-24 DIAGNOSIS — I10 Essential (primary) hypertension: Secondary | ICD-10-CM | POA: Diagnosis not present

## 2017-07-27 ENCOUNTER — Telehealth: Payer: Self-pay

## 2017-07-27 DIAGNOSIS — R7301 Impaired fasting glucose: Secondary | ICD-10-CM

## 2017-07-27 NOTE — Telephone Encounter (Signed)
Ok to send Rx for strip, etc.

## 2017-07-27 NOTE — Telephone Encounter (Signed)
Pt was seen in the hospital for episode of chest pain and was discharged on 07-24-17. Pt states heart cath was done and everything was normal. Only findings were that chest pain was caused by reflux and it was also noted that she had an A1C of 6.6%.   At ER, pt states she was informed she is diabetic and needs to start testing her blood sugars daily. She was given RX for meter and strips from ER, but her ins. Does not cover them.  Hosp. F/u is scheduled for 08-12-17 (pt is going out of town for a week and a half) but wants to know if Dr Linford ArnoldMetheney can call in new RX for meter strips and lancets that her insurance will cover, Accu-chek.   Please advise if this is okay to go ahead and call in.   Thanks!

## 2017-07-28 MED ORDER — GLUCOSE BLOOD VI STRP
ORAL_STRIP | 12 refills | Status: DC
Start: 2017-07-28 — End: 2018-12-19

## 2017-07-28 MED ORDER — ACCU-CHEK AVIVA PLUS W/DEVICE KIT: PACK | 99 refills | Status: DC

## 2017-07-28 MED ORDER — ACCU-CHEK MULTICLIX LANCET DEV KIT: PACK | 99 refills | Status: DC

## 2017-07-28 MED ORDER — ACCU-CHEK MULTICLIX LANCETS MISC: 12 refills | Status: DC

## 2017-07-28 NOTE — Telephone Encounter (Signed)
Rx sent. Left VM for Pt with status update.

## 2017-08-02 ENCOUNTER — Telehealth: Payer: Self-pay

## 2017-08-02 NOTE — Telephone Encounter (Signed)
Left message for patient to call back  

## 2017-08-02 NOTE — Telephone Encounter (Signed)
She is scheduled for Wednesday with you. I left a message advising patient to call Cardiologist.

## 2017-08-02 NOTE — Telephone Encounter (Signed)
I think she should probably call her cardiologist just to see if they feel like she should be seen in their office since her recent hospitalization was for chest pain.  If they do not or are not willing to see her then we can try to get her in acutely.  Is She scheduled for a hospital follow-up?

## 2017-08-02 NOTE — Telephone Encounter (Signed)
If she can come in at 10:30 today I can see her.

## 2017-08-02 NOTE — Telephone Encounter (Signed)
Erin Hill was released from the hospital about a week ago. Since she left she has had neck pain, shoulder pain, nausea and dizziness. Her [name] was subtherapeutic and they gave her Lovenox. She reports they gave her Protonix bid.

## 2017-08-04 ENCOUNTER — Encounter: Payer: Self-pay | Admitting: Family Medicine

## 2017-08-04 ENCOUNTER — Ambulatory Visit (INDEPENDENT_AMBULATORY_CARE_PROVIDER_SITE_OTHER): Payer: BLUE CROSS/BLUE SHIELD | Admitting: Family Medicine

## 2017-08-04 VITALS — BP 99/45 | HR 86 | Ht 62.0 in | Wt 141.0 lb

## 2017-08-04 DIAGNOSIS — R7301 Impaired fasting glucose: Secondary | ICD-10-CM | POA: Diagnosis not present

## 2017-08-04 DIAGNOSIS — R0789 Other chest pain: Secondary | ICD-10-CM

## 2017-08-04 DIAGNOSIS — Z952 Presence of prosthetic heart valve: Secondary | ICD-10-CM

## 2017-08-04 DIAGNOSIS — Z9889 Other specified postprocedural states: Secondary | ICD-10-CM

## 2017-08-04 DIAGNOSIS — I10 Essential (primary) hypertension: Secondary | ICD-10-CM

## 2017-08-04 NOTE — Progress Notes (Signed)
Subjective:    Patient ID: Erin Hill, female    DOB: 1955-12-27, 62 y.o.   MRN: [id number]  HPI History of female is here today for hospital follow-up.  She presented to the emergency department on June 4 complaining of left-sided chest pain radiating down her left arm.  She was admitted and evaluated for ischemic disease.  She had a cardiac cath and had not had any type of cardiac event.  She did have a supratherapeutic [name].   Overall she is feeling much better and back to baseline.  She has not had any more chest pain. Felt like some of her pain could be related to GERD and gastritis so they recommended putting her on Protonix 40 mg twice a day.  She says it has been upsetting her stomach and so has stopped it.  She actually bought an over-the-counter supplement for to help with the GI tract and wanted to know if she could take that.  She is also been much more careful with her dietary choices.  She did have an endoscopy during the hospitalization on the day of discharge revealing some patulous GE junction which they felt could be a nidus for GERD.  She is scheduled for an outpatient follow-up with GI.  Hypertension- Pt denies chest pain, SOB, dizziness, or heart palpitations.  Taking meds as directed w/o problems.  Denies medication side effects.    That is post mitral valve and atrial valve replacement in 1998 secondary to rheumatic heart disease.  She was on Lovenox during her hospitalization was able to come off Monday and has been therapeutic on her Coumadin.  She follows with the Coumadin clinic.  IFG-I did her hemoglobin A1c during hospitalization which was at 6.6.  Review of Systems  BP (!) 99/45   Pulse 86   Ht '5\' 2"'$  (1.575 m)   Wt 141 lb (64 kg)   SpO2 100%   BMI 25.79 kg/m     Allergies  Allergen Reactions  . Gemfibrozil Other (See Comments)    Acid reflux  . Shellfish Allergy     Other reaction(s): Other Whelps all over her body  . Erythromycin     REACTION: renal  toxicity  . Lipitor [Atorvastatin] Nausea Only    Stomach pain  . Metformin And Related Other (See Comments)    Nausea, loose stools, fatigue   . Protonix [Pantoprazole Sodium] Other (See Comments)    Nause, fatigue, muscle cramping.   . Versed [Midazolam]     REACTION: rash, fever  . Contrast Media [Iodinated Diagnostic Agents] Rash    Fever  . Iodine Rash    Contrast media    Past Medical History:  Diagnosis Date  . Atrial flutter ([place])    2015  . Hearing loss    chronic  . Miscarriage    X 2  . Proteinuria    chronic (not related to DM)  . Rheumatic heart disease    s/p aortic/ mitral valve replace    Past Surgical History:  Procedure Laterality Date  . double heart valve replacement  1999  . echo EF 75-80%    . SAB     X 2  . stapes surgery, left  2003   then removed 2 years later  . TAB      X 1  . TONSILLECTOMY  as a child    Social History   Socioeconomic History  . Marital status: Married    Spouse name: Collier Salina  .  Number of children: Not on file  . Years of education: Not on file  . Highest education level: Not on file  Occupational History  . Occupation: Retail banker: [employer]  . Financial resource strain: Not on file  . Food insecurity:    Worry: Not on file    Inability: Not on file  . Transportation needs:    Medical: Not on file    Non-medical: Not on file  Tobacco Use  . Smoking status: Never Smoker  . Smokeless tobacco: Never Used  Substance and Sexual Activity  . Alcohol use: No  . Drug use: No  . Sexual activity: Yes    Partners: Male  Lifestyle  . Physical activity:    Days per week: Not on file    Minutes per session: Not on file  . Stress: Not on file  Relationships  . Social connections:    Talks on phone: Not on file    Gets together: Not on file    Attends religious service: Not on file    Active member of club or organization: Not on file    Attends meetings of clubs or  organizations: Not on file    Relationship status: Not on file  . Intimate partner violence:    Fear of current or ex partner: Not on file    Emotionally abused: Not on file    Physically abused: Not on file    Forced sexual activity: Not on file  Other Topics Concern  . Not on file  Social History Narrative   No regular exercise.      Family History  Problem Relation Age of Onset  . Diabetes Father   . Lung cancer Mother        smoked until her 1's  . Hypertension Mother   . Diabetes Father   . Hyperlipidemia Father   . Hypertension Father     Outpatient Encounter Medications as of 08/04/2017  Medication Sig  . atorvastatin (LIPITOR) 40 MG tablet Take 1 tablet (40 mg total) by mouth at bedtime. Due for repeat labs.  Marland Kitchen b complex vitamins tablet Take 1 tablet by mouth daily.  . Blood Glucose Monitoring Suppl (ACCU-CHEK AVIVA PLUS) w/Device KIT Use to check blood sugar twice daily. Dx: R73.01  . CRESTOR 10 MG tablet TAKE 1 AND 1/2 TABLET DAILY  . enoxaparin (LOVENOX) 80 MG/0.8ML injection INJECT 0.7 MLS (70 MG DOSE) INTO THE SKIN EVERY 12 (TWELVE) HOURS.  . glucose blood (ACCU-CHEK AVIVA PLUS) test strip Use to check blood sugar twice daily. Dx: R73.01  . hydrochlorothiazide (HYDRODIURIL) 25 MG tablet   . Lancets (ACCU-CHEK MULTICLIX) lancets Use to check blood sugar twice daily. Dx: R73.01  . Lancets Misc. (ACCU-CHEK MULTICLIX LANCET DEV) KIT Use to check blood sugar twice daily. Dx: R73.01  . metoprolol succinate (TOPROL-XL) 25 MG 24 hr tablet Take 25 mg by mouth daily.  . Pitavastatin Calcium 2 MG TABS Take 1 tablet (2 mg total) by mouth at bedtime.  . quinapril (ACCUPRIL) 40 MG tablet TAKE 1 TABLET BY MOUTH ONCE DAILY  . Vitamin K, Phytonadione, 100 MCG TABS Take 100 mcg daily by mouth.  . warfarin (COUMADIN) 5 MG tablet TAKE 1 TABLET (5 MG) BY MOUTH DAILY EXCEPT FOR MON/FRI TAKE 1/2 TABLET (2.'5MG'$ ) OR AS DIRECTED BY COUMADIN CLINIC.  . [DISCONTINUED] metFORMIN (GLUCOPHAGE)  500 MG tablet Take 500 mg by mouth.  . [DISCONTINUED] pantoprazole (PROTONIX) 40 MG tablet Take by  mouth.   No facility-administered encounter medications on file as of 08/04/2017.          Objective:   Physical Exam  Constitutional: She is oriented to person, place, and time. She appears well-developed and well-nourished.  HENT:  Head: Normocephalic and atraumatic.  Cardiovascular: Normal rate and regular rhythm.  Murmur heard. 4/6 murmur best heard over right sternal border.   Pulmonary/Chest: Effort normal and breath sounds normal.  Neurological: She is alert and oriented to person, place, and time.  Skin: Skin is warm and dry.  Psychiatric: She has a normal mood and affect. Her behavior is normal.          Assessment & Plan:  Atypical chest pain-resolved.  Not cardiac which is very reassuring.  GERD -as they did see the patch she GE junction I did encourage her to keep her appoint with GI for follow-up.  Since the Protonix is causing some symptoms and side effects okay to stop.  I do think she is okay to take the supplement that she showed me today.  I do not see anything in it that would be harmful and I think dietary changes would be probably the most helpful change to reduce any symptoms that she could be having.  HTN - Well controlled. Continue current regimen. Follow up in  6 months.    IFG -up until now she has not had a diagnosis of diabetes.  Plan to follow-up in 3 months for repeat hemoglobin A1c since her last one in the hospital was 6.6.  Again she is already made some significant dietary changes.  Status post mitral valve and atrial valve replacement secondary to rheumatic heart disease-now off Lovenox and just on Coumadin and following with the Coumadin clinic.  She is also taking her vitamin K which she was using previously which helps with the bruising and bleeding that she typically experiences.

## 2017-08-06 ENCOUNTER — Encounter: Payer: Self-pay | Admitting: Family Medicine

## 2017-08-12 ENCOUNTER — Inpatient Hospital Stay: Payer: BLUE CROSS/BLUE SHIELD | Admitting: Family Medicine

## 2017-08-17 DIAGNOSIS — Z7901 Long term (current) use of anticoagulants: Secondary | ICD-10-CM | POA: Diagnosis not present

## 2017-08-17 DIAGNOSIS — Z952 Presence of prosthetic heart valve: Secondary | ICD-10-CM | POA: Diagnosis not present

## 2017-08-17 DIAGNOSIS — I4892 Unspecified atrial flutter: Secondary | ICD-10-CM | POA: Diagnosis not present

## 2017-08-20 DIAGNOSIS — Z7901 Long term (current) use of anticoagulants: Secondary | ICD-10-CM | POA: Diagnosis not present

## 2017-08-20 DIAGNOSIS — Z952 Presence of prosthetic heart valve: Secondary | ICD-10-CM | POA: Diagnosis not present

## 2017-08-20 DIAGNOSIS — I4892 Unspecified atrial flutter: Secondary | ICD-10-CM | POA: Diagnosis not present

## 2017-08-20 DIAGNOSIS — K219 Gastro-esophageal reflux disease without esophagitis: Secondary | ICD-10-CM | POA: Diagnosis not present

## 2017-08-20 DIAGNOSIS — Z1211 Encounter for screening for malignant neoplasm of colon: Secondary | ICD-10-CM | POA: Diagnosis not present

## 2017-08-24 DIAGNOSIS — Z952 Presence of prosthetic heart valve: Secondary | ICD-10-CM | POA: Diagnosis not present

## 2017-08-24 DIAGNOSIS — I4892 Unspecified atrial flutter: Secondary | ICD-10-CM | POA: Diagnosis not present

## 2017-08-24 DIAGNOSIS — Z7901 Long term (current) use of anticoagulants: Secondary | ICD-10-CM | POA: Diagnosis not present

## 2017-08-31 DIAGNOSIS — Z952 Presence of prosthetic heart valve: Secondary | ICD-10-CM | POA: Diagnosis not present

## 2017-08-31 DIAGNOSIS — Z7901 Long term (current) use of anticoagulants: Secondary | ICD-10-CM | POA: Diagnosis not present

## 2017-08-31 DIAGNOSIS — I4892 Unspecified atrial flutter: Secondary | ICD-10-CM | POA: Diagnosis not present

## 2017-09-02 ENCOUNTER — Other Ambulatory Visit: Payer: Self-pay | Admitting: Family Medicine

## 2017-09-07 DIAGNOSIS — Z7901 Long term (current) use of anticoagulants: Secondary | ICD-10-CM | POA: Diagnosis not present

## 2017-09-07 DIAGNOSIS — I4892 Unspecified atrial flutter: Secondary | ICD-10-CM | POA: Diagnosis not present

## 2017-09-07 DIAGNOSIS — Z952 Presence of prosthetic heart valve: Secondary | ICD-10-CM | POA: Diagnosis not present

## 2017-09-15 DIAGNOSIS — Z7901 Long term (current) use of anticoagulants: Secondary | ICD-10-CM | POA: Diagnosis not present

## 2017-09-16 DIAGNOSIS — Z952 Presence of prosthetic heart valve: Secondary | ICD-10-CM | POA: Diagnosis not present

## 2017-09-16 DIAGNOSIS — Z7901 Long term (current) use of anticoagulants: Secondary | ICD-10-CM | POA: Diagnosis not present

## 2017-09-16 DIAGNOSIS — I4892 Unspecified atrial flutter: Secondary | ICD-10-CM | POA: Diagnosis not present

## 2017-09-21 DIAGNOSIS — Z952 Presence of prosthetic heart valve: Secondary | ICD-10-CM | POA: Diagnosis not present

## 2017-09-21 DIAGNOSIS — I4892 Unspecified atrial flutter: Secondary | ICD-10-CM | POA: Diagnosis not present

## 2017-09-21 DIAGNOSIS — Z7901 Long term (current) use of anticoagulants: Secondary | ICD-10-CM | POA: Diagnosis not present

## 2017-09-28 DIAGNOSIS — Z952 Presence of prosthetic heart valve: Secondary | ICD-10-CM | POA: Diagnosis not present

## 2017-09-28 DIAGNOSIS — Z7901 Long term (current) use of anticoagulants: Secondary | ICD-10-CM | POA: Diagnosis not present

## 2017-09-28 DIAGNOSIS — I4892 Unspecified atrial flutter: Secondary | ICD-10-CM | POA: Diagnosis not present

## 2017-10-05 DIAGNOSIS — Z952 Presence of prosthetic heart valve: Secondary | ICD-10-CM | POA: Diagnosis not present

## 2017-10-05 DIAGNOSIS — Z7901 Long term (current) use of anticoagulants: Secondary | ICD-10-CM | POA: Diagnosis not present

## 2017-10-05 DIAGNOSIS — I4892 Unspecified atrial flutter: Secondary | ICD-10-CM | POA: Diagnosis not present

## 2017-10-12 DIAGNOSIS — I4892 Unspecified atrial flutter: Secondary | ICD-10-CM | POA: Diagnosis not present

## 2017-10-12 DIAGNOSIS — Z952 Presence of prosthetic heart valve: Secondary | ICD-10-CM | POA: Diagnosis not present

## 2017-10-12 DIAGNOSIS — Z7901 Long term (current) use of anticoagulants: Secondary | ICD-10-CM | POA: Diagnosis not present

## 2017-10-19 DIAGNOSIS — Z952 Presence of prosthetic heart valve: Secondary | ICD-10-CM | POA: Diagnosis not present

## 2017-10-19 DIAGNOSIS — I4892 Unspecified atrial flutter: Secondary | ICD-10-CM | POA: Diagnosis not present

## 2017-10-19 DIAGNOSIS — Z7901 Long term (current) use of anticoagulants: Secondary | ICD-10-CM | POA: Diagnosis not present

## 2017-10-26 DIAGNOSIS — Z7901 Long term (current) use of anticoagulants: Secondary | ICD-10-CM | POA: Diagnosis not present

## 2017-10-26 DIAGNOSIS — Z952 Presence of prosthetic heart valve: Secondary | ICD-10-CM | POA: Diagnosis not present

## 2017-10-26 DIAGNOSIS — I4892 Unspecified atrial flutter: Secondary | ICD-10-CM | POA: Diagnosis not present

## 2017-11-02 DIAGNOSIS — Z7901 Long term (current) use of anticoagulants: Secondary | ICD-10-CM | POA: Diagnosis not present

## 2017-11-02 DIAGNOSIS — I4892 Unspecified atrial flutter: Secondary | ICD-10-CM | POA: Diagnosis not present

## 2017-11-02 DIAGNOSIS — Z952 Presence of prosthetic heart valve: Secondary | ICD-10-CM | POA: Diagnosis not present

## 2017-11-12 DIAGNOSIS — Z952 Presence of prosthetic heart valve: Secondary | ICD-10-CM | POA: Diagnosis not present

## 2017-11-16 DIAGNOSIS — Z952 Presence of prosthetic heart valve: Secondary | ICD-10-CM | POA: Diagnosis not present

## 2017-11-16 DIAGNOSIS — I4892 Unspecified atrial flutter: Secondary | ICD-10-CM | POA: Diagnosis not present

## 2017-11-16 DIAGNOSIS — Z7901 Long term (current) use of anticoagulants: Secondary | ICD-10-CM | POA: Diagnosis not present

## 2017-11-23 DIAGNOSIS — Z7901 Long term (current) use of anticoagulants: Secondary | ICD-10-CM | POA: Diagnosis not present

## 2017-11-23 DIAGNOSIS — I4892 Unspecified atrial flutter: Secondary | ICD-10-CM | POA: Diagnosis not present

## 2017-11-23 DIAGNOSIS — Z952 Presence of prosthetic heart valve: Secondary | ICD-10-CM | POA: Diagnosis not present

## 2017-11-30 DIAGNOSIS — Z7901 Long term (current) use of anticoagulants: Secondary | ICD-10-CM | POA: Diagnosis not present

## 2017-11-30 DIAGNOSIS — Z952 Presence of prosthetic heart valve: Secondary | ICD-10-CM | POA: Diagnosis not present

## 2017-11-30 DIAGNOSIS — I4892 Unspecified atrial flutter: Secondary | ICD-10-CM | POA: Diagnosis not present

## 2017-12-13 DIAGNOSIS — Z952 Presence of prosthetic heart valve: Secondary | ICD-10-CM | POA: Diagnosis not present

## 2017-12-14 DIAGNOSIS — E119 Type 2 diabetes mellitus without complications: Secondary | ICD-10-CM | POA: Diagnosis not present

## 2017-12-17 LAB — HM DIABETES EYE EXAM

## 2017-12-21 DIAGNOSIS — Z7901 Long term (current) use of anticoagulants: Secondary | ICD-10-CM | POA: Diagnosis not present

## 2017-12-21 DIAGNOSIS — Z952 Presence of prosthetic heart valve: Secondary | ICD-10-CM | POA: Diagnosis not present

## 2017-12-21 DIAGNOSIS — I4892 Unspecified atrial flutter: Secondary | ICD-10-CM | POA: Diagnosis not present

## 2017-12-28 DIAGNOSIS — Z7901 Long term (current) use of anticoagulants: Secondary | ICD-10-CM | POA: Diagnosis not present

## 2017-12-28 DIAGNOSIS — Z952 Presence of prosthetic heart valve: Secondary | ICD-10-CM | POA: Diagnosis not present

## 2017-12-28 DIAGNOSIS — I4892 Unspecified atrial flutter: Secondary | ICD-10-CM | POA: Diagnosis not present

## 2018-01-04 DIAGNOSIS — Z952 Presence of prosthetic heart valve: Secondary | ICD-10-CM | POA: Diagnosis not present

## 2018-01-04 DIAGNOSIS — Z7901 Long term (current) use of anticoagulants: Secondary | ICD-10-CM | POA: Diagnosis not present

## 2018-01-11 DIAGNOSIS — Z7901 Long term (current) use of anticoagulants: Secondary | ICD-10-CM | POA: Diagnosis not present

## 2018-01-11 DIAGNOSIS — I4892 Unspecified atrial flutter: Secondary | ICD-10-CM | POA: Diagnosis not present

## 2018-01-11 DIAGNOSIS — Z952 Presence of prosthetic heart valve: Secondary | ICD-10-CM | POA: Diagnosis not present

## 2018-01-18 DIAGNOSIS — I4892 Unspecified atrial flutter: Secondary | ICD-10-CM | POA: Diagnosis not present

## 2018-01-18 DIAGNOSIS — Z952 Presence of prosthetic heart valve: Secondary | ICD-10-CM | POA: Diagnosis not present

## 2018-01-18 DIAGNOSIS — Z7901 Long term (current) use of anticoagulants: Secondary | ICD-10-CM | POA: Diagnosis not present

## 2018-01-25 DIAGNOSIS — Z952 Presence of prosthetic heart valve: Secondary | ICD-10-CM | POA: Diagnosis not present

## 2018-01-25 DIAGNOSIS — I4892 Unspecified atrial flutter: Secondary | ICD-10-CM | POA: Diagnosis not present

## 2018-01-25 DIAGNOSIS — Z7901 Long term (current) use of anticoagulants: Secondary | ICD-10-CM | POA: Diagnosis not present

## 2018-02-01 DIAGNOSIS — I4892 Unspecified atrial flutter: Secondary | ICD-10-CM | POA: Diagnosis not present

## 2018-02-01 DIAGNOSIS — Z952 Presence of prosthetic heart valve: Secondary | ICD-10-CM | POA: Diagnosis not present

## 2018-02-01 DIAGNOSIS — Z7901 Long term (current) use of anticoagulants: Secondary | ICD-10-CM | POA: Diagnosis not present

## 2018-02-08 DIAGNOSIS — Z7901 Long term (current) use of anticoagulants: Secondary | ICD-10-CM | POA: Diagnosis not present

## 2018-02-08 DIAGNOSIS — Z952 Presence of prosthetic heart valve: Secondary | ICD-10-CM | POA: Diagnosis not present

## 2018-02-08 DIAGNOSIS — I4892 Unspecified atrial flutter: Secondary | ICD-10-CM | POA: Diagnosis not present

## 2018-02-09 DIAGNOSIS — Z952 Presence of prosthetic heart valve: Secondary | ICD-10-CM | POA: Diagnosis not present

## 2018-02-15 DIAGNOSIS — Z952 Presence of prosthetic heart valve: Secondary | ICD-10-CM | POA: Diagnosis not present

## 2018-02-15 DIAGNOSIS — Z7901 Long term (current) use of anticoagulants: Secondary | ICD-10-CM | POA: Diagnosis not present

## 2018-02-15 DIAGNOSIS — I4892 Unspecified atrial flutter: Secondary | ICD-10-CM | POA: Diagnosis not present

## 2018-02-17 ENCOUNTER — Encounter: Payer: Self-pay | Admitting: Family Medicine

## 2018-02-22 DIAGNOSIS — Z952 Presence of prosthetic heart valve: Secondary | ICD-10-CM | POA: Diagnosis not present

## 2018-02-22 DIAGNOSIS — I4892 Unspecified atrial flutter: Secondary | ICD-10-CM | POA: Diagnosis not present

## 2018-02-22 DIAGNOSIS — Z7901 Long term (current) use of anticoagulants: Secondary | ICD-10-CM | POA: Diagnosis not present

## 2018-03-01 DIAGNOSIS — Z7901 Long term (current) use of anticoagulants: Secondary | ICD-10-CM | POA: Diagnosis not present

## 2018-03-01 DIAGNOSIS — I4892 Unspecified atrial flutter: Secondary | ICD-10-CM | POA: Diagnosis not present

## 2018-03-01 DIAGNOSIS — Z952 Presence of prosthetic heart valve: Secondary | ICD-10-CM | POA: Diagnosis not present

## 2018-03-08 DIAGNOSIS — Z7901 Long term (current) use of anticoagulants: Secondary | ICD-10-CM | POA: Diagnosis not present

## 2018-03-08 DIAGNOSIS — I4892 Unspecified atrial flutter: Secondary | ICD-10-CM | POA: Diagnosis not present

## 2018-03-08 DIAGNOSIS — Z952 Presence of prosthetic heart valve: Secondary | ICD-10-CM | POA: Diagnosis not present

## 2018-03-15 DIAGNOSIS — Z952 Presence of prosthetic heart valve: Secondary | ICD-10-CM | POA: Diagnosis not present

## 2018-03-15 DIAGNOSIS — I4892 Unspecified atrial flutter: Secondary | ICD-10-CM | POA: Diagnosis not present

## 2018-03-15 DIAGNOSIS — Z7901 Long term (current) use of anticoagulants: Secondary | ICD-10-CM | POA: Diagnosis not present

## 2018-08-14 ENCOUNTER — Other Ambulatory Visit: Payer: Self-pay | Admitting: Family Medicine

## 2018-09-01 ENCOUNTER — Telehealth: Payer: Self-pay

## 2018-09-01 DIAGNOSIS — E785 Hyperlipidemia, unspecified: Secondary | ICD-10-CM

## 2018-09-01 DIAGNOSIS — Z0000 Encounter for general adult medical examination without abnormal findings: Secondary | ICD-10-CM

## 2018-09-01 DIAGNOSIS — Z1329 Encounter for screening for other suspected endocrine disorder: Secondary | ICD-10-CM

## 2018-09-01 DIAGNOSIS — Z7901 Long term (current) use of anticoagulants: Secondary | ICD-10-CM

## 2018-09-01 DIAGNOSIS — I10 Essential (primary) hypertension: Secondary | ICD-10-CM

## 2018-09-01 DIAGNOSIS — R7301 Impaired fasting glucose: Secondary | ICD-10-CM

## 2018-09-01 NOTE — Telephone Encounter (Signed)
Patient has appt next week, requesting blood work prior to [employer], please review

## 2018-09-02 NOTE — Telephone Encounter (Signed)
Labs printed.  She can go at her convenience, fasting.

## 2018-09-02 NOTE — Telephone Encounter (Signed)
Patient has been advised. Rhonda Cunningham,CMA  

## 2018-09-05 ENCOUNTER — Other Ambulatory Visit: Payer: Self-pay

## 2018-09-05 DIAGNOSIS — Z7901 Long term (current) use of anticoagulants: Secondary | ICD-10-CM

## 2018-09-05 DIAGNOSIS — E785 Hyperlipidemia, unspecified: Secondary | ICD-10-CM

## 2018-09-05 DIAGNOSIS — Z1329 Encounter for screening for other suspected endocrine disorder: Secondary | ICD-10-CM

## 2018-09-05 DIAGNOSIS — I10 Essential (primary) hypertension: Secondary | ICD-10-CM

## 2018-09-05 DIAGNOSIS — Z0000 Encounter for general adult medical examination without abnormal findings: Secondary | ICD-10-CM

## 2018-09-05 DIAGNOSIS — R7301 Impaired fasting glucose: Secondary | ICD-10-CM

## 2018-09-06 ENCOUNTER — Encounter: Payer: Self-pay | Admitting: Family Medicine

## 2018-09-10 LAB — HEMOGLOBIN A1C
Hgb A1c MFr Bld: 6.5 % of total Hgb — ABNORMAL HIGH (ref <5.7)
Mean Plasma Glucose: 140 (calc)
eAG (mmol/L): 7.7 (calc)

## 2018-09-10 LAB — COMPLETE METABOLIC PANEL WITH GFR
AG Ratio: 1.5 (calc) (ref 1.0–2.5)
ALT: 37 U/L — ABNORMAL HIGH (ref 6–29)
AST: 31 U/L (ref 10–35)
Albumin: 4.1 g/dL (ref 3.6–5.1)
Alkaline phosphatase (APISO): 54 U/L (ref 37–153)
BUN/Creatinine Ratio: 32 (calc) — ABNORMAL HIGH (ref 6–22)
BUN: 26 mg/dL — ABNORMAL HIGH (ref 7–25)
CO2: 29 mmol/L (ref 20–32)
Calcium: 9.1 mg/dL (ref 8.6–10.4)
Chloride: 105 mmol/L (ref 98–110)
Creat: 0.82 mg/dL (ref 0.50–0.99)
GFR, Est African American: 89 mL/min/{1.73_m2} (ref >=60)
GFR, Est Non African American: 77 mL/min/{1.73_m2} (ref >=60)
Globulin: 2.7 g/dL (calc) (ref 1.9–3.7)
Glucose, Bld: 156 mg/dL — ABNORMAL HIGH (ref 65–99)
Potassium: 4.2 mmol/L (ref 3.5–5.3)
Sodium: 140 mmol/L (ref 135–146)
Total Bilirubin: 0.6 mg/dL (ref 0.2–1.2)
Total Protein: 6.8 g/dL (ref 6.1–8.1)

## 2018-09-10 LAB — CBC WITH DIFFERENTIAL/PLATELET
Absolute Monocytes: 551 cells/uL (ref 200–950)
Basophils Absolute: 73 cells/uL (ref 0–200)
Basophils Relative: 0.9 %
Eosinophils Absolute: 1061 cells/uL — ABNORMAL HIGH (ref 15–500)
Eosinophils Relative: 13.1 %
HCT: 42.6 % (ref 35.0–45.0)
Hemoglobin: 14 g/dL (ref 11.7–15.5)
Lymphs Abs: 2057 cells/uL (ref 850–3900)
MCH: 29.7 pg (ref 27.0–33.0)
MCHC: 32.9 g/dL (ref 32.0–36.0)
MCV: 90.3 fL (ref 80.0–100.0)
MPV: 11.5 fL (ref 7.5–12.5)
Monocytes Relative: 6.8 %
Neutro Abs: 4358 cells/uL (ref 1500–7800)
Neutrophils Relative %: 53.8 %
Platelets: 266 10*3/uL (ref 140–400)
RBC: 4.72 10*6/uL (ref 3.80–5.10)
RDW: 13.8 % (ref 11.0–15.0)
Total Lymphocyte: 25.4 %
WBC: 8.1 10*3/uL (ref 3.8–10.8)

## 2018-09-10 LAB — LIPID PANEL W/REFLEX DIRECT LDL
Cholesterol: 140 mg/dL (ref <200)
HDL: 43 mg/dL — ABNORMAL LOW (ref >=50)
LDL Cholesterol (Calc): 76 mg/dL (calc)
Non-HDL Cholesterol (Calc): 97 mg/dL (calc) (ref <130)
Total CHOL/HDL Ratio: 3.3 (calc) (ref <5.0)
Triglycerides: 125 mg/dL (ref <150)

## 2018-09-10 LAB — TSH: TSH: 3.58 mIU/L (ref 0.40–4.50)

## 2018-09-13 ENCOUNTER — Encounter: Payer: Self-pay | Admitting: Family Medicine

## 2018-09-13 ENCOUNTER — Ambulatory Visit (INDEPENDENT_AMBULATORY_CARE_PROVIDER_SITE_OTHER): Payer: BLUE CROSS/BLUE SHIELD | Admitting: Family Medicine

## 2018-09-13 ENCOUNTER — Other Ambulatory Visit: Payer: Self-pay

## 2018-09-13 VITALS — BP 138/78 | HR 82 | Ht 62.0 in | Wt 150.0 lb

## 2018-09-13 DIAGNOSIS — Z0000 Encounter for general adult medical examination without abnormal findings: Secondary | ICD-10-CM

## 2018-09-13 DIAGNOSIS — E785 Hyperlipidemia, unspecified: Secondary | ICD-10-CM

## 2018-09-13 MED ORDER — ROSUVASTATIN CALCIUM 10 MG PO TABS: 15.0000 mg | ORAL_TABLET | Freq: Every day | ORAL | 3 refills | Status: DC

## 2018-09-13 NOTE — Progress Notes (Signed)
Subjective:     Erin Hill is a 63 y.o. female and is here for a comprehensive physical exam. The patient reports no problems.  She is doing well though she knows she has gained some weight and she is not currently exercising.  Social History   Socioeconomic History  . Marital status: Married    Spouse name: Theron Aristaeter  . Number of children: Not on file  . Years of education: Not on file  . Highest education level: Not on file  Occupational History  . Occupation: PrintmakerAUTHORIZATIONS    Employer: [employer]  Social Needs  . Financial resource strain: Not on file  . Food insecurity    Worry: Not on file    Inability: Not on file  . Transportation needs    Medical: Not on file    Non-medical: Not on file  Tobacco Use  . Smoking status: Never Smoker  . Smokeless tobacco: Never Used  Substance and Sexual Activity  . Alcohol use: No  . Drug use: No  . Sexual activity: Yes    Partners: Male  Lifestyle  . Physical activity    Days per week: Not on file    Minutes per session: Not on file  . Stress: Not on file  Relationships  . Social Musicianconnections    Talks on phone: Not on file    Gets together: Not on file    Attends religious service: Not on file    Active member of club or organization: Not on file    Attends meetings of clubs or organizations: Not on file    Relationship status: Not on file  . Intimate partner violence    Fear of current or ex partner: Not on file    Emotionally abused: Not on file    Physically abused: Not on file    Forced sexual activity: Not on file  Other Topics Concern  . Not on file  Social History Narrative   No regular exercise.     Health Maintenance  Topic Date Due  . FOOT EXAM  08/16/1966  . MAMMOGRAM  07/08/2016  . COLONOSCOPY  11/14/2016  . INFLUENZA VACCINE  09/17/2018  . OPHTHALMOLOGY EXAM  12/18/2018  . HEMOGLOBIN A1C  03/12/2019  . PAP SMEAR-Modifier  06/11/2019  . TETANUS/TDAP  07/11/2025  . PNEUMOCOCCAL POLYSACCHARIDE  VACCINE AGE 84-64 HIGH RISK  Completed  . Hepatitis C Screening  Completed  . HIV Screening  Completed    The following portions of the patient's history were reviewed and updated as appropriate: allergies, current medications, past family history, past medical history, past social history, past surgical history and problem list.  Review of Systems A comprehensive review of systems was negative.   Objective:    BP 138/78   Pulse 82   Ht 5\' 2"  (1.575 m)   Wt 150 lb (68 kg)   SpO2 99%   BMI 27.44 kg/m  General appearance: alert, cooperative and appears stated age Head: Normocephalic, without obvious abnormality, atraumatic Eyes: conj clear, EOMi, PEERLA Ears: normal TM's and external ear canals both ears Nose: Nares normal. Septum midline. Mucosa normal. No drainage or sinus tenderness. Throat: lips, mucosa, and tongue normal; teeth and gums normal Neck: no adenopathy, no carotid bruit, no JVD, supple, symmetrical, trachea midline and thyroid not enlarged, symmetric, no tenderness/mass/nodules Back: symmetric, no curvature. ROM normal. No CVA tenderness. Lungs: clear to auscultation bilaterally Breasts: normal appearance, no masses or tenderness Heart: Regular rate and rhythm with loud systolic  murmur that radiates to the right carotid. Abdomen: soft, non-tender; bowel sounds normal; no masses,  no organomegaly Extremities: extremities normal, atraumatic, no cyanosis or edema Pulses: 2+ and symmetric Skin: Skin color, texture, turgor normal. No rashes or lesions Lymph nodes: Cervical, supraclavicular, and axillary nodes normal. Neurologic: Alert and oriented X 3, normal strength and tone. Normal symmetric reflexes. Normal coordination and gait    Assessment:    Healthy female exam.      Plan:     See After Visit Summary for Counseling Recommendations   complete physical examination\ Keep up a regular exercise program and make sure you are eating a healthy diet Try to eat 4  servings of dairy a day, or if you are lactose intolerant take a calcium with vitamin D daily.  Your vaccines are up to date.   Medication side effect-she complains of feeling like her legs are extremely heavy on the atorvastatin.  She had taken it before and has been getting the same symptoms.  She would really like to go back on the Crestor but unfortunately her previous insurance would not cover it.  She has a new Buyer, retail and is hopeful that they will cover the Crestor again.  New prescription sent to pharmacy.

## 2018-09-13 NOTE — Patient Instructions (Signed)
Health Maintenance, Female Adopting a healthy lifestyle and getting preventive care are important in promoting health and wellness. Ask your health care provider about:  The right schedule for you to have regular tests and exams.  Things you can do on your own to prevent diseases and keep yourself healthy. What should I know about diet, weight, and exercise? Eat a healthy diet   Eat a diet that includes plenty of vegetables, fruits, low-fat dairy products, and lean protein.  Do not eat a lot of foods that are high in solid fats, added sugars, or sodium. Maintain a healthy weight Body mass index (BMI) is used to identify weight problems. It estimates body fat based on height and weight. Your health care provider can help determine your BMI and help you achieve or maintain a healthy weight. Get regular exercise Get regular exercise. This is one of the most important things you can do for your health. Most adults should:  Exercise for at least 150 minutes each week. The exercise should increase your heart rate and make you sweat (moderate-intensity exercise).  Do strengthening exercises at least twice a week. This is in addition to the moderate-intensity exercise.  Spend less time sitting. Even light physical activity can be beneficial. Watch cholesterol and blood lipids Have your blood tested for lipids and cholesterol at 62 years of age, then have this test every 5 years. Have your cholesterol levels checked more often if:  Your lipid or cholesterol levels are high.  You are older than 62 years of age.  You are at high risk for heart disease. What should I know about cancer screening? Depending on your health history and family history, you may need to have cancer screening at various ages. This may include screening for:  Breast cancer.  Cervical cancer.  Colorectal cancer.  Skin cancer.  Lung cancer. What should I know about heart disease, diabetes, and high blood  pressure? Blood pressure and heart disease  High blood pressure causes heart disease and increases the risk of stroke. This is more likely to develop in people who have high blood pressure readings, are of African descent, or are overweight.  Have your blood pressure checked: ? Every 3-5 years if you are 18-39 years of age. ? Every year if you are 40 years old or older. Diabetes Have regular diabetes screenings. This checks your fasting blood sugar level. Have the screening done:  Once every three years after age 40 if you are at a normal weight and have a low risk for diabetes.  More often and at a younger age if you are overweight or have a high risk for diabetes. What should I know about preventing infection? Hepatitis B If you have a higher risk for hepatitis B, you should be screened for this virus. Talk with your health care provider to find out if you are at risk for hepatitis B infection. Hepatitis C Testing is recommended for:  Everyone born from 1945 through 1965.  Anyone with known risk factors for hepatitis C. Sexually transmitted infections (STIs)  Get screened for STIs, including gonorrhea and chlamydia, if: ? You are sexually active and are younger than 62 years of age. ? You are older than 62 years of age and your health care provider tells you that you are at risk for this type of infection. ? Your sexual activity has changed since you were last screened, and you are at increased risk for chlamydia or gonorrhea. Ask your health care provider if   you are at risk.  Ask your health care provider about whether you are at high risk for HIV. Your health care provider may recommend a prescription medicine to help prevent HIV infection. If you choose to take medicine to prevent HIV, you should first get tested for HIV. You should then be tested every 3 months for as long as you are taking the medicine. Pregnancy  If you are about to stop having your period (premenopausal) and  you may become pregnant, seek counseling before you get pregnant.  Take 400 to 800 micrograms (mcg) of folic acid every day if you become pregnant.  Ask for birth control (contraception) if you want to prevent pregnancy. Osteoporosis and menopause Osteoporosis is a disease in which the bones lose minerals and strength with aging. This can result in bone fractures. If you are 65 years old or older, or if you are at risk for osteoporosis and fractures, ask your health care provider if you should:  Be screened for bone loss.  Take a calcium or vitamin D supplement to lower your risk of fractures.  Be given hormone replacement therapy (HRT) to treat symptoms of menopause. Follow these instructions at home: Lifestyle  Do not use any products that contain nicotine or tobacco, such as cigarettes, e-cigarettes, and chewing tobacco. If you need help quitting, ask your health care provider.  Do not use street drugs.  Do not share needles.  Ask your health care provider for help if you need support or information about quitting drugs. Alcohol use  Do not drink alcohol if: ? Your health care provider tells you not to drink. ? You are pregnant, may be pregnant, or are planning to become pregnant.  If you drink alcohol: ? Limit how much you use to 0-1 drink a day. ? Limit intake if you are breastfeeding.  Be aware of how much alcohol is in your drink. In the [place] one drink equals one 12 oz bottle of beer (355 mL), one 5 oz glass of wine (148 mL), or one 1 oz glass of hard liquor (44 mL). General instructions  Schedule regular health, dental, and eye exams.  Stay current with your vaccines.  Tell your health care provider if: ? You often feel depressed. ? You have ever been abused or do not feel safe at home. Summary  Adopting a healthy lifestyle and getting preventive care are important in promoting health and wellness.  Follow your health care provider's instructions about healthy  diet, exercising, and getting tested or screened for diseases.  Follow your health care provider's instructions on monitoring your cholesterol and blood pressure. This information is not intended to replace advice given to you by your health care provider. Make sure you discuss any questions you have with your health care provider. Document Released: 08/18/2010 Document Revised: 01/26/2018 Document Reviewed: 01/26/2018 Elsevier Patient Education  2020 [employer]  

## 2018-10-07 ENCOUNTER — Ambulatory Visit (INDEPENDENT_AMBULATORY_CARE_PROVIDER_SITE_OTHER): Payer: BLUE CROSS/BLUE SHIELD | Admitting: Physician Assistant

## 2018-10-07 ENCOUNTER — Other Ambulatory Visit: Payer: Self-pay

## 2018-10-07 ENCOUNTER — Encounter: Payer: Self-pay | Admitting: Physician Assistant

## 2018-10-07 VITALS — BP 144/44 | HR 74 | Temp 98.2°F | Ht 62.0 in | Wt 150.0 lb

## 2018-10-07 DIAGNOSIS — N644 Mastodynia: Secondary | ICD-10-CM

## 2018-10-07 DIAGNOSIS — M79622 Pain in left upper arm: Secondary | ICD-10-CM

## 2018-10-07 DIAGNOSIS — M549 Dorsalgia, unspecified: Secondary | ICD-10-CM | POA: Diagnosis not present

## 2018-10-07 MED ORDER — LIDOCAINE 5 % EX OINT: 1.0000 "application " | TOPICAL_OINTMENT | Freq: Three times a day (TID) | CUTANEOUS | 0 refills | Status: DC | PRN

## 2018-10-07 MED ORDER — VALACYCLOVIR HCL 1 G PO TABS: 1000.0000 mg | ORAL_TABLET | Freq: Three times a day (TID) | ORAL | 0 refills | Status: DC

## 2018-10-07 NOTE — Patient Instructions (Addendum)
Tylenol 1000mg  every 4-6 hours up to 4000mg  a day.   Start valtrex.   Cool compresses.   Use lidocaine gel if needed.   Shingles  Shingles is an infection. It gives you a painful skin rash and blisters that have fluid in them. Shingles is caused by the same germ (virus) that causes chickenpox. Shingles only happens in people who:  Have had chickenpox.  Have been given a shot of medicine (vaccine) to protect against chickenpox. Shingles is rare in this group. The first symptoms of shingles may be itching, tingling, or pain in an area on your skin. A rash will show on your skin a few days or weeks later. The rash is likely to be on one side of your body. The rash usually has a shape like a belt or a band. Over time, the rash turns into fluid-filled blisters. The blisters will break open, change into scabs, and dry up. Medicines may:  Help with pain and itching.  Help you get better sooner.  Help to prevent long-term problems. Follow these instructions at home: Medicines  Take over-the-counter and prescription medicines only as told by your doctor.  Put on an anti-itch cream or numbing cream where you have a rash, blisters, or scabs. Do this as told by your doctor. Helping with itching and discomfort   Put cold, wet cloths (cold compresses) on the area of the rash or blisters as told by your doctor.  Cool baths can help you feel better. Try adding baking soda or dry oatmeal to the water to lessen itching. Do not bathe in hot water. Blister and rash care  Keep your rash covered with a loose bandage (dressing).  Wear loose clothing that does not rub on your rash.  Keep your rash and blisters clean. To do this, wash the area with mild soap and cool water as told by your doctor.  Check your rash every day for signs of infection. Check for: ? More redness, swelling, or pain. ? Fluid or blood. ? Warmth. ? Pus or a bad smell.  Do not scratch your rash. Do not pick at your  blisters. To help you to not scratch: ? Keep your fingernails clean and cut short. ? Wear gloves or mittens when you sleep, if scratching is a problem. General instructions  Rest as told by your doctor.  Keep all follow-up visits as told by your doctor. This is important.  Wash your hands often with soap and water. If soap and water are not available, use hand sanitizer. Doing this lowers your chance of getting a skin infection caused by germs (bacteria).  Your infection can cause chickenpox in people who have never had chickenpox or never got a shot of chickenpox vaccine. If you have blisters that did not change into scabs yet, try not to touch other people or be around other people, especially: ? Babies. ? Pregnant women. ? Children who have areas of red, itchy, or rough skin (eczema). ? Very old people who have transplants. ? People who have a long-term (chronic) sickness, like cancer or AIDS. Contact a doctor if:  Your pain does not get better with medicine.  Your pain does not get better after the rash heals.  You have any signs of infection in the rash area. These signs include: ? More redness, swelling, or pain around the rash. ? Fluid or blood coming from the rash. ? The rash area feeling warm to the touch. ? Pus or a bad smell coming  from the rash. Get help right away if:  The rash is on your face or nose.  You have pain in your face or pain by your eye.  You lose feeling on one side of your face.  You have trouble seeing.  You have ear pain, or you have ringing in your ear.  You have a loss of taste.  Your condition gets worse. Summary  Shingles gives you a painful skin rash and blisters that have fluid in them.  Shingles is an infection. It is caused by the same germ (virus) that causes chickenpox.  Keep your rash covered with a loose bandage (dressing). Wear loose clothing that does not rub on your rash.  If you have blisters that did not change into  scabs yet, try not to touch other people or be around people. This information is not intended to replace advice given to you by your health care provider. Make sure you discuss any questions you have with your health care provider. Document Released: 07/22/2007 Document Revised: 05/27/2018 Document Reviewed: 10/07/2016 Elsevier Patient Education  2020 [employer].

## 2018-10-07 NOTE — Progress Notes (Signed)
Subjective:    Patient ID: Erin Hill, female    DOB: 1955-11-03, 63 y.o.   MRN: [id number]  HPI  Pt is a 63 yo female who presents to the clinic with pain around left nipple that extends into left axilla and left mid back. She noticed the left nipple pain around Friday or Saturday and slowly worsening. She did not even wear a bra today because it hurt so bad. She describes the pain as "raw". She has not done anything to make better. she denies any heavy lifting or recent new exercises. She has not had shingles vaccine. She denies any recent illness. She denies any fever, chills, body aches. She has been stressed more than normal. No recent mammogram.   .. Active Ambulatory Problems    Diagnosis Date Noted  . IFG (impaired fasting glucose) 11/24/2005  . Hyperlipidemia 11/24/2005  . Essential hypertension 11/24/2005  . MITRAL VALVE REPLACEMENT, HX OF 08/05/2006  . Aortic valve replaced 08/05/2006  . Trigger finger, acquired 08/02/2012  . Pulmonary hypertension (HCC) 03/09/2013  . Metacarpophalangeal joint pain of right hand 06/05/2013  . PVC's (premature ventricular contractions) 06/05/2013  . Diastolic dysfunction 06/12/2015  . Allergy to IVP dye 08/27/2011  . Atrial flutter with rapid ventricular response (HCC) 02/23/2013  . Long term current use of anticoagulant 08/27/2011  . Normal coronary arteries 08/27/2011  . Rheumatic heart disease 08/27/2011  . New onset type 2 diabetes mellitus (HCC) 07/21/2017  . Chest pain 07/20/2017   Resolved Ambulatory Problems    Diagnosis Date Noted  . Glucose found in urine on examination 05/21/2014  . Pre-diabetes 05/22/2014   Past Medical History:  Diagnosis Date  . Atrial flutter (HCC)   . Hearing loss   . Miscarriage   . Proteinuria        Review of Systems  All other systems reviewed and are negative.      Objective:   Physical Exam Vitals signs reviewed.  Constitutional:      Appearance: Normal appearance.   Cardiovascular:     Rate and Rhythm: Normal rate and regular rhythm.     Pulses: Normal pulses.  Pulmonary:     Effort: Pulmonary effort is normal.     Breath sounds: Normal breath sounds.  Chest:    Skin:    Comments: No rash.   Neurological:     General: No focal deficit present.     Mental Status: She is alert and oriented to person, place, and time.  Psychiatric:        Mood and Affect: Mood normal.        Behavior: Behavior normal.           Assessment & Plan:  .Marland Kitchen.Doree FudgeLuz was seen today for pain.  Diagnoses and all orders for this visit:  Breast pain, left -     valACYclovir (VALTREX) 1000 MG tablet; Take 1 tablet (1,000 mg total) by mouth 3 (three) times daily. For 7 days. -     lidocaine (XYLOCAINE) 5 % ointment; Apply 1 application topically 3 (three) times daily as needed. -     MM DIAG BREAST TOMO BILATERAL  Left axillary pain -     valACYclovir (VALTREX) 1000 MG tablet; Take 1 tablet (1,000 mg total) by mouth 3 (three) times daily. For 7 days. -     lidocaine (XYLOCAINE) 5 % ointment; Apply 1 application topically 3 (three) times daily as needed. -     MM DIAG BREAST TOMO BILATERAL  Upper  back pain on left side -     valACYclovir (VALTREX) 1000 MG tablet; Take 1 tablet (1,000 mg total) by mouth 3 (three) times daily. For 7 days. -     lidocaine (XYLOCAINE) 5 % ointment; Apply 1 application topically 3 (three) times daily as needed.   Discussed with patient symptoms seem consistent with shingles. Currently there is no rash will treat with antiviral/lidocaine/tylenol. Pt cannot take NSAIDS with coumadin. Encouraged cool compresses. No masses palpated today but needs mammogram. Ordered diagnostic. Follow up with PCP as needed.

## 2018-10-10 ENCOUNTER — Other Ambulatory Visit: Payer: Self-pay | Admitting: Physician Assistant

## 2018-10-10 DIAGNOSIS — N644 Mastodynia: Secondary | ICD-10-CM

## 2018-10-10 DIAGNOSIS — M79622 Pain in left upper arm: Secondary | ICD-10-CM

## 2018-10-17 ENCOUNTER — Telehealth: Payer: Self-pay

## 2018-10-17 NOTE — Telephone Encounter (Signed)
Erin Hill called and left a message stating she needs to change the location of the diagnostic mammogram. I called her back and left a message for a return call.

## 2018-10-17 NOTE — Telephone Encounter (Signed)
Patient has been scheduled for September 9th arriving at 8:45 am [facility]. Order faxed. Patient is aware.    [facility] Phone [phone] Fax [phone]

## 2018-10-25 LAB — HM COLONOSCOPY

## 2018-10-31 ENCOUNTER — Ambulatory Visit: Payer: Self-pay

## 2018-11-01 LAB — HM MAMMOGRAPHY

## 2018-11-08 ENCOUNTER — Ambulatory Visit (INDEPENDENT_AMBULATORY_CARE_PROVIDER_SITE_OTHER): Payer: BLUE CROSS/BLUE SHIELD | Admitting: Family Medicine

## 2018-11-08 DIAGNOSIS — Z23 Encounter for immunization: Secondary | ICD-10-CM | POA: Diagnosis not present

## 2018-11-18 ENCOUNTER — Other Ambulatory Visit: Payer: Self-pay | Admitting: Family Medicine

## 2018-12-13 ENCOUNTER — Encounter: Payer: Self-pay | Admitting: Family Medicine

## 2018-12-13 ENCOUNTER — Ambulatory Visit (INDEPENDENT_AMBULATORY_CARE_PROVIDER_SITE_OTHER): Payer: BLUE CROSS/BLUE SHIELD | Admitting: Family Medicine

## 2018-12-13 ENCOUNTER — Other Ambulatory Visit: Payer: Self-pay

## 2018-12-13 VITALS — BP 153/47 | HR 66 | Ht 62.0 in | Wt 152.0 lb

## 2018-12-13 DIAGNOSIS — R7301 Impaired fasting glucose: Secondary | ICD-10-CM | POA: Diagnosis not present

## 2018-12-13 DIAGNOSIS — M79644 Pain in right finger(s): Secondary | ICD-10-CM

## 2018-12-13 DIAGNOSIS — E119 Type 2 diabetes mellitus without complications: Secondary | ICD-10-CM | POA: Diagnosis not present

## 2018-12-13 DIAGNOSIS — I10 Essential (primary) hypertension: Secondary | ICD-10-CM | POA: Diagnosis not present

## 2018-12-13 LAB — POCT GLYCOSYLATED HEMOGLOBIN (HGB A1C): Hemoglobin A1C: 5.6 % (ref 4.0–5.6)

## 2018-12-13 NOTE — Patient Instructions (Signed)
Pt told to check her bp 2x daily and bring in her logs and f/u in 2 wks for a bp check with the nurse.

## 2018-12-13 NOTE — Progress Notes (Signed)
Established Patient Office Visit  Subjective:  Patient ID: Erin Hill, female    DOB: March 13, 1955  Age: 63 y.o. MRN: [id number]  CC:  Chief Complaint  Patient presents with  . Diabetes    HPI MRY LAMIA presents for   Diabetes - no hypoglycemic events. No wounds or sores that are not healing well. No increased thirst or urination. Checking glucose at home. Taking medications as prescribed without any side effects.  She has been doing intermittent fasting and says that she actually feels really good on it.  Hypertension- Pt denies chest pain, SOB, dizziness, or heart palpitations.  Taking meds as directed w/o problems.  Denies medication side effects.    She also c/o finger pain in her DIP on her right hand.   She says has ben swollen for awhile. No  Injury or trauma. She has been using a topical menthol product called Blue.    Past Medical History:  Diagnosis Date  . Atrial flutter ([place]    2015  . Hearing loss    chronic  . Miscarriage    X 2  . Proteinuria    chronic (not related to DM)  . Rheumatic heart disease    s/p aortic/ mitral valve replace    Past Surgical History:  Procedure Laterality Date  . double heart valve replacement  1999  . echo EF 75-80%    . SAB     X 2  . stapes surgery, left  2003   then removed 2 years later  . TAB      X 1  . TONSILLECTOMY  as a child    Family History  Problem Relation Age of Onset  . Diabetes Father   . Lung cancer Mother        smoked until her 39's  . Hypertension Mother   . Diabetes Father   . Hyperlipidemia Father   . Hypertension Father     Social History   Socioeconomic History  . Marital status: Married    Spouse name: Collier Salina  . Number of children: Not on file  . Years of education: Not on file  . Highest education level: Not on file  Occupational History  . Occupation: Retail banker: [employer]  . Financial resource strain: Not on file  . Food insecurity    Worry: Not on file    Inability: Not on file  . Transportation needs    Medical: Not on file    Non-medical: Not on file  Tobacco Use  . Smoking status: Never Smoker  . Smokeless tobacco: Never Used  Substance and Sexual Activity  . Alcohol use: No  . Drug use: No  . Sexual activity: Yes    Partners: Male  Lifestyle  . Physical activity    Days per week: Not on file    Minutes per session: Not on file  . Stress: Not on file  Relationships  . Social Herbalist on phone: Not on file    Gets together: Not on file    Attends religious service: Not on file    Active member of club or organization: Not on file    Attends meetings of clubs or organizations: Not on file    Relationship status: Not on file  . Intimate partner violence    Fear of current or ex partner: Not on file    Emotionally abused: Not on file    Physically  abused: Not on file    Forced sexual activity: Not on file  Other Topics Concern  . Not on file  Social History Narrative   No regular exercise.      Outpatient Medications Prior to Visit  Medication Sig Dispense Refill  . b complex vitamins tablet Take 1 tablet by mouth daily.    . Blood Glucose Monitoring Suppl (ACCU-CHEK AVIVA PLUS) w/Device KIT Use to check blood sugar twice daily. Dx: R73.01 1 kit PRN  . Coenzyme Q-10 100 MG capsule Take 300 mg by mouth daily.    Marland Kitchen glucose blood (ACCU-CHEK AVIVA PLUS) test strip Use to check blood sugar twice daily. Dx: R73.01 100 each 12  . hydrochlorothiazide (HYDRODIURIL) 25 MG tablet     . Lancets (ACCU-CHEK MULTICLIX) lancets Use to check blood sugar twice daily. Dx: R73.01 100 each 12  . Lancets Misc. (ACCU-CHEK MULTICLIX LANCET DEV) KIT Use to check blood sugar twice daily. Dx: R73.01 1 each PRN  . lidocaine (XYLOCAINE) 5 % ointment Apply 1 application topically 3 (three) times daily as needed. 50 g 0  . metoprolol succinate (TOPROL-XL) 25 MG 24 hr tablet Take 25 mg by mouth daily.    . quinapril  (ACCUPRIL) 40 MG tablet TAKE 1 TABLET BY MOUTH ONCE DAILY    . rosuvastatin (CRESTOR) 10 MG tablet Take 1.5 tablets (15 mg total) by mouth daily. TAKE 1 AND 1/2 TABLET DAILY 135 tablet 3  . Vitamin K, Phytonadione, 100 MCG TABS Take 100 mcg daily by mouth.    . warfarin (COUMADIN) 1 MG tablet 6 mg (5 mg x 1 and 1 mg x 1) every Mon, Wed, Fri; 5 mg (5 mg x 1) all other days as directed by Coumadin Clinic (take with 93m tablets daily)    . warfarin (COUMADIN) 5 MG tablet TAKE 1 TABLET (5 MG) BY MOUTH DAILY EXCEPT FOR MON/FRI TAKE 1/2 TABLET (2.5MG) OR AS DIRECTED BY COUMADIN CLINIC.    .Marland KitchenvalACYclovir (VALTREX) 1000 MG tablet Take 1 tablet (1,000 mg total) by mouth 3 (three) times daily. For 7 days. 21 tablet 0   No facility-administered medications prior to visit.     Allergies  Allergen Reactions  . Gemfibrozil Other (See Comments)    Acid reflux  . Shellfish Allergy     Other reaction(s): Other Whelps all over her body  . Erythromycin     REACTION: renal toxicity  . Lipitor [Atorvastatin] Nausea Only    Stomach pain  . Metformin And Related Other (See Comments)    Nausea, loose stools, fatigue   . Protonix [Pantoprazole Sodium] Other (See Comments)    Nause, fatigue, muscle cramping.   . Versed [Midazolam]     REACTION: rash, fever  . Contrast Media [Iodinated Diagnostic Agents] Rash    Fever  . Iodine Rash    Contrast media    ROS Review of Systems    Objective:    Physical Exam  Constitutional: She is oriented to person, place, and time. She appears well-developed and well-nourished.  HENT:  Head: Normocephalic and atraumatic.  Cardiovascular: Normal rate, regular rhythm and normal heart sounds.  Pulmonary/Chest: Effort normal and breath sounds normal.  Musculoskeletal:     Comments: DIP on the fifth digit on the right hand is mildly swollen.  Mildly tender on exam normal flexion and extension.  No other swollen joints on her hands.  Neurological: She is alert and  oriented to person, place, and time.  Skin: Skin is warm  and dry.  Psychiatric: She has a normal mood and affect. Her behavior is normal.    BP (!) 153/47   Pulse 66   Ht _0  (1.575 m)   Wt 152 lb (68.9 kg)   SpO2 100%   BMI 27.80 kg/m  Wt Readings from Last 3 Encounters:  12/13/18 152 lb (68.9 kg)  10/07/18 150 lb (68 kg)  09/13/18 150 lb (68 kg)     There are no preventive care reminders to display for this patient.  There are no preventive care reminders to display for this patient.  Lab Results  Component Value Date   TSH 3.58 09/09/2018   Lab Results  Component Value Date   WBC 8.1 09/09/2018   HGB 14.0 09/09/2018   HCT 42.6 09/09/2018   MCV 90.3 09/09/2018   PLT 266 09/09/2018   Lab Results  Component Value Date   NA 140 09/09/2018   K 4.2 09/09/2018   CO2 29 09/09/2018   GLUCOSE 156 (H) 09/09/2018   BUN 26 (H) 09/09/2018   CREATININE 0.82 09/09/2018   BILITOT 0.6 09/09/2018   ALKPHOS 59 07/12/2015   AST 31 09/09/2018   ALT 37 (H) 09/09/2018   PROT 6.8 09/09/2018   ALBUMIN 4.3 07/12/2015   CALCIUM 9.1 09/09/2018   Lab Results  Component Value Date   CHOL 140 09/09/2018   Lab Results  Component Value Date   HDL 43 (L) 09/09/2018   Lab Results  Component Value Date   LDLCALC 76 09/09/2018   Lab Results  Component Value Date   TRIG 125 09/09/2018   Lab Results  Component Value Date   CHOLHDL 3.3 09/09/2018   Lab Results  Component Value Date   HGBA1C 5.6 12/13/2018      Assessment & Plan:   Problem List Items Addressed This Visit      Cardiovascular and Mediastinum   Essential hypertension    Blood pressure not well controlled today that she reports she took her medicine right before her appointment today.  She was just seen in the last couple weeks redness and coagulation visit and her blood pressure was at goal at that point.  Just encouraged her to make sure she is taking her medication consistently.  We will continue to  monitor.        Endocrine   New onset type 2 diabetes mellitus (HCC)    A1c looks fantastic today at 5.6.  Continue current regimen she is doing phenomenally if anything watch out for lows.  Otherwise follow-up in 4 months.      Relevant Orders   POCT glycosylated hemoglobin (Hb A1C) (Completed)   RESOLVED: IFG (impaired fasting glucose) - Primary    Other Visit Diagnoses    Finger pain, right         Finger pain - pain in the DIP of the 5th digit with some welling. Likely OA. Recommend a trial of Voltaren gel. Call if getting worse.    No orders of the defined types were placed in this encounter.   Follow-up: Return in about 4 months (around 04/15/2019) for Diabetes follow-up.    Beatrice Lecher, MD

## 2018-12-13 NOTE — Assessment & Plan Note (Signed)
Blood pressure not well controlled today that she reports she took her medicine right before her appointment today.  She was just seen in the last couple weeks redness and coagulation visit and her blood pressure was at goal at that point.  Just encouraged her to make sure she is taking her medication consistently.  We will continue to monitor.

## 2018-12-13 NOTE — Assessment & Plan Note (Signed)
A1c looks fantastic today at 5.6.  Continue current regimen she is doing phenomenally if anything watch out for lows.  Otherwise follow-up in 4 months.

## 2018-12-19 ENCOUNTER — Other Ambulatory Visit: Payer: Self-pay | Admitting: Family Medicine

## 2018-12-19 DIAGNOSIS — R7301 Impaired fasting glucose: Secondary | ICD-10-CM

## 2018-12-27 ENCOUNTER — Ambulatory Visit (INDEPENDENT_AMBULATORY_CARE_PROVIDER_SITE_OTHER): Payer: BLUE CROSS/BLUE SHIELD | Admitting: Family Medicine

## 2018-12-27 ENCOUNTER — Other Ambulatory Visit: Payer: Self-pay

## 2018-12-27 VITALS — BP 138/49 | HR 65 | Ht 62.0 in | Wt 152.0 lb

## 2018-12-27 DIAGNOSIS — I10 Essential (primary) hypertension: Secondary | ICD-10-CM

## 2018-12-27 NOTE — Progress Notes (Signed)
Patient presents to clinic for a BP check 2 weeks after starting Metoprolol 25 mg.  Patient reports taking her Hydrochlorothiazide at bedtime and the metoprolol and Quinapril in the evening. Patient brought a copy of the readings and I placed a copy to scan. PCP was notified of the results.   I called the patient and she made a 2 week nurse visit follow up. I advised her that she would need to take the Hydrochlorothiazide in the morning and then take the Metoprolol and the quinapril in the evening. She will keep a log of the BP readings. I advised her if the first number is high that she would need to rest for a few minutes and then recheck in about 10 minutes to see if the reading is lower. She did not have any other questions. Patient did not need any refills that she was aware of at this time.

## 2018-12-27 NOTE — Progress Notes (Addendum)
Agree with documentation as above. See home BP log.  Beatrice Lecher, MD

## 2018-12-29 ENCOUNTER — Encounter: Payer: Self-pay | Admitting: Family Medicine

## 2019-01-05 ENCOUNTER — Telehealth: Payer: Self-pay

## 2019-01-05 NOTE — Telephone Encounter (Signed)
Erin Hill states she has noticed her heart beating really hard and not feeling well. She states this started when she changed her blood pressure medications from the morning. She was advised to take the Hydrochlorothiazide in the morning and then take the Metoprolol and the quinapril in the evening. She wants to go back to taking them all in the morning. She has a nurse visit next week.

## 2019-01-06 NOTE — Telephone Encounter (Signed)
Okay, that is fine.  We can always consider going up on the metoprolol to 50 mg as needed as well.  She is also on a low dose and this would help control her heart rate.  The other option is she could also move her quinapril to the morning and keep the metoprolol at night.  But certainly what ever she feels comfortable with this fine.

## 2019-01-06 NOTE — Telephone Encounter (Signed)
Left pt detailed msg of providers note on ID'd VM.

## 2019-01-10 ENCOUNTER — Other Ambulatory Visit: Payer: Self-pay

## 2019-01-10 ENCOUNTER — Ambulatory Visit (INDEPENDENT_AMBULATORY_CARE_PROVIDER_SITE_OTHER): Payer: BLUE CROSS/BLUE SHIELD | Admitting: Family Medicine

## 2019-01-10 VITALS — BP 139/57 | HR 67 | Ht 62.0 in | Wt 150.0 lb

## 2019-01-10 DIAGNOSIS — I10 Essential (primary) hypertension: Secondary | ICD-10-CM

## 2019-01-10 MED ORDER — METOPROLOL SUCCINATE ER 50 MG PO TB24: 50.0000 mg | ORAL_TABLET | Freq: Every day | ORAL | 1 refills | Status: DC

## 2019-01-10 NOTE — Progress Notes (Signed)
Hypertension-plan to increase metoprolol to 50 mg.  New prescription sent to pharmacy month.  Monitor blood pressures for the next 2 weeks and then drop off blood pressure log for [place] to take a look at it.  Beatrice Lecher, MD

## 2019-01-10 NOTE — Progress Notes (Signed)
Patient in the clinic for a BP check. She is taking Metoprolol and HCTZ in the am and the Qinoalopril in the evening.   Patient dropped off her blood pressure logs and I gave them to PCP. She is going to review and see if we need to adjust any of her medications.  Per patient she wants PCP to talk with Dr. Mauricio Po about any adjustments before changing any of the dosages. [phone]).   She had taken her HCTZ and Metoprolol about an hour before coming in to the appointment. Patient is aware we will call her with any changes. No other questions.

## 2019-01-10 NOTE — Progress Notes (Signed)
I called the patient and she is agreeable to the recommendations. She is agreeable to start taking the 50 mg dose. She has a few left of the 25 mg and will double up and she has already picked up the 50 mg dose. She will start taking the 50 mg dose and keep a log and drop it off. She had felt her heart racing a time or two and felt more fatigue. She said this only lasted one time but no others. She is aware to call the office if any symptoms persist. No other questions at this time.

## 2019-01-10 NOTE — Addendum Note (Signed)
Addended by: Beatrice Lecher D on: 01/10/2019 03:40 PM   Modules accepted: Orders

## 2019-01-24 ENCOUNTER — Telehealth: Payer: Self-pay | Admitting: Family Medicine

## 2019-01-24 NOTE — Telephone Encounter (Signed)
Call pt:BPs still running high, esp in AM. Continue the quinapril, hydrochlorothiazide, and will increase the metoprolol to 100mg .  So take 2 of the metoprolol and see if BPs come down over the next 2 weeks. Please drop off another log in 2 weeks to review.    See scanned BP log.

## 2019-01-25 NOTE — Telephone Encounter (Signed)
Pt advised of recommendations and repeated. No questions. She will continue to monitor and bring log into office in 2 weeks.Maryruth Eve, Lahoma Crocker, CMA

## 2019-02-02 ENCOUNTER — Other Ambulatory Visit: Payer: Self-pay | Admitting: Family Medicine

## 2019-02-27 ENCOUNTER — Other Ambulatory Visit: Payer: Self-pay | Admitting: Family Medicine

## 2019-02-28 ENCOUNTER — Encounter: Payer: Self-pay | Admitting: Family Medicine

## 2019-03-01 ENCOUNTER — Telehealth: Payer: Self-pay | Admitting: Family Medicine

## 2019-03-01 MED ORDER — METOPROLOL SUCCINATE ER 100 MG PO TB24: 100.0000 mg | ORAL_TABLET | Freq: Every day | ORAL | 1 refills | Status: DC

## 2019-03-01 NOTE — Telephone Encounter (Signed)
Hi Erin Hill,  I looked at BP levels and they are still high. Are you still taking HCTZ and the quinapril? I have an idea. We can combine the HCTZ and a similar drug to the quinapril, with a small dose of amlodipine.  It is called Tribenzor and it went generic.  It would help reduce your pill burden and might better control your BPs.    Dr. Judie Petit

## 2019-03-03 MED ORDER — OLMESARTAN-AMLODIPINE-HCTZ 40-5-25 MG PO TABS: 1.0000 | ORAL_TABLET | Freq: Every day | ORAL | 1 refills | Status: DC

## 2019-03-03 NOTE — Addendum Note (Signed)
Addended by: Nani Gasser D on: 03/03/2019 09:53 AM   Modules accepted: Orders

## 2019-03-10 ENCOUNTER — Encounter: Payer: Self-pay | Admitting: Family Medicine

## 2019-03-20 ENCOUNTER — Encounter: Payer: Self-pay | Admitting: Family Medicine

## 2019-03-25 ENCOUNTER — Other Ambulatory Visit: Payer: Self-pay | Admitting: Family Medicine

## 2019-04-09 ENCOUNTER — Encounter: Payer: Self-pay | Admitting: Family Medicine

## 2019-04-18 ENCOUNTER — Ambulatory Visit (INDEPENDENT_AMBULATORY_CARE_PROVIDER_SITE_OTHER): Payer: BC Managed Care – PPO | Admitting: Family Medicine

## 2019-04-18 ENCOUNTER — Other Ambulatory Visit: Payer: Self-pay

## 2019-04-18 ENCOUNTER — Encounter: Payer: Self-pay | Admitting: Family Medicine

## 2019-04-18 VITALS — BP 136/53 | HR 53 | Ht 62.0 in | Wt 154.0 lb

## 2019-04-18 DIAGNOSIS — I10 Essential (primary) hypertension: Secondary | ICD-10-CM | POA: Diagnosis not present

## 2019-04-18 DIAGNOSIS — M25511 Pain in right shoulder: Secondary | ICD-10-CM

## 2019-04-18 DIAGNOSIS — R040 Epistaxis: Secondary | ICD-10-CM | POA: Diagnosis not present

## 2019-04-18 DIAGNOSIS — E119 Type 2 diabetes mellitus without complications: Secondary | ICD-10-CM | POA: Diagnosis not present

## 2019-04-18 LAB — POCT GLYCOSYLATED HEMOGLOBIN (HGB A1C): Hemoglobin A1C: 6.8 % — AB (ref 4.0–5.6)

## 2019-04-18 MED ORDER — OLMESARTAN-AMLODIPINE-HCTZ 40-5-25 MG PO TABS: 1.0000 | ORAL_TABLET | Freq: Every day | ORAL | 1 refills | Status: DC

## 2019-04-18 MED ORDER — SITAGLIPTIN PHOSPHATE 25 MG PO TABS: 25.0000 mg | ORAL_TABLET | Freq: Every day | ORAL | 1 refills | Status: DC

## 2019-04-18 NOTE — Assessment & Plan Note (Signed)
Home BPs are well controlled.   

## 2019-04-18 NOTE — Assessment & Plan Note (Signed)
When seen jumped up significantly from previous.  Discussed options.  We will start either Januvia or Onglyza I will see what might be better covered with her insurance.  Follow-up in 3 months.

## 2019-04-18 NOTE — Progress Notes (Signed)
Established Patient Office Visit  Subjective:  Patient ID: Erin Hill, female    DOB: 10-Oct-1955  Age: 64 y.o. MRN: [id number]  CC:  Chief Complaint  Patient presents with  . Diabetes    BS: 113  . Hypertension    doing much better on new regimen    HPI Erin Hill presents for new dx of Diabetes.  Her A1c today unfortunately jumped up significantly at 6.8.  She admits that she has been getting into a lot more carbs than usual and has not been great with her diet.  Hypertension- Pt denies chest pain, SOB, dizziness, or heart palpitations.  Taking meds as directed w/o problems.  Denies medication side effects.  Had recently made several adjustments to her blood pressure regimen and she feels like overall that has been effective and they seem to be well controlled now.  Doing well on Tribenzor and metoprolol.  He actually feels like she has had much more energy and says that some chest pain that she was having previously actually has resolved since we started the Tribenzor.  Also let me know that she had a nosebleed last Thursday and then again yesterday she was able to get it to stop pretty quickly both times.  She is also had a little bit of discomfort in the right ear.  Last time she had a nosebleed was a couple of years ago.  Struggling with some right shoulder pain on and off.  She had issues again maybe a couple years ago had a massage therapist really work on it and got a lot of relief.  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  She says the pain in the shoulder mostly just comes and goes it is a little bit bothersome it is mostly over the upper deltoid area. No loss of ROM.     Past Medical History:  Diagnosis Date  . Atrial flutter ([place])    2015  . Hearing loss    chronic  . Miscarriage    X 2  . Proteinuria    chronic (not related to DM)  .  Rheumatic heart disease    s/p aortic/ mitral valve replace    Past Surgical History:  Procedure Laterality Date  . double heart valve replacement  1999  . echo EF 75-80%    . SAB     X 2  . stapes surgery, left  2003   then removed 2 years later  . TAB      X 1  . TONSILLECTOMY  as a child    Family History  Problem Relation Age of Onset  . Diabetes Father   . Lung cancer Mother        smoked until her 29's  . Hypertension Mother   . Diabetes Father   . Hyperlipidemia Father   . Hypertension Father     Social History   Socioeconomic History  . Marital status: Married    Spouse name: Collier Salina  . Number of children: Not on file  . Years of education: Not on file  . Highest education level: Not on file  Occupational History  . Occupation: Retail banker: [employer]  Tobacco Use  . Smoking status: Never Smoker  . Smokeless tobacco: Never Used  Substance and Sexual Activity  . Alcohol use: No  .  Drug use: No  . Sexual activity: Yes    Partners: Male  Other Topics Concern  . Not on file  Social History Narrative   No regular exercise.     Social Determinants of Health   Financial Resource Strain:   . Difficulty of Paying Living Expenses: Not on file  Food Insecurity:   . Worried About Charity fundraiser in the Last Year: Not on file  . Ran Out of Food in the Last Year: Not on file  Transportation Needs:   . Lack of Transportation (Medical): Not on file  . Lack of Transportation (Non-Medical): Not on file  Physical Activity:   . Days of Exercise per Week: Not on file  . Minutes of Exercise per Session: Not on file  Stress:   . Feeling of Stress : Not on file  Social Connections:   . Frequency of Communication with Friends and Family: Not on file  . Frequency of Social Gatherings with Friends and Family: Not on file  . Attends Religious Services: Not on file  . Active Member of Clubs or Organizations: Not on file  . Attends Theatre manager Meetings: Not on file  . Marital Status: Not on file  Intimate Partner Violence:   . Fear of Current or Ex-Partner: Not on file  . Emotionally Abused: Not on file  . Physically Abused: Not on file  . Sexually Abused: Not on file    Outpatient Medications Prior to Visit  Medication Sig Dispense Refill  . ACCU-CHEK GUIDE test strip USE TO CHECK BLOOD SUGAR TWICE DAILY. DX: R73.01 200 strip 6  . b complex vitamins tablet Take 1 tablet by mouth daily.    . Blood Glucose Monitoring Suppl (ACCU-CHEK AVIVA PLUS) w/Device KIT Use to check blood sugar twice daily. Dx: R73.01 1 kit PRN  . Coenzyme Q-10 100 MG capsule Take 300 mg by mouth daily.    . Lancets (ACCU-CHEK MULTICLIX) lancets Use to check blood sugar twice daily. Dx: R73.01 100 each 12  . Lancets Misc. (ACCU-CHEK MULTICLIX LANCET DEV) KIT Use to check blood sugar twice daily. Dx: R73.01 1 each PRN  . metoprolol succinate (TOPROL-XL) 100 MG 24 hr tablet Take 1 tablet (100 mg total) by mouth daily. Take with or immediately following a meal. 90 tablet 1  . rosuvastatin (CRESTOR) 10 MG tablet Take 1.5 tablets (15 mg total) by mouth daily. TAKE 1 AND 1/2 TABLET DAILY 135 tablet 3  . Vitamin K, Phytonadione, 100 MCG TABS Take 100 mcg daily by mouth.    . warfarin (COUMADIN) 1 MG tablet 6 mg (5 mg x 1 and 1 mg x 1) every Mon, Wed, Fri; 5 mg (5 mg x 1) all other days as directed by [facility] Clinic (take with '5mg'$  tablets daily)    . warfarin (COUMADIN) 5 MG tablet TAKE 1 TABLET (5 MG) BY MOUTH DAILY EXCEPT FOR MON/FRI TAKE 1/2 TABLET (2.'5MG'$ ) OR AS DIRECTED BY COUMADIN CLINIC.    . hydrochlorothiazide (HYDRODIURIL) 25 MG tablet     . lidocaine (XYLOCAINE) 5 % ointment Apply 1 application topically 3 (three) times daily as needed. 50 g 0  . Olmesartan-amLODIPine-HCTZ 40-5-25 MG TABS TAKE 1 TABLET BY MOUTH EVERY DAY 30 tablet 1   No facility-administered medications prior to visit.    Allergies  Allergen Reactions  . Erythromycin      REACTION: renal toxicity  . Gemfibrozil Other (See Comments)    Acid reflux  . Shellfish Allergy  Other reaction(s): Other [place]helps all over her body  . Versed [Midazolam]     REACTION: rash, fever  . Contrast Media [Iodinated Diagnostic Agents] Rash    Fever  . Iodine Rash    Contrast media  . Lipitor [Atorvastatin] Nausea Only    Stomach pain  . Metformin And Related Other (See Comments)    Nausea, loose stools, fatigue   . Protonix [Pantoprazole Sodium] Other (See Comments)    Nause, fatigue, muscle cramping.     ROS Review of Systems    Objective:    Physical Exam  Constitutional: She is oriented to person, place, and time. She appears well-developed and well-nourished.  HENT:  Head: Normocephalic and atraumatic.  Right Ear: External ear normal.  Left Ear: External ear normal.  Nose: Nose normal.  TMs and canals are clear bilaterally.  Nares appeared normal with no active bleeding or ulcerations or scabbing.  Cardiovascular: Normal rate, regular rhythm and normal heart sounds.  Pulmonary/Chest: Effort normal and breath sounds normal.  Musculoskeletal:     Comments: Right shoulder with normal range of motion.  Neurological: She is alert and oriented to person, place, and time.  Skin: Skin is warm and dry.  Psychiatric: She has a normal mood and affect. Her behavior is normal.    BP (!) 136/53   Pulse (!) 53   Ht '5\' 2"'$  (1.575 m)   Wt 154 lb (69.9 kg)   SpO2 100%   BMI 28.17 kg/m  Wt Readings from Last 3 Encounters:  04/18/19 154 lb (69.9 kg)  01/10/19 150 lb (68 kg)  12/27/18 152 lb (68.9 kg)     Health Maintenance Due  Topic Date Due  . OPHTHALMOLOGY EXAM  12/18/2018  . PAP SMEAR-Modifier  06/11/2019    There are no preventive care reminders to display for this patient.  Lab Results  Component Value Date   TSH 3.58 09/09/2018   Lab Results  Component Value Date   WBC 8.1 09/09/2018   HGB 14.0 09/09/2018   HCT 42.6 09/09/2018   MCV 90.3  09/09/2018   PLT 266 09/09/2018   Lab Results  Component Value Date   NA 140 09/09/2018   K 4.2 09/09/2018   CO2 29 09/09/2018   GLUCOSE 156 (H) 09/09/2018   BUN 26 (H) 09/09/2018   CREATININE 0.82 09/09/2018   BILITOT 0.6 09/09/2018   ALKPHOS 59 07/12/2015   AST 31 09/09/2018   ALT 37 (H) 09/09/2018   PROT 6.8 09/09/2018   ALBUMIN 4.3 07/12/2015   CALCIUM 9.1 09/09/2018   Lab Results  Component Value Date   CHOL 140 09/09/2018   Lab Results  Component Value Date   HDL 43 (L) 09/09/2018   Lab Results  Component Value Date   LDLCALC 76 09/09/2018   Lab Results  Component Value Date   TRIG 125 09/09/2018   Lab Results  Component Value Date   CHOLHDL 3.3 09/09/2018   Lab Results  Component Value Date   HGBA1C 6.8 (A) 04/18/2019      Assessment & Plan:   Problem List Items Addressed This Visit      Cardiovascular and Mediastinum   Essential hypertension    Home BPs are well controlled.       Relevant Medications   Olmesartan-amLODIPine-HCTZ 40-5-25 MG TABS     Endocrine   New onset type 2 diabetes mellitus ([place]) - Primary    When seen jumped up significantly from previous.  Discussed options.  We will  start either Januvia or Onglyza I will see what might be better covered with her insurance.  Follow-up in 3 months.      Relevant Medications   Olmesartan-amLODIPine-HCTZ 40-5-25 MG TABS   sitaGLIPtin (JANUVIA) 25 MG tablet   Other Relevant Orders   POCT glycosylated hemoglobin (Hb A1C) (Completed)    Other Visit Diagnoses    Bleeding from the nose       Acute pain of right shoulder         Nosebleed-no specific lesions on exam.  Discussed running a humidifier in the home to moisturize the air.  If any point she develops any soreness tenderness or redness please let me know.  For now she can also treat with topical Vaseline inside the nose.  If nosebleeding continues to recur then can refer to ENT for evaluation for possible cautery.  Right shoulder  pain-consider bursitis versus tendinitis.  Just continue to work on home stretches and exercises that she has been doing if at any point pain worsens or persist for more than happy to get her in with sports medicine for further evaluation.  Lateral epicondylitis, right elbow-doing home treatments on her own.  Did not evaluate further.  She feels confident that she can improve it on her own.   Meds ordered this encounter  Medications  . Olmesartan-amLODIPine-HCTZ 40-5-25 MG TABS    Sig: Take 1 tablet by mouth daily.    Dispense:  90 tablet    Refill:  1  . sitaGLIPtin (JANUVIA) 25 MG tablet    Sig: Take 1 tablet (25 mg total) by mouth daily.    Dispense:  90 tablet    Refill:  1    Follow-up: Return in about 3 months (around 07/19/2019) for Diabetes follow-up.    Beatrice Lecher, MD

## 2019-04-21 ENCOUNTER — Encounter: Payer: Self-pay | Admitting: Family Medicine

## 2019-04-21 NOTE — Telephone Encounter (Signed)
Forwarding to provider as an FYI. °

## 2019-04-25 ENCOUNTER — Telehealth: Payer: Self-pay | Admitting: *Deleted

## 2019-04-25 NOTE — Telephone Encounter (Signed)
Form completed, scanned and mailed to:  [facility] [address] [place], [place] [address].Heath Gold, CMA

## 2019-07-18 ENCOUNTER — Ambulatory Visit: Payer: BC Managed Care – PPO | Admitting: Family Medicine

## 2019-08-22 ENCOUNTER — Other Ambulatory Visit: Payer: Self-pay | Admitting: Family Medicine

## 2019-08-30 ENCOUNTER — Other Ambulatory Visit: Payer: Self-pay | Admitting: Family Medicine

## 2019-09-19 ENCOUNTER — Encounter: Payer: Self-pay | Admitting: Family Medicine

## 2019-09-19 ENCOUNTER — Other Ambulatory Visit: Payer: Self-pay

## 2019-09-19 ENCOUNTER — Ambulatory Visit (INDEPENDENT_AMBULATORY_CARE_PROVIDER_SITE_OTHER): Payer: BC Managed Care – PPO | Admitting: Family Medicine

## 2019-09-19 ENCOUNTER — Other Ambulatory Visit (HOSPITAL_COMMUNITY)
Admission: RE | Admit: 2019-09-19 | Discharge: 2019-09-19 | Disposition: A | Discharge status: Home / Self Care | Payer: BC Managed Care – PPO | Source: Ambulatory Visit | Attending: Family Medicine | Admitting: Family Medicine

## 2019-09-19 VITALS — BP 130/46 | HR 62 | Ht 62.0 in | Wt 154.0 lb

## 2019-09-19 DIAGNOSIS — I10 Essential (primary) hypertension: Secondary | ICD-10-CM | POA: Diagnosis not present

## 2019-09-19 DIAGNOSIS — Z0000 Encounter for general adult medical examination without abnormal findings: Secondary | ICD-10-CM | POA: Insufficient documentation

## 2019-09-19 DIAGNOSIS — Z124 Encounter for screening for malignant neoplasm of cervix: Secondary | ICD-10-CM | POA: Insufficient documentation

## 2019-09-19 NOTE — Patient Instructions (Signed)
Health Maintenance, Female Adopting a healthy lifestyle and getting preventive care are important in promoting health and wellness. Ask your health care provider about:  The right schedule for you to have regular tests and exams.  Things you can do on your own to prevent diseases and keep yourself healthy. What should I know about diet, weight, and exercise? Eat a healthy diet   Eat a diet that includes plenty of vegetables, fruits, low-fat dairy products, and lean protein.  Do not eat a lot of foods that are high in solid fats, added sugars, or sodium. Maintain a healthy weight Body mass index (BMI) is used to identify weight problems. It estimates body fat based on height and weight. Your health care provider can help determine your BMI and help you achieve or maintain a healthy weight. Get regular exercise Get regular exercise. This is one of the most important things you can do for your health. Most adults should:  Exercise for at least 150 minutes each week. The exercise should increase your heart rate and make you sweat (moderate-intensity exercise).  Do strengthening exercises at least twice a week. This is in addition to the moderate-intensity exercise.  Spend less time sitting. Even light physical activity can be beneficial. Watch cholesterol and blood lipids Have your blood tested for lipids and cholesterol at 63 years of age, then have this test every 5 years. Have your cholesterol levels checked more often if:  Your lipid or cholesterol levels are high.  You are older than 63 years of age.  You are at high risk for heart disease. What should I know about cancer screening? Depending on your health history and family history, you may need to have cancer screening at various ages. This may include screening for:  Breast cancer.  Cervical cancer.  Colorectal cancer.  Skin cancer.  Lung cancer. What should I know about heart disease, diabetes, and high blood  pressure? Blood pressure and heart disease  High blood pressure causes heart disease and increases the risk of stroke. This is more likely to develop in people who have high blood pressure readings, are of African descent, or are overweight.  Have your blood pressure checked: ? Every 3-5 years if you are 18-39 years of age. ? Every year if you are 40 years old or older. Diabetes Have regular diabetes screenings. This checks your fasting blood sugar level. Have the screening done:  Once every three years after age 40 if you are at a normal weight and have a low risk for diabetes.  More often and at a younger age if you are overweight or have a high risk for diabetes. What should I know about preventing infection? Hepatitis B If you have a higher risk for hepatitis B, you should be screened for this virus. Talk with your health care provider to find out if you are at risk for hepatitis B infection. Hepatitis C Testing is recommended for:  Everyone born from 1945 through 1965.  Anyone with known risk factors for hepatitis C. Sexually transmitted infections (STIs)  Get screened for STIs, including gonorrhea and chlamydia, if: ? You are sexually active and are younger than 63 years of age. ? You are older than 63 years of age and your health care provider tells you that you are at risk for this type of infection. ? Your sexual activity has changed since you were last screened, and you are at increased risk for chlamydia or gonorrhea. Ask your health care provider if   you are at risk.  Ask your health care provider about whether you are at high risk for HIV. Your health care provider may recommend a prescription medicine to help prevent HIV infection. If you choose to take medicine to prevent HIV, you should first get tested for HIV. You should then be tested every 3 months for as long as you are taking the medicine. Pregnancy  If you are about to stop having your period (premenopausal) and  you may become pregnant, seek counseling before you get pregnant.  Take 400 to 800 micrograms (mcg) of folic acid every day if you become pregnant.  Ask for birth control (contraception) if you want to prevent pregnancy. Osteoporosis and menopause Osteoporosis is a disease in which the bones lose minerals and strength with aging. This can result in bone fractures. If you are 65 years old or older, or if you are at risk for osteoporosis and fractures, ask your health care provider if you should:  Be screened for bone loss.  Take a calcium or vitamin D supplement to lower your risk of fractures.  Be given hormone replacement therapy (HRT) to treat symptoms of menopause. Follow these instructions at home: Lifestyle  Do not use any products that contain nicotine or tobacco, such as cigarettes, e-cigarettes, and chewing tobacco. If you need help quitting, ask your health care provider.  Do not use street drugs.  Do not share needles.  Ask your health care provider for help if you need support or information about quitting drugs. Alcohol use  Do not drink alcohol if: ? Your health care provider tells you not to drink. ? You are pregnant, may be pregnant, or are planning to become pregnant.  If you drink alcohol: ? Limit how much you use to 0-1 drink a day. ? Limit intake if you are breastfeeding.  Be aware of how much alcohol is in your drink. In the [place] one drink equals one 12 oz bottle of beer (355 mL), one 5 oz glass of wine (148 mL), or one 1 oz glass of hard liquor (44 mL). General instructions  Schedule regular health, dental, and eye exams.  Stay current with your vaccines.  Tell your health care provider if: ? You often feel depressed. ? You have ever been abused or do not feel safe at home. Summary  Adopting a healthy lifestyle and getting preventive care are important in promoting health and wellness.  Follow your health care provider's instructions about healthy  diet, exercising, and getting tested or screened for diseases.  Follow your health care provider's instructions on monitoring your cholesterol and blood pressure. This information is not intended to replace advice given to you by your health care provider. Make sure you discuss any questions you have with your health care provider. Document Revised: 01/26/2018 Document Reviewed: 01/26/2018 Elsevier Patient Education  2020 Elsevier Inc.  

## 2019-09-19 NOTE — Progress Notes (Addendum)
Subjective:     Erin Hill is a 64 y.o. female and is here for a comprehensive physical exam. The patient reports no problems.  She is doing well overall she does exercise for about 15 minutes a day 6 to 7 days/week.  She also would let me know she recently started a supplement called supersede to help boost her immune system.  She says her eye exam is up-to-date at [facility].  Social History   Socioeconomic History  . Marital status: Married    Spouse name: Theron Arista  . Number of children: Not on file  . Years of education: Not on file  . Highest education level: Not on file  Occupational History  . Occupation: Printmaker: [employer]  Tobacco Use  . Smoking status: Never Smoker  . Smokeless tobacco: Never Used  Substance and Sexual Activity  . Alcohol use: No  . Drug use: No  . Sexual activity: Yes    Partners: Male  Other Topics Concern  . Not on file  Social History Narrative   No regular exercise.     Social Determinants of Health   Financial Resource Strain:   . Difficulty of Paying Living Expenses:   Food Insecurity:   . Worried About Programme researcher, broadcasting/film/video in the Last Year:   . Barista in the Last Year:   Transportation Needs:   . Freight forwarder (Medical):   Marland Kitchen Lack of Transportation (Non-Medical):   Physical Activity:   . Days of Exercise per Week:   . Minutes of Exercise per Session:   Stress:   . Feeling of Stress :   Social Connections:   . Frequency of Communication with Friends and Family:   . Frequency of Social Gatherings with Friends and Family:   . Attends Religious Services:   . Active Member of Clubs or Organizations:   . Attends Banker Meetings:   Marland Kitchen Marital Status:   Intimate Partner Violence:   . Fear of Current or Ex-Partner:   . Emotionally Abused:   Marland Kitchen Physically Abused:   . Sexually Abused:    Health Maintenance  Topic Date Due  . OPHTHALMOLOGY EXAM  12/18/2018  . PAP SMEAR-Modifier   06/11/2019  . INFLUENZA VACCINE  09/17/2019  . HEMOGLOBIN A1C  10/19/2019  . FOOT EXAM  12/13/2019  . MAMMOGRAM  10/31/2020  . TETANUS/TDAP  07/11/2025  . COLONOSCOPY  10/24/2028  . PNEUMOCOCCAL POLYSACCHARIDE VACCINE AGE 73-64 HIGH RISK  Completed  . COVID-19 Vaccine  Completed  . Hepatitis C Screening  Completed  . HIV Screening  Completed    The following portions of the patient's history were reviewed and updated as appropriate: allergies, current medications, past family history, past medical history, past social history, past surgical history and problem list.  Review of Systems A comprehensive review of systems was negative.   Objective:    BP (!) 130/46   Pulse 62   Ht 5\' 2"  (1.575 m)   Wt 154 lb (69.9 kg)   SpO2 99%   BMI 28.17 kg/m  General appearance: alert, cooperative and appears stated age Head: Normocephalic, without obvious abnormality, atraumatic Eyes: conj clear, EOMI, PEERLA Ears: normal TM's and external ear canals both ears Nose: Nares normal. Septum midline. Mucosa normal. No drainage or sinus tenderness. Throat: lips, mucosa, and tongue normal; teeth and gums normal Neck: no adenopathy, no carotid bruit, no JVD, supple, symmetrical, trachea midline and thyroid not enlarged,  symmetric, no tenderness/mass/nodules Back: symmetric, no curvature. ROM normal. No CVA tenderness. Lungs: clear to auscultation bilaterally Breasts: normal appearance, no masses or tenderness Heart: regular rate and rhythm, S1, S2 normal, no murmur, click, rub or gallop Abdomen: soft, non-tender; bowel sounds normal; no masses,  no organomegaly Pelvic: cervix normal in appearance, external genitalia normal, no adnexal masses or tenderness, no cervical motion tenderness, rectovaginal septum normal, uterus normal size, shape, and consistency and vagina normal without discharge Extremities: extremities normal, atraumatic, no cyanosis or edema Pulses: 2+ and symmetric Skin: Skin color,  texture, turgor normal. No rashes or lesions Lymph nodes: Cervical, supraclavicular, and axillary nodes normal. Neurologic: Alert and oriented X 3, normal strength and tone. Normal symmetric reflexes. Normal coordination and gait    Assessment:    Healthy female exam.      Plan:     See After Visit Summary for Counseling Recommendations   Keep up a regular exercise program and make sure you are eating a healthy diet Try to eat 4 servings of dairy a day, or if you are lactose intolerant take a calcium with vitamin D daily.  Your vaccines are up to date.

## 2019-09-20 LAB — LIPID PANEL W/REFLEX DIRECT LDL
Cholesterol: 117 mg/dL (ref <200)
HDL: 42 mg/dL — ABNORMAL LOW (ref >=50)
LDL Cholesterol (Calc): 55 mg/dL (calc)
Non-HDL Cholesterol (Calc): 75 mg/dL (calc) (ref <130)
Total CHOL/HDL Ratio: 2.8 (calc) (ref <5.0)
Triglycerides: 117 mg/dL (ref <150)

## 2019-09-20 LAB — COMPLETE METABOLIC PANEL WITH GFR
AG Ratio: 1.4 (calc) (ref 1.0–2.5)
ALT: 20 U/L (ref 6–29)
AST: 24 U/L (ref 10–35)
Albumin: 4 g/dL (ref 3.6–5.1)
Alkaline phosphatase (APISO): 43 U/L (ref 37–153)
BUN: 19 mg/dL (ref 7–25)
CO2: 29 mmol/L (ref 20–32)
Calcium: 8.9 mg/dL (ref 8.6–10.4)
Chloride: 105 mmol/L (ref 98–110)
Creat: 0.85 mg/dL (ref 0.50–0.99)
GFR, Est African American: 85 mL/min/{1.73_m2} (ref >=60)
GFR, Est Non African American: 73 mL/min/{1.73_m2} (ref >=60)
Globulin: 2.8 g/dL (calc) (ref 1.9–3.7)
Glucose, Bld: 122 mg/dL — ABNORMAL HIGH (ref 65–99)
Potassium: 4.2 mmol/L (ref 3.5–5.3)
Sodium: 139 mmol/L (ref 135–146)
Total Bilirubin: 0.8 mg/dL (ref 0.2–1.2)
Total Protein: 6.8 g/dL (ref 6.1–8.1)

## 2019-09-20 LAB — HEMOGLOBIN A1C
Hgb A1c MFr Bld: 5.8 % of total Hgb — ABNORMAL HIGH (ref <5.7)
Mean Plasma Glucose: 120 (calc)
eAG (mmol/L): 6.6 (calc)

## 2019-09-21 LAB — CYTOLOGY - PAP
Comment: NEGATIVE
Diagnosis: NEGATIVE
High risk HPV: NEGATIVE

## 2019-10-18 ENCOUNTER — Other Ambulatory Visit: Payer: Self-pay | Admitting: Family Medicine

## 2019-10-18 DIAGNOSIS — I10 Essential (primary) hypertension: Secondary | ICD-10-CM

## 2019-10-24 ENCOUNTER — Ambulatory Visit: Payer: BC Managed Care – PPO | Admitting: Family Medicine

## 2019-11-03 ENCOUNTER — Ambulatory Visit (INDEPENDENT_AMBULATORY_CARE_PROVIDER_SITE_OTHER): Payer: BC Managed Care – PPO | Admitting: Family Medicine

## 2019-11-03 VITALS — Temp 97.5°F

## 2019-11-03 DIAGNOSIS — Z23 Encounter for immunization: Secondary | ICD-10-CM | POA: Diagnosis not present

## 2019-11-03 NOTE — Progress Notes (Signed)
Pt here for flu shot. Afebrile,no recent illness. Vaccination given, pt tolerated well.  

## 2019-11-17 LAB — HM MAMMOGRAPHY

## 2019-12-29 LAB — HM DIABETES EYE EXAM

## 2020-01-16 ENCOUNTER — Other Ambulatory Visit: Payer: Self-pay | Admitting: Family Medicine

## 2020-03-18 ENCOUNTER — Other Ambulatory Visit: Payer: Self-pay | Admitting: Family Medicine

## 2020-03-18 DIAGNOSIS — E119 Type 2 diabetes mellitus without complications: Secondary | ICD-10-CM

## 2020-03-21 ENCOUNTER — Ambulatory Visit: Payer: BC Managed Care – PPO | Admitting: Family Medicine

## 2020-03-26 ENCOUNTER — Telehealth: Payer: Self-pay

## 2020-03-26 NOTE — Telephone Encounter (Signed)
Transition Care Management Unsuccessful Follow-up Telephone Call  Date of discharge and from where:  03/25/20 from [facility]  Attempts:  1st Attempt  Reason for unsuccessful TCM follow-up call:  Left voice message

## 2020-03-27 NOTE — Telephone Encounter (Signed)
Transition Care Management Unsuccessful Follow-up Telephone Call  Date of discharge and from where:  03/25/20 from [facility]  Attempts:  2nd Attempt  Reason for unsuccessful TCM follow-up call:  Unable to leave message

## 2020-03-28 NOTE — Telephone Encounter (Signed)
Transition Care Management Unsuccessful Follow-up Telephone Call  Date of discharge and from where:  03/25/20 from [facility]  Attempts:  3rd Attempt  Reason for unsuccessful TCM follow-up call:  Unable to reach patient

## 2020-04-02 ENCOUNTER — Encounter: Payer: Self-pay | Admitting: Family Medicine

## 2020-04-02 ENCOUNTER — Other Ambulatory Visit: Payer: Self-pay

## 2020-04-02 ENCOUNTER — Ambulatory Visit (INDEPENDENT_AMBULATORY_CARE_PROVIDER_SITE_OTHER): Payer: BC Managed Care – PPO | Admitting: Family Medicine

## 2020-04-02 VITALS — BP 139/50 | HR 69 | Ht 62.0 in | Wt 158.0 lb

## 2020-04-02 DIAGNOSIS — S0990XA Unspecified injury of head, initial encounter: Secondary | ICD-10-CM

## 2020-04-02 DIAGNOSIS — S139XXA Sprain of joints and ligaments of unspecified parts of neck, initial encounter: Secondary | ICD-10-CM | POA: Diagnosis not present

## 2020-04-02 DIAGNOSIS — M533 Sacrococcygeal disorders, not elsewhere classified: Secondary | ICD-10-CM | POA: Diagnosis not present

## 2020-04-02 NOTE — Progress Notes (Signed)
Established Patient Office Visit  Subjective:  Patient ID: Erin Hill, female    DOB: 02/04/1956  Age: 65 y.o. MRN: [id number]  CC:  Chief Complaint  Patient presents with  . Hospitalization Follow-up    HPI Erin Hill presents for follow-up from recent emergency department visit.  On February 7 she actually slipped on ice and hit the back of her head on concrete.  No loss of consciousness.  She was seen at [facility].  She presented to the emergency room she did have a mild headache.  In some mild discomfort with rotation of her neck.  CT of her head was negative.  CT of her neck was negative for fracture she does have some degenerative disc disease at C7-T1 as well as some facet joint degeneration.  Since then she has been doing okay she is gone for a couple of massages she has been working on some gentle stretching particularly on her neck.  She did have a headache for about 2 days afterward mostly over both eyebrows and into her temples but that seems to have resolved. She has been using Tylenol.  She said she did not try the muscle relaxer that they gave her she really does not like to take a lot of medication.  She still has some pain in her neck with rotation.  She also c/o of tailbone pain as well. Noticed it the day after. They didn't evaluate this in the ED.    Past Medical History:  Diagnosis Date  . Atrial flutter ([place])    2015  . Hearing loss    chronic  . Miscarriage    X 2  . Proteinuria    chronic (not related to DM)  . Rheumatic heart disease    s/p aortic/ mitral valve replace    Past Surgical History:  Procedure Laterality Date  . double heart valve replacement  1999  . echo EF 75-80%    . SAB     X 2  . stapes surgery, left  2003   then removed 2 years later  . TAB      X 1  . TONSILLECTOMY  as a child    Family History  Problem Relation Age of Onset  . Diabetes Father   . Lung cancer Mother        smoked until her 40's  . Hypertension  Mother   . Diabetes Father   . Hyperlipidemia Father   . Hypertension Father     Social History   Socioeconomic History  . Marital status: Married    Spouse name: Collier Salina  . Number of children: Not on file  . Years of education: Not on file  . Highest education level: Not on file  Occupational History  . Occupation: Retail banker: [employer]  Tobacco Use  . Smoking status: Never Smoker  . Smokeless tobacco: Never Used  Substance and Sexual Activity  . Alcohol use: No  . Drug use: No  . Sexual activity: Yes    Partners: Male  Other Topics Concern  . Not on file  Social History Narrative   No regular exercise.     Social Determinants of Health   Financial Resource Strain: Not on file  Food Insecurity: Not on file  Transportation Needs: Not on file  Physical Activity: Not on file  Stress: Not on file  Social Connections: Not on file  Intimate Partner Violence: Not on file    Outpatient  Medications Prior to Visit  Medication Sig Dispense Refill  . ACCU-CHEK GUIDE test strip USE TO CHECK BLOOD SUGAR TWICE DAILY. DX: R73.01 200 strip 6  . b complex vitamins tablet Take 1 tablet by mouth daily.    . Blood Glucose Monitoring Suppl (ACCU-CHEK AVIVA PLUS) w/Device KIT Use to check blood sugar twice daily. Dx: R73.01 1 kit PRN  . Coenzyme Q-10 100 MG capsule Take 300 mg by mouth daily.    Marland Kitchen JANUVIA 25 MG tablet TAKE ONE TABLET BY MOUTH DAILY 90 tablet 2  . Lancets (ACCU-CHEK MULTICLIX) lancets Use to check blood sugar twice daily. Dx: R73.01 100 each 12  . Lancets Misc. (ACCU-CHEK MULTICLIX LANCET DEV) KIT Use to check blood sugar twice daily. Dx: R73.01 1 each PRN  . metoprolol succinate (TOPROL-XL) 100 MG 24 hr tablet TAKE 1 TABLET BY MOUTH DAILY WITH OR IMMEDIATELY FOLLOWING A MEAL. 90 tablet 1  . Olmesartan-amLODIPine-HCTZ 40-5-25 MG TABS TAKE 1 TABLET BY MOUTH EVERY DAY 90 tablet 1  . OVER THE COUNTER MEDICATION Super C ( vit D, Vit D, zinc)    .  rosuvastatin (CRESTOR) 10 MG tablet TAKE 1 AND 1/2 TABLETS ONCE A DAY 135 tablet 3  . Vitamin K, Phytonadione, 100 MCG TABS Take 100 mcg daily by mouth.    . warfarin (COUMADIN) 5 MG tablet TAKE 1 TABLET (5 MG) BY MOUTH DAILY EXCEPT FOR MON/FRI TAKE 1/2 TABLET (2.$RemoveBefor'5MG'[id number]$ ) OR AS DIRECTED BY COUMADIN CLINIC.     No facility-administered medications prior to visit.    Allergies  Allergen Reactions  . Erythromycin     REACTION: renal toxicity  . Gemfibrozil Other (See Comments)    Acid reflux  . Shellfish Allergy     Other reaction(s): Other Whelps all over her body  . Versed [Midazolam]     REACTION: rash, fever  . Contrast Media [Iodinated Diagnostic Agents] Rash    Fever  . Iodine Rash    Contrast media  . Lipitor [Atorvastatin] Nausea Only    Stomach pain  . Metformin And Related Other (See Comments)    Nausea, loose stools, fatigue   . Protonix [Pantoprazole Sodium] Other (See Comments)    Nause, fatigue, muscle cramping.     ROS Review of Systems    Objective:    Physical Exam Vitals reviewed.  Constitutional:      Appearance: She is well-developed and well-nourished.  HENT:     Head: Normocephalic and atraumatic.  Eyes:     Extraocular Movements: EOM normal.     Conjunctiva/sclera: Conjunctivae normal.  Cardiovascular:     Rate and Rhythm: Normal rate.  Pulmonary:     Effort: Pulmonary effort is normal.  Musculoskeletal:     Comments: Normal cervical flexion and extension she had pain with extension on the right posterior side of her neck.  Symmetric rotation right and left foot pain with full range.  And symmetric side bending right and left but again pain with flexion.  She is tender just at the occiput on the right side.  She is also mildly tender over the tip of the coccyx.  Skin:    General: Skin is dry.     Coloration: Skin is not pale.  Neurological:     Mental Status: She is alert and oriented to person, place, and time.  Psychiatric:        Mood and  Affect: Mood and affect normal.        Behavior: Behavior normal.  BP (!) 139/50   Pulse 69   Ht $R'5\' 2"'NX$  (1.575 m)   Wt 158 lb (71.7 kg)   SpO2 99%   BMI 28.90 kg/m  Wt Readings from Last 3 Encounters:  04/02/20 158 lb (71.7 kg)  09/19/19 154 lb (69.9 kg)  04/18/19 154 lb (69.9 kg)     Health Maintenance Due  Topic Date Due  . OPHTHALMOLOGY EXAM  12/18/2018  . FOOT EXAM  12/13/2019  . HEMOGLOBIN A1C  03/21/2020    There are no preventive care reminders to display for this patient.  Lab Results  Component Value Date   TSH 3.58 09/09/2018   Lab Results  Component Value Date   WBC 8.1 09/09/2018   HGB 14.0 09/09/2018   HCT 42.6 09/09/2018   MCV 90.3 09/09/2018   PLT 266 09/09/2018   Lab Results  Component Value Date   NA 139 09/19/2019   K 4.2 09/19/2019   CO2 29 09/19/2019   GLUCOSE 122 (H) 09/19/2019   BUN 19 09/19/2019   CREATININE 0.85 09/19/2019   BILITOT 0.8 09/19/2019   ALKPHOS 59 07/12/2015   AST 24 09/19/2019   ALT 20 09/19/2019   PROT 6.8 09/19/2019   ALBUMIN 4.3 07/12/2015   CALCIUM 8.9 09/19/2019   Lab Results  Component Value Date   CHOL 117 09/19/2019   Lab Results  Component Value Date   HDL 42 (L) 09/19/2019   Lab Results  Component Value Date   LDLCALC 55 09/19/2019   Lab Results  Component Value Date   TRIG 117 09/19/2019   Lab Results  Component Value Date   CHOLHDL 2.8 09/19/2019   Lab Results  Component Value Date   HGBA1C 5.8 (H) 09/19/2019      Assessment & Plan:   Problem List Items Addressed This Visit   None   Visit Diagnoses    Closed head injury, initial encounter    -  Primary   Neck sprain, initial encounter       Coccyx pain         Closed head injury-she did have a persistent headache for about 2 days but that has completely resolved she is not had any residual symptoms such as dizziness or lightheadedness or confusion.  Neck sprain-she does have pain with rotation but no limitations.  She has  been doing massage therapy and doing some stretches.  Gave her an extra handout on stretches today offered to do more formal physical therapy if she feels like she is not improving over the next couple of weeks.  Coccygeal pain status post fall-offered to do x-ray today.  She declined and said she will just continue to keep an eye on it.  Try to keep pressure off of it she can do a U-shaped cushion if needed.  Call back if pain is persisting and she wants [place] to do any additional work-up.  No orders of the defined types were placed in this encounter.   Follow-up: Return if symptoms worsen or fail to improve.    Beatrice Lecher, MD

## 2020-04-02 NOTE — Patient Instructions (Signed)

## 2020-04-12 ENCOUNTER — Other Ambulatory Visit: Payer: Self-pay | Admitting: Family Medicine

## 2020-04-12 DIAGNOSIS — I10 Essential (primary) hypertension: Secondary | ICD-10-CM

## 2020-04-30 ENCOUNTER — Encounter: Payer: Self-pay | Admitting: Family Medicine

## 2020-05-06 ENCOUNTER — Encounter: Payer: Self-pay | Admitting: Family Medicine

## 2020-05-06 ENCOUNTER — Ambulatory Visit (INDEPENDENT_AMBULATORY_CARE_PROVIDER_SITE_OTHER): Payer: BC Managed Care – PPO | Admitting: Family Medicine

## 2020-05-06 ENCOUNTER — Other Ambulatory Visit: Payer: Self-pay

## 2020-05-06 VITALS — BP 139/52 | HR 65 | Ht 62.0 in | Wt 158.0 lb

## 2020-05-06 DIAGNOSIS — L230 Allergic contact dermatitis due to metals: Secondary | ICD-10-CM | POA: Diagnosis not present

## 2020-05-06 MED ORDER — CLOBETASOL PROPIONATE 0.05 % EX CREA: 1.0000 "application " | TOPICAL_CREAM | Freq: Two times a day (BID) | CUTANEOUS | 0 refills | Status: DC | PRN

## 2020-05-06 NOTE — Progress Notes (Signed)
Acute Office Visit  Subjective:    Patient ID: Erin Hill, female    DOB: 1955-10-22, 65 y.o.   MRN: [id number]  Chief Complaint  Patient presents with  . Rash    HPI Patient is in today for rash on the dorsum of her left forearm.  She was wearing a fit bit that has a metal base and got some moisture trapped underneath after washing her hands.  She started to notice a red very itchy scaly thick rash she said even had tiny blisters that had a sticky fluid come out of them.  She quit wearing the watch and has been applying a antibiotic ointment.  She said she tried to put the right watch on her right wrist and said within about 3 hours it started to turn red and feel very itchy and so has not worn it since.  She says that the rash is starting to finally look a little bit better.  She also notes that in the wintertime she tends to get and itchy rash on the palm of her right hand only so she tries to wear gloves if she is can get her hands wet.  Past Medical History:  Diagnosis Date  . Atrial flutter ([place])    2015  . Hearing loss    chronic  . Miscarriage    X 2  . Proteinuria    chronic (not related to DM)  . Rheumatic heart disease    s/p aortic/ mitral valve replace    Past Surgical History:  Procedure Laterality Date  . double heart valve replacement  1999  . echo EF 75-80%    . SAB     X 2  . stapes surgery, left  2003   then removed 2 years later  . TAB      X 1  . TONSILLECTOMY  as a child    Family History  Problem Relation Age of Onset  . Diabetes Father   . Lung cancer Mother        smoked until her 48's  . Hypertension Mother   . Diabetes Father   . Hyperlipidemia Father   . Hypertension Father     Social History   Socioeconomic History  . Marital status: Married    Spouse name: Collier Salina  . Number of children: Not on file  . Years of education: Not on file  . Highest education level: Not on file  Occupational History  . Occupation: Research officer, trade union: [employer]  Tobacco Use  . Smoking status: Never Smoker  . Smokeless tobacco: Never Used  Substance and Sexual Activity  . Alcohol use: No  . Drug use: No  . Sexual activity: Yes    Partners: Male  Other Topics Concern  . Not on file  Social History Narrative   No regular exercise.     Social Determinants of Health   Financial Resource Strain: Not on file  Food Insecurity: Not on file  Transportation Needs: Not on file  Physical Activity: Not on file  Stress: Not on file  Social Connections: Not on file  Intimate Partner Violence: Not on file    Outpatient Medications Prior to Visit  Medication Sig Dispense Refill  . ACCU-CHEK GUIDE test strip USE TO CHECK BLOOD SUGAR TWICE DAILY. DX: R73.01 200 strip 6  . b complex vitamins tablet Take 1 tablet by mouth daily.    . Blood Glucose Monitoring Suppl (ACCU-CHEK AVIVA PLUS) w/Device KIT  Use to check blood sugar twice daily. Dx: R73.01 1 kit PRN  . Coenzyme Q-10 100 MG capsule Take 300 mg by mouth daily.    Marland Kitchen JANUVIA 25 MG tablet TAKE ONE TABLET BY MOUTH DAILY 90 tablet 2  . Lancets (ACCU-CHEK MULTICLIX) lancets Use to check blood sugar twice daily. Dx: R73.01 100 each 12  . metoprolol succinate (TOPROL-XL) 100 MG 24 hr tablet TAKE 1 TABLET BY MOUTH DAILY WITH OR IMMEDIATELY FOLLOWING A MEAL. 90 tablet 1  . Olmesartan-amLODIPine-HCTZ 40-5-25 MG TABS TAKE 1 TABLET BY MOUTH EVERY DAY 90 tablet 1  . OVER THE COUNTER MEDICATION Super C ( vit D, Vit D, zinc)    . rosuvastatin (CRESTOR) 10 MG tablet TAKE 1 AND 1/2 TABLETS ONCE A DAY 135 tablet 3  . Vitamin K, Phytonadione, 100 MCG TABS Take 100 mcg daily by mouth.    . warfarin (COUMADIN) 5 MG tablet TAKE 1 TABLET (5 MG) BY MOUTH DAILY EXCEPT FOR MON/FRI TAKE 1/2 TABLET (2.$RemoveBefor'5MG'[id number]$ ) OR AS DIRECTED BY COUMADIN CLINIC.     No facility-administered medications prior to visit.    Allergies  Allergen Reactions  . Erythromycin     REACTION: renal toxicity  . Gemfibrozil  Other (See Comments)    Acid reflux  . Shellfish Allergy     Other reaction(s): Other Whelps all over her body  . Versed [Midazolam]     REACTION: rash, fever  . Contrast Media [Iodinated Diagnostic Agents] Rash    Fever  . Iodine Rash    Contrast media  . Lipitor [Atorvastatin] Nausea Only    Stomach pain  . Metformin And Related Other (See Comments)    Nausea, loose stools, fatigue   . Protonix [Pantoprazole Sodium] Other (See Comments)    Nause, fatigue, muscle cramping.   [place] (Diagnostic) Rash    Hives    Review of Systems     Objective:    Physical Exam Vitals reviewed.  Constitutional:      Appearance: She is well-developed.  HENT:     Head: Normocephalic and atraumatic.  Eyes:     Conjunctiva/sclera: Conjunctivae normal.  Cardiovascular:     Rate and Rhythm: Normal rate.  Pulmonary:     Effort: Pulmonary effort is normal.  Skin:    General: Skin is dry.     Coloration: Skin is not pale.     Comments: On the dorsum of the left forearm there is a very circular erythematous patch, approximately 3 cm with very thick scale on top.  It is quite distinct from the surrounding skin.  On the anterior forearm she has a smaller spot that is probably maybe a centimeter in size.  She said she had flipped her watch around to the opposite side.  Neurological:     Mental Status: She is alert and oriented to person, place, and time.  Psychiatric:        Behavior: Behavior normal.     BP (!) 139/52   Pulse 65   Ht $R'5\' 2"'CR$  (1.575 m)   Wt 158 lb (71.7 kg)   SpO2 99%   BMI 28.90 kg/m  Wt Readings from Last 3 Encounters:  05/06/20 158 lb (71.7 kg)  04/02/20 158 lb (71.7 kg)  09/19/19 154 lb (69.9 kg)    Health Maintenance Due  Topic Date Due  . FOOT EXAM  12/13/2019  . HEMOGLOBIN A1C  03/21/2020    There are no preventive care reminders to display for this patient.  Lab Results  Component Value Date   TSH 3.58 09/09/2018   Lab Results  Component  Value Date   WBC 8.1 09/09/2018   HGB 14.0 09/09/2018   HCT 42.6 09/09/2018   MCV 90.3 09/09/2018   PLT 266 09/09/2018   Lab Results  Component Value Date   NA 139 09/19/2019   K 4.2 09/19/2019   CO2 29 09/19/2019   GLUCOSE 122 (H) 09/19/2019   BUN 19 09/19/2019   CREATININE 0.85 09/19/2019   BILITOT 0.8 09/19/2019   ALKPHOS 59 07/12/2015   AST 24 09/19/2019   ALT 20 09/19/2019   PROT 6.8 09/19/2019   ALBUMIN 4.3 07/12/2015   CALCIUM 8.9 09/19/2019   Lab Results  Component Value Date   CHOL 117 09/19/2019   Lab Results  Component Value Date   HDL 42 (L) 09/19/2019   Lab Results  Component Value Date   LDLCALC 55 09/19/2019   Lab Results  Component Value Date   TRIG 117 09/19/2019   Lab Results  Component Value Date   CHOLHDL 2.8 09/19/2019   Lab Results  Component Value Date   HGBA1C 5.8 (H) 09/19/2019       Assessment & Plan:   Problem List Items Addressed This Visit   None   Visit Diagnoses    Allergic contact dermatitis due to metals    -  Primary     Allergic contact dermatitis-I suspect due to metals.  Encouraged her to check to see what metals might be in the fit bit she can wear gold and silver which is what she wears in her ears and rings and has no difficulty or problems.  Certainly moisture may have actually worsen things.  We will treat with topical steroid cream.  If not better in 1 week then please let me know.  Recommended not using for more than 2 consecutive weeks.  Call if any new problems we could always refer her to an allergist if she would like to have metal testing.  She said she had testing years ago for multiple foods.  She does note that she gets hives and a rash with strawberries.  Also suspect that she has dyshidrotic eczema on that right hand and can use the steroid there as well.   Meds ordered this encounter  Medications  . clobetasol cream (TEMOVATE) 0.05 %    Sig: Apply 1 application topically 2 (two) times daily as  needed.    Dispense:  30 g    Refill:  0     Beatrice Lecher, MD

## 2020-05-17 ENCOUNTER — Encounter: Payer: Self-pay | Admitting: Family Medicine

## 2020-07-05 ENCOUNTER — Ambulatory Visit: Payer: BC Managed Care – PPO | Admitting: Family Medicine

## 2020-07-15 ENCOUNTER — Other Ambulatory Visit: Payer: Self-pay | Admitting: Family Medicine

## 2020-07-15 DIAGNOSIS — I10 Essential (primary) hypertension: Secondary | ICD-10-CM

## 2020-08-09 ENCOUNTER — Encounter: Payer: Self-pay | Admitting: Family Medicine

## 2020-08-09 NOTE — Telephone Encounter (Signed)
Patient has been scheduled. AM 

## 2020-08-15 ENCOUNTER — Ambulatory Visit (INDEPENDENT_AMBULATORY_CARE_PROVIDER_SITE_OTHER): Payer: BC Managed Care – PPO | Admitting: Family Medicine

## 2020-08-15 ENCOUNTER — Encounter: Payer: Self-pay | Admitting: Family Medicine

## 2020-08-15 ENCOUNTER — Other Ambulatory Visit: Payer: Self-pay

## 2020-08-15 VITALS — BP 134/70 | HR 69 | Temp 97.8°F | Resp 16

## 2020-08-15 DIAGNOSIS — W57XXXA Bitten or stung by nonvenomous insect and other nonvenomous arthropods, initial encounter: Secondary | ICD-10-CM

## 2020-08-15 DIAGNOSIS — S30861A Insect bite (nonvenomous) of abdominal wall, initial encounter: Secondary | ICD-10-CM

## 2020-08-15 DIAGNOSIS — S40261A Insect bite (nonvenomous) of right shoulder, initial encounter: Secondary | ICD-10-CM | POA: Diagnosis not present

## 2020-08-15 NOTE — Progress Notes (Signed)
Acute Office Visit  Subjective:    Patient ID: Erin Hill, female    DOB: Sep 14, 1955, 65 y.o.   MRN: [id number]  Chief Complaint  Patient presents with   Insect Bite    HPI Patient is in today for questions about previous tick bite.   Over three weeks ago, patient found two ticks on her which she thinks probably came off of her dogs. The first one had just attached to her right shoulder when she felt it and immediately pulled it off. It was not engorged at all. A few hours later she felt an itching/stinging sensation in her left groin and found another tick that was attached and possibly slightly enlarged, but not fully engorged. She was able to remove it easily. Max time of attachment was 3 hours. She developed mild red splotchy rash and itching to both areas but no erythema migrans rash developed. Discomfort and redness resolved after a couple of days of using steroid cream and hand sanitizer to the areas. She has not had any further symptoms - no rashes, muscle/joint aches, fatigue, malaise, fevers, cardiopulmonary or GI/GU symptoms.      Past Medical History:  Diagnosis Date   Atrial flutter ([place])    2015   Hearing loss    chronic   Miscarriage    X 2   Proteinuria    chronic (not related to DM)   Rheumatic heart disease    s/p aortic/ mitral valve replace    Past Surgical History:  Procedure Laterality Date   double heart valve replacement  1999   echo EF 75-80%     SAB     X 2   stapes surgery, left  2003   then removed 2 years later   TAB      X 1   TONSILLECTOMY  as a child    Family History  Problem Relation Age of Onset   Diabetes Father    Lung cancer Mother        smoked until her 74's   Hypertension Mother    Diabetes Father    Hyperlipidemia Father    Hypertension Father     Social History   Socioeconomic History   Marital status: Married    Spouse name: Collier Salina   Number of children: Not on file   Years of education: Not on file    Highest education level: Not on file  Occupational History   Occupation: Retail banker: [employer]  Tobacco Use   Smoking status: Never   Smokeless tobacco: Never  Substance and Sexual Activity   Alcohol use: No   Drug use: No   Sexual activity: Yes    Partners: Male  Other Topics Concern   Not on file  Social History Narrative   No regular exercise.     Social Determinants of Health   Financial Resource Strain: Not on file  Food Insecurity: Not on file  Transportation Needs: Not on file  Physical Activity: Not on file  Stress: Not on file  Social Connections: Not on file  Intimate Partner Violence: Not on file    Outpatient Medications Prior to Visit  Medication Sig Dispense Refill   ACCU-CHEK GUIDE test strip USE TO CHECK BLOOD SUGAR TWICE DAILY. DX: R73.01 200 strip 6   b complex vitamins tablet Take 1 tablet by mouth daily.     Blood Glucose Monitoring Suppl (ACCU-CHEK AVIVA PLUS) w/Device KIT Use to check blood sugar twice daily.  Dx: R73.01 1 kit PRN   clobetasol cream (TEMOVATE) 0.96 % Apply 1 application topically 2 (two) times daily as needed. 30 g 0   Coenzyme Q-10 100 MG capsule Take 300 mg by mouth daily.     JANUVIA 25 MG tablet TAKE ONE TABLET BY MOUTH DAILY 90 tablet 2   Lancets (ACCU-CHEK MULTICLIX) lancets Use to check blood sugar twice daily. Dx: R73.01 100 each 12   metoprolol succinate (TOPROL-XL) 100 MG 24 hr tablet TAKE 1 TABLET BY MOUTH DAILY WITH OR IMMEDIATELY FOLLOWING A MEAL. 90 tablet 1   Olmesartan-amLODIPine-HCTZ 40-5-25 MG TABS TAKE 1 TABLET BY MOUTH EVERY DAY 90 tablet 1   OVER THE COUNTER MEDICATION Super C ( vit D, Vit D, zinc)     rosuvastatin (CRESTOR) 10 MG tablet TAKE 1.5 TABLETS BY MOUTH ONCE A DAY 135 tablet 3   Vitamin K, Phytonadione, 100 MCG TABS Take 100 mcg daily by mouth.     warfarin (COUMADIN) 5 MG tablet TAKE 1 TABLET (5 MG) BY MOUTH DAILY EXCEPT FOR MON/FRI TAKE 1/2 TABLET (2.5MG) OR AS DIRECTED BY COUMADIN  CLINIC.     No facility-administered medications prior to visit.    Allergies  Allergen Reactions   Erythromycin     REACTION: renal toxicity   Gemfibrozil Other (See Comments)    Acid reflux   Shellfish Allergy     Other reaction(s): Other Whelps all over her body   Versed [Midazolam]     REACTION: rash, fever   Contrast Media [Iodinated Diagnostic Agents] Rash    Fever   Iodine Rash    Contrast media   Lipitor [Atorvastatin] Nausea Only    Stomach pain   Metformin And Related Other (See Comments)    Nausea, loose stools, fatigue    Protonix [Pantoprazole Sodium] Other (See Comments)    Nause, fatigue, muscle cramping.    Strawberry (Diagnostic) Rash    Hives    Review of Systems All review of systems negative except what is listed in the HPI     Objective:    Physical Exam Vitals reviewed.  Constitutional:      Appearance: Normal appearance.  Musculoskeletal:        General: No swelling.  Skin:    General: Skin is warm.     Findings: No rash.     Comments: Small scar/resolving papule to right shoulder and left groin, no rash, inflammation, erythema, warmth  Neurological:     General: No focal deficit present.     Mental Status: She is alert and oriented to person, place, and time. Mental status is at baseline.     Motor: No weakness.  Psychiatric:        Mood and Affect: Mood normal.        Behavior: Behavior normal.        Thought Content: Thought content normal.        Judgment: Judgment normal.    There were no vitals taken for this visit. Wt Readings from Last 3 Encounters:  05/06/20 158 lb (71.7 kg)  04/02/20 158 lb (71.7 kg)  09/19/19 154 lb (69.9 kg)    Health Maintenance Due  Topic Date Due   Zoster Vaccines- Shingrix (1 of 2) Never done   FOOT EXAM  12/13/2019   HEMOGLOBIN A1C  03/21/2020    There are no preventive care reminders to display for this patient.   Lab Results  Component Value Date   TSH 3.58 09/09/2018   Lab  Results   Component Value Date   WBC 8.1 09/09/2018   HGB 14.0 09/09/2018   HCT 42.6 09/09/2018   MCV 90.3 09/09/2018   PLT 266 09/09/2018   Lab Results  Component Value Date   NA 139 09/19/2019   K 4.2 09/19/2019   CO2 29 09/19/2019   GLUCOSE 122 (H) 09/19/2019   BUN 19 09/19/2019   CREATININE 0.85 09/19/2019   BILITOT 0.8 09/19/2019   ALKPHOS 59 07/12/2015   AST 24 09/19/2019   ALT 20 09/19/2019   PROT 6.8 09/19/2019   ALBUMIN 4.3 07/12/2015   CALCIUM 8.9 09/19/2019   Lab Results  Component Value Date   CHOL 117 09/19/2019   Lab Results  Component Value Date   HDL 42 (L) 09/19/2019   Lab Results  Component Value Date   LDLCALC 55 09/19/2019   Lab Results  Component Value Date   TRIG 117 09/19/2019   Lab Results  Component Value Date   CHOLHDL 2.8 09/19/2019   Lab Results  Component Value Date   HGBA1C 5.8 (H) 09/19/2019       Assessment & Plan:   1. Tick bite of right shoulder, initial encounter 2. Tick bite of groin, initial encounter Both tick bites have resolved nicely with no symptoms or timeline concerning for Lyme disease. Discussed with patient and any further testing deferred at this time. Educated patient on ticks, removal, signs and symptoms to observe after finding a tick attached, follow-up recommendations, etc. All questions answered. No concerns.  Follow-up as needed.   Terrilyn Saver, NP

## 2020-09-04 ENCOUNTER — Encounter: Payer: Self-pay | Admitting: Family Medicine

## 2020-09-04 DIAGNOSIS — Z0000 Encounter for general adult medical examination without abnormal findings: Secondary | ICD-10-CM

## 2020-09-04 DIAGNOSIS — E785 Hyperlipidemia, unspecified: Secondary | ICD-10-CM

## 2020-09-04 DIAGNOSIS — E119 Type 2 diabetes mellitus without complications: Secondary | ICD-10-CM

## 2020-09-20 ENCOUNTER — Encounter: Payer: Self-pay | Admitting: Family Medicine

## 2020-09-20 ENCOUNTER — Ambulatory Visit (INDEPENDENT_AMBULATORY_CARE_PROVIDER_SITE_OTHER): Payer: BC Managed Care – PPO | Admitting: Family Medicine

## 2020-09-20 VITALS — BP 125/55 | HR 66 | Temp 98.5°F | Ht 62.0 in | Wt 156.0 lb

## 2020-09-20 DIAGNOSIS — E119 Type 2 diabetes mellitus without complications: Secondary | ICD-10-CM | POA: Diagnosis not present

## 2020-09-20 DIAGNOSIS — I10 Essential (primary) hypertension: Secondary | ICD-10-CM

## 2020-09-20 LAB — CBC WITH DIFFERENTIAL/PLATELET
Absolute Monocytes: 776 cells/uL (ref 200–950)
Basophils Absolute: 56 cells/uL (ref 0–200)
Basophils Relative: 0.7 %
Eosinophils Absolute: 568 cells/uL — ABNORMAL HIGH (ref 15–500)
Eosinophils Relative: 7.1 %
HCT: 38.2 % (ref 35.0–45.0)
Hemoglobin: 12.8 g/dL (ref 11.7–15.5)
Lymphs Abs: 2136 cells/uL (ref 850–3900)
MCH: 29.8 pg (ref 27.0–33.0)
MCHC: 33.5 g/dL (ref 32.0–36.0)
MCV: 89 fL (ref 80.0–100.0)
MPV: 11.1 fL (ref 7.5–12.5)
Monocytes Relative: 9.7 %
Neutro Abs: 4464 cells/uL (ref 1500–7800)
Neutrophils Relative %: 55.8 %
Platelets: 271 10*3/uL (ref 140–400)
RBC: 4.29 10*6/uL (ref 3.80–5.10)
RDW: 13.9 % (ref 11.0–15.0)
Total Lymphocyte: 26.7 %
WBC: 8 10*3/uL (ref 3.8–10.8)

## 2020-09-20 LAB — HEMOGLOBIN A1C
Hgb A1c MFr Bld: 7.6 % of total Hgb — ABNORMAL HIGH (ref <5.7)
Mean Plasma Glucose: 171 mg/dL
eAG (mmol/L): 9.5 mmol/L

## 2020-09-20 LAB — COMPLETE METABOLIC PANEL WITHOUT GFR
AG Ratio: 1.5 (calc) (ref 1.0–2.5)
ALT: 30 U/L — ABNORMAL HIGH (ref 6–29)
AST: 27 U/L (ref 10–35)
Albumin: 3.9 g/dL (ref 3.6–5.1)
Alkaline phosphatase (APISO): 51 U/L (ref 37–153)
BUN/Creatinine Ratio: 32 (calc) — ABNORMAL HIGH (ref 6–22)
BUN: 29 mg/dL — ABNORMAL HIGH (ref 7–25)
CO2: 27 mmol/L (ref 20–32)
Calcium: 8.9 mg/dL (ref 8.6–10.4)
Chloride: 105 mmol/L (ref 98–110)
Creat: 0.91 mg/dL (ref 0.50–1.05)
Globulin: 2.6 g/dL (ref 1.9–3.7)
Glucose, Bld: 177 mg/dL — ABNORMAL HIGH (ref 65–99)
Potassium: 4.1 mmol/L (ref 3.5–5.3)
Sodium: 139 mmol/L (ref 135–146)
Total Bilirubin: 0.5 mg/dL (ref 0.2–1.2)
Total Protein: 6.5 g/dL (ref 6.1–8.1)
eGFR: 70 mL/min/1.73m2

## 2020-09-20 LAB — POCT UA - MICROALBUMIN
Albumin/Creatinine Ratio, Urine, POC: <30
Creatinine, POC: 200 mg/dL
Microalbumin Ur, POC: 10 mg/L

## 2020-09-20 LAB — LIPID PANEL W/REFLEX DIRECT LDL
Cholesterol: 150 mg/dL
HDL: 42 mg/dL — ABNORMAL LOW
LDL Cholesterol (Calc): 84 mg/dL
Non-HDL Cholesterol (Calc): 108 mg/dL
Total CHOL/HDL Ratio: 3.6 (calc)
Triglycerides: 140 mg/dL

## 2020-09-20 NOTE — Assessment & Plan Note (Signed)
Unfortunately her A1c jumped significantly but she reports she really has been stress eating.  We discussed options including work really putting some focus back on to diet and activity level or considering medication she really wants to work on her diet and plan to follow back up in 3 months.  Lab Results  Component Value Date   HGBA1C 7.6 (H) 09/19/2020

## 2020-09-20 NOTE — Progress Notes (Signed)
Subjective:     Erin Hill is a 65 y.o. adult and is here for a comprehensive physical ex  am. The patient reports no problems.  She has been doing okay its been a bit of a struggle to deal with her husband's cancer diagnosis and really trying to understand what is going on but yet not be able to go to his appointments because she still needs to work.  That has definitely been a bit of a struggle.  But no recent chest pain shortness of breath or other systemic symptoms.  Did have her labs done yesterday and did have a question about the eosinophils.  Social History   Socioeconomic History   Marital status: Married    Spouse name: Theron Arista   Number of children: Not on file   Years of education: Not on file   Highest education level: Not on file  Occupational History   Occupation: Printmaker: [employer]  Tobacco Use   Smoking status: Never   Smokeless tobacco: Never  Substance and Sexual Activity   Alcohol use: No   Drug use: No   Sexual activity: Yes    Partners: Male  Other Topics Concern   Not on file  Social History Narrative   No regular exercise.     Social Determinants of Health   Financial Resource Strain: Not on file  Food Insecurity: Not on file  Transportation Needs: Not on file  Physical Activity: Not on file  Stress: Not on file  Social Connections: Not on file  Intimate Partner Violence: Not on file   Health Maintenance  Topic Date Due   Zoster Vaccines- Shingrix (1 of 2) Never done   FOOT EXAM  12/13/2019   INFLUENZA VACCINE  09/16/2020   OPHTHALMOLOGY EXAM  12/28/2020   HEMOGLOBIN A1C  03/22/2021   MAMMOGRAM  11/16/2021   PAP SMEAR-Modifier  09/18/2024   TETANUS/TDAP  07/11/2025   COLONOSCOPY (Pts 45-78yrs Insurance coverage will need to be confirmed)  10/24/2028   COVID-19 Vaccine  Completed   Hepatitis C Screening  Completed   HIV Screening  Completed   Pneumococcal Vaccine 65-45 Years old  Aged Out   HPV VACCINES  Aged Out     The following portions of the patient's history were reviewed and updated as appropriate: allergies, current medications, past family history, past medical history, past social history, past surgical history, and problem list.  Review of Systems Pertinent items are noted in HPI.   Objective:    BP (!) 125/55   Pulse 66   Temp 98.5 F (36.9 C) (Oral)   Ht 5\' 2"  (1.575 m)   Wt 156 lb (70.8 kg)   SpO2 100% Comment: on RA  BMI 28.53 kg/m  General appearance: alert, cooperative, and appears stated age Head: Normocephalic, without obvious abnormality, atraumatic Eyes:  conj clear, EOMI, PEERLA Ears: normal TM's and external ear canals both ears Nose: Nares normal. Septum midline. Mucosa normal. No drainage or sinus tenderness. Throat: lips, mucosa, and tongue normal; teeth and gums normal Neck: no adenopathy, no carotid bruit, no JVD, supple, symmetrical, trachea midline, and thyroid not enlarged, symmetric, no tenderness/mass/nodules Back: symmetric, no curvature. ROM normal. No CVA tenderness. Lungs: clear to auscultation bilaterally Breasts: normal appearance, no masses or tenderness Heart: regular rate and rhythm, S1, S2 normal, no murmur, click, rub or gallop Abdomen: soft, non-tender; bowel sounds normal; no masses,  no organomegaly Extremities: extremities normal, atraumatic, no cyanosis or edema Pulses: 2+  and symmetric Skin: Skin color, texture, turgor normal. No rashes or lesions Lymph nodes: Cervical, supraclavicular, and axillary nodes normal. Neurologic: Grossly normal    Assessment:    Healthy adult exam.      Plan:     See After Visit Summary for Counseling Recommendations   Keep up a regular exercise program and make sure you are eating a healthy diet Try to eat 4 servings of dairy a day, or if you are lactose intolerant take a calcium with vitamin D daily.  Your vaccines are up to date.  Labs done.  Eosinophils just slightly elevated most consistent with  allergies.  New onset type 2 diabetes mellitus (HCC) Unfortunately her A1c jumped significantly but she reports she really has been stress eating.  We discussed options including work really putting some focus back on to diet and activity level or considering medication she really wants to work on her diet and plan to follow back up in 3 months.  Lab Results  Component Value Date   HGBA1C 7.6 (H) 09/19/2020

## 2020-09-30 ENCOUNTER — Encounter: Payer: Self-pay | Admitting: Family Medicine

## 2020-09-30 ENCOUNTER — Telehealth: Payer: Self-pay

## 2020-09-30 NOTE — Telephone Encounter (Signed)
Form completed. Placed in [place] [facility] [employer]

## 2020-09-30 NOTE — Telephone Encounter (Signed)
Patient's husband dropped a folder with a piece of paper in it for Dr. Linford Arnold to fill out for medication assistance. Placed paperwork in message box- tvt

## 2020-10-01 NOTE — Telephone Encounter (Signed)
Forms given to Erin Hill V. To give to patient

## 2020-10-01 NOTE — Telephone Encounter (Signed)
Called patient to let her know that paperwork was completed and she stated that they would be by here today to pick it up- tvt

## 2020-12-12 DIAGNOSIS — H918X9 Other specified hearing loss, unspecified ear: Secondary | ICD-10-CM | POA: Insufficient documentation

## 2020-12-20 ENCOUNTER — Other Ambulatory Visit: Payer: Self-pay

## 2020-12-20 ENCOUNTER — Ambulatory Visit (INDEPENDENT_AMBULATORY_CARE_PROVIDER_SITE_OTHER): Payer: BC Managed Care – PPO | Admitting: Family Medicine

## 2020-12-20 ENCOUNTER — Encounter: Payer: Self-pay | Admitting: Family Medicine

## 2020-12-20 VITALS — BP 128/45 | HR 71 | Ht 62.0 in | Wt 158.0 lb

## 2020-12-20 DIAGNOSIS — E1169 Type 2 diabetes mellitus with other specified complication: Secondary | ICD-10-CM

## 2020-12-20 DIAGNOSIS — I10 Essential (primary) hypertension: Secondary | ICD-10-CM

## 2020-12-20 DIAGNOSIS — E119 Type 2 diabetes mellitus without complications: Secondary | ICD-10-CM | POA: Diagnosis not present

## 2020-12-20 DIAGNOSIS — Z23 Encounter for immunization: Secondary | ICD-10-CM

## 2020-12-20 LAB — POCT GLYCOSYLATED HEMOGLOBIN (HGB A1C): Hemoglobin A1C: 6.7 % — AB (ref 4.0–5.6)

## 2020-12-20 NOTE — Progress Notes (Signed)
Established Patient Office Visit  Subjective:  Patient ID: Erin Hill, adult    DOB: Feb 18, 1955  Age: 65 y.o. MRN: [id number]  CC:  Chief Complaint  Patient presents with   Diabetes   Hypertension    HPI Erin Hill presents for   Hypertension- Pt denies chest pain, SOB, dizziness, or heart palpitations.  Taking meds as directed w/o problems.  Denies medication side effects.    Diabetes - no hypoglycemic events. No wounds or sores that are not healing well. No increased thirst or urination. Checking glucose at home. Taking medications as prescribed without any side effects.  She is really just struggling with eating when she feels stressed in the last 3 weeks have been incredibly stressful.  Her husband who is undergoing cancer treatment was not doing well and was really sick and she found herself eating a lot of sweets and snacks.   Past Medical History:  Diagnosis Date   Atrial flutter ([place])    2015   Hearing loss    chronic   Miscarriage    X 2   Proteinuria    chronic (not related to DM)   Rheumatic heart disease    s/p aortic/ mitral valve replace    Past Surgical History:  Procedure Laterality Date   double heart valve replacement  1999   echo EF 75-80%     SAB     X 2   stapes surgery, left  2003   then removed 2 years later   TAB      X 1   TONSILLECTOMY  as a child    Family History  Problem Relation Age of Onset   Diabetes Father    Lung cancer Mother        smoked until her 3's   Hypertension Mother    Diabetes Father    Hyperlipidemia Father    Hypertension Father     Social History   Socioeconomic History   Marital status: Married    Spouse name: Collier Salina   Number of children: Not on file   Years of education: Not on file   Highest education level: Not on file  Occupational History   Occupation: Retail banker: [employer]  Tobacco Use   Smoking status: Never   Smokeless tobacco: Never  Substance and Sexual  Activity   Alcohol use: No   Drug use: No   Sexual activity: Yes    Partners: Male  Other Topics Concern   Not on file  Social History Narrative   No regular exercise.     Social Determinants of Health   Financial Resource Strain: Not on file  Food Insecurity: Not on file  Transportation Needs: Not on file  Physical Activity: Not on file  Stress: Not on file  Social Connections: Not on file  Intimate Partner Violence: Not on file    Outpatient Medications Prior to Visit  Medication Sig Dispense Refill   ACCU-CHEK GUIDE test strip USE TO CHECK BLOOD SUGAR TWICE DAILY. DX: R73.01 200 strip 6   b complex vitamins tablet Take 1 tablet by mouth daily.     Blood Glucose Monitoring Suppl (ACCU-CHEK AVIVA PLUS) w/Device KIT Use to check blood sugar twice daily. Dx: R73.01 1 kit PRN   clobetasol cream (TEMOVATE) 3.15 % Apply 1 application topically 2 (two) times daily as needed. 30 g 0   Coenzyme Q-10 100 MG capsule Take 300 mg by mouth daily.  Lancets (ACCU-CHEK MULTICLIX) lancets Use to check blood sugar twice daily. Dx: R73.01 100 each 12   metoprolol succinate (TOPROL-XL) 100 MG 24 hr tablet TAKE 1 TABLET BY MOUTH DAILY WITH OR IMMEDIATELY FOLLOWING A MEAL. 90 tablet 1   Olmesartan-amLODIPine-HCTZ 40-5-25 MG TABS TAKE 1 TABLET BY MOUTH EVERY DAY 90 tablet 1   OVER THE COUNTER MEDICATION Super C ( vit D, Vit D, zinc)     rosuvastatin (CRESTOR) 10 MG tablet TAKE 1.5 TABLETS BY MOUTH ONCE A DAY 135 tablet 3   Vitamin K, Phytonadione, 100 MCG TABS Take 100 mcg daily by mouth.     warfarin (COUMADIN) 5 MG tablet TAKE 1 TABLET (5 MG) BY MOUTH DAILY EXCEPT FOR MON/FRI TAKE 1/2 TABLET (2.$RemoveBefor'5MG'yDSunlnsaImc$ ) OR AS DIRECTED BY COUMADIN CLINIC.     JANUVIA 25 MG tablet TAKE ONE TABLET BY MOUTH DAILY (Patient not taking: Reported on 09/20/2020) 90 tablet 2   No facility-administered medications prior to visit.    Allergies  Allergen Reactions   Erythromycin     REACTION: renal toxicity   Gemfibrozil  Other (See Comments)    Acid reflux   Shellfish Allergy     Other reaction(s): Other [place] all over her body   Shellfish-Derived Products Hives    Other reaction(s): Other Whelps all over her body Whelps all over her body   Strawberry Extract Rash    Hives Hives   Versed [Midazolam]     REACTION: rash, fever   Iodinated Diagnostic Agents Rash    Fever Fever   Iodine Rash    Contrast media   Lipitor [Atorvastatin] Nausea Only    Stomach pain   Metformin And Related Other (See Comments)    Nausea, loose stools, fatigue    Protonix [Pantoprazole Sodium] Other (See Comments)    Nause, fatigue, muscle cramping.    Strawberry (Diagnostic) Rash    Hives    ROS Review of Systems    Objective:    Physical Exam Constitutional:      Appearance: Normal appearance. She is well-developed.  HENT:     Head: Normocephalic and atraumatic.  Cardiovascular:     Rate and Rhythm: Normal rate and regular rhythm.     Heart sounds: Normal heart sounds.     Comments: 5/6 SEM Pulmonary:     Effort: Pulmonary effort is normal.     Breath sounds: Normal breath sounds.  Skin:    General: Skin is warm and dry.  Neurological:     Mental Status: She is alert and oriented to person, place, and time. Mental status is at baseline.  Psychiatric:        Behavior: Behavior normal.   BP (!) 128/45   Pulse 71   Ht $R'5\' 2"'wS$  (1.575 m)   Wt 158 lb (71.7 kg)   SpO2 98%   BMI 28.90 kg/m  Wt Readings from Last 3 Encounters:  12/20/20 158 lb (71.7 kg)  09/20/20 156 lb (70.8 kg)  05/06/20 158 lb (71.7 kg)     Health Maintenance Due  Topic Date Due   Zoster Vaccines- Shingrix (1 of 2) Never done   Pneumococcal Vaccine 39-42 Years old (2 - PCV) 12/29/2006   COVID-19 Vaccine (5 - Booster for Pfizer series) 10/05/2020    There are no preventive care reminders to display for this patient.  Lab Results  Component Value Date   TSH 3.58 09/09/2018   Lab Results  Component Value Date   WBC 8.0  09/19/2020  HGB 12.8 09/19/2020   HCT 38.2 09/19/2020   MCV 89.0 09/19/2020   PLT 271 09/19/2020   Lab Results  Component Value Date   NA 139 09/19/2020   K 4.1 09/19/2020   CO2 27 09/19/2020   GLUCOSE 177 (H) 09/19/2020   BUN 29 (H) 09/19/2020   CREATININE 0.91 09/19/2020   BILITOT 0.5 09/19/2020   ALKPHOS 59 07/12/2015   AST 27 09/19/2020   ALT 30 (H) 09/19/2020   PROT 6.5 09/19/2020   ALBUMIN 4.3 07/12/2015   CALCIUM 8.9 09/19/2020   EGFR 70 09/19/2020   Lab Results  Component Value Date   CHOL 150 09/19/2020   Lab Results  Component Value Date   HDL 42 (L) 09/19/2020   Lab Results  Component Value Date   LDLCALC 84 09/19/2020   Lab Results  Component Value Date   TRIG 140 09/19/2020   Lab Results  Component Value Date   CHOLHDL 3.6 09/19/2020   Lab Results  Component Value Date   HGBA1C 6.7 (A) 12/20/2020      Assessment & Plan:   Problem List Items Addressed This Visit       Cardiovascular and Mediastinum   Essential hypertension    Well controlled. Continue current regimen. Follow up in  3-4 mo       Relevant Orders   POCT glycosylated hemoglobin (Hb A1C) (Completed)     Endocrine   Type 2 diabetes mellitus with other specified complication (HCC) - Primary    A1c actually looks much better today at 6.7.  That she has been eating a lot of sweets in the last few weeks.  We did discuss considering switching to a GLP-1 instead and discussed how those medications work might be more effective than the [place] for her.  I gave her the names of the medications she wants to do a little research on her own and then let me know if she would like to switch or not.  Otherwise plan to follow-up in about 3 months continue to work on healthy food choices, regular exercise.      Other Visit Diagnoses     Need for immunization against influenza       Relevant Orders   Flu Vaccine QUAD 61mo+IM (Fluarix, Fluzone & Alfiuria Quad PF) (Completed)       No  orders of the defined types were placed in this encounter.   Follow-up: Return in about 3 months (around 03/24/2021) for Diabetes follow-up.    Beatrice Lecher, MD

## 2020-12-20 NOTE — Assessment & Plan Note (Signed)
Well controlled. Continue current regimen. Follow up in  3-4 mo  

## 2020-12-20 NOTE — Patient Instructions (Signed)
We could consider Victoza which is daily, or one of the weekly injectables which includes Trulicity, Ozempic.   Rybelsus.

## 2020-12-20 NOTE — Assessment & Plan Note (Signed)
A1c actually looks much better today at 6.7.  That she has been eating a lot of sweets in the last few weeks.  We did discuss considering switching to a GLP-1 instead and discussed how those medications work might be more effective than the Januvia for her.  I gave her the names of the medications she wants to do a little research on her own and then let me know if she would like to switch or not.  Otherwise plan to follow-up in about 3 months continue to work on healthy food choices, regular exercise.

## 2021-01-03 LAB — HM MAMMOGRAPHY

## 2021-02-22 ENCOUNTER — Other Ambulatory Visit: Payer: Self-pay | Admitting: Family Medicine

## 2021-03-28 ENCOUNTER — Ambulatory Visit: Payer: BC Managed Care – PPO | Admitting: Family Medicine

## 2021-04-09 ENCOUNTER — Telehealth: Payer: Self-pay | Admitting: Family Medicine

## 2021-04-09 NOTE — Telephone Encounter (Signed)
Unfortunately, her husband Theron Arista passed away a couple days ago who is also my patient.  Just called to offer condolences and let her know that if she needs any support please let us know.  Her sister is currently with her.  Unfortunately he was getting better but just suddenly took a turn for the worst and they did not feel like he would do well if he were intubated.

## 2021-04-28 ENCOUNTER — Other Ambulatory Visit: Payer: Self-pay | Admitting: Family Medicine

## 2021-04-28 DIAGNOSIS — I10 Essential (primary) hypertension: Secondary | ICD-10-CM

## 2021-07-18 ENCOUNTER — Other Ambulatory Visit: Payer: Self-pay | Admitting: Family Medicine

## 2021-07-18 DIAGNOSIS — I10 Essential (primary) hypertension: Secondary | ICD-10-CM

## 2021-07-25 ENCOUNTER — Ambulatory Visit (INDEPENDENT_AMBULATORY_CARE_PROVIDER_SITE_OTHER): Payer: Medicare Other | Admitting: Sports Medicine

## 2021-07-25 DIAGNOSIS — M25512 Pain in left shoulder: Secondary | ICD-10-CM | POA: Diagnosis not present

## 2021-07-25 DIAGNOSIS — G8929 Other chronic pain: Secondary | ICD-10-CM

## 2021-07-25 NOTE — Progress Notes (Signed)
    Procedures performed today:    None.  Independent interpretation of notes and tests performed by another provider:   None.  Brief History, Exam, Impression, and Recommendations:    Chronic left shoulder pain Pleasant 66 year old female, she has had over 3 months of left shoulder pain over the deltoid with radiation down the arm usually to the elbow, worse with abduction, external rotation. She does have significant weakness particularly to abduction, positive impingement signs and glenohumeral signs. She did have an x-ray that was unrevealing. She has failed greater than 6 weeks of conservative treatment so we will proceed with MRI. I did explain the importance of physical therapy for rotator cuff tears. She is on Coumadin for heart valve replacement, so surgery would certainly be a last resort. Injections are a possibility in the future as well. She was having some pain periscapular and past the elbow so explained that this was likely coming from the cervical spine and we will put this on the back burner and evaluate this once her shoulder MRI was done. Return to see me to go over shoulder MRI results. She would like to wait until seeing the results before ordering things like physical therapy.    ___________________________________________ Ihor Austin. Benjamin Stain, M.D., ABFM., CAQSM. Primary Care and Sports Medicine [facility]  Adjunct Instructor of Family Medicine  [employer]

## 2021-07-25 NOTE — Assessment & Plan Note (Signed)
Pleasant 66 year old female, she has had over 3 months of left shoulder pain over the deltoid with radiation down the arm usually to the elbow, worse with abduction, external rotation. She does have significant weakness particularly to abduction, positive impingement signs and glenohumeral signs. She did have an x-ray that was unrevealing. She has failed greater than 6 weeks of conservative treatment so we will proceed with MRI. I did explain the importance of physical therapy for rotator cuff tears. She is on Coumadin for heart valve replacement, so surgery would certainly be a last resort. Injections are a possibility in the future as well. She was having some pain periscapular and past the elbow so explained that this was likely coming from the cervical spine and we will put this on the back burner and evaluate this once her shoulder MRI was done. Return to see me to go over shoulder MRI results. She would like to wait until seeing the results before ordering things like physical therapy.

## 2021-08-01 ENCOUNTER — Other Ambulatory Visit: Payer: Self-pay | Admitting: Family Medicine

## 2021-08-03 ENCOUNTER — Ambulatory Visit: Payer: Medicare Other

## 2021-08-05 ENCOUNTER — Telehealth: Payer: Self-pay

## 2021-08-05 MED ORDER — TRIAZOLAM 0.25 MG PO TABS: ORAL_TABLET | ORAL | 0 refills | Status: DC

## 2021-08-05 NOTE — Telephone Encounter (Signed)
Patient called stating she attempted to have her MRI yesterday but was unable to complete it due to having too much pain and nausea. She was advised to contact you to see about being medicated prior to trying to do another scan.

## 2021-08-05 NOTE — Telephone Encounter (Signed)
Patient aware of recommendations/restrictions.

## 2021-08-05 NOTE — Telephone Encounter (Signed)
Sending off triazolam, she will need a driver.

## 2021-08-07 ENCOUNTER — Ambulatory Visit: Payer: BLUE CROSS/BLUE SHIELD | Admitting: Family Medicine

## 2021-08-09 ENCOUNTER — Ambulatory Visit (INDEPENDENT_AMBULATORY_CARE_PROVIDER_SITE_OTHER): Payer: Medicare Other

## 2021-08-09 DIAGNOSIS — M25512 Pain in left shoulder: Secondary | ICD-10-CM

## 2021-08-09 DIAGNOSIS — G8929 Other chronic pain: Secondary | ICD-10-CM | POA: Diagnosis not present

## 2021-08-22 ENCOUNTER — Other Ambulatory Visit: Payer: Self-pay | Admitting: Family Medicine

## 2021-08-22 NOTE — Telephone Encounter (Signed)
Please advised pt and advise her that she is overdue for OV.  Refill sent

## 2021-08-22 NOTE — Telephone Encounter (Signed)
Patient has been scheduled for 10/01/21. AMUCK

## 2021-08-26 ENCOUNTER — Ambulatory Visit (INDEPENDENT_AMBULATORY_CARE_PROVIDER_SITE_OTHER): Payer: Medicare Other | Admitting: Sports Medicine

## 2021-08-26 DIAGNOSIS — M25512 Pain in left shoulder: Secondary | ICD-10-CM | POA: Diagnosis not present

## 2021-08-26 DIAGNOSIS — G8929 Other chronic pain: Secondary | ICD-10-CM

## 2021-08-26 NOTE — Assessment & Plan Note (Signed)
This is a very pleasant 66 year old female, chronic left shoulder pain, ultimately we obtained an MRI that showed some partial tearing of the supraspinatus. She is happy to do formal physical therapy so we will set this up for 6 weeks. We can do an injection if not better after that. Surgery would be a last resort considering her Coumadin treatment for heart valve replacement. Return to see me in 6 weeks.

## 2021-08-26 NOTE — Progress Notes (Signed)
    Procedures performed today:    None.  Independent interpretation of notes and tests performed by another provider:   None.  Brief History, Exam, Impression, and Recommendations:    Chronic left shoulder pain This is a very pleasant 66 year old female, chronic left shoulder pain, ultimately we obtained an MRI that showed some partial tearing of the supraspinatus. She is happy to do formal physical therapy so we will set this up for 6 weeks. We can do an injection if not better after that. Surgery would be a last resort considering her Coumadin treatment for heart valve replacement. Return to see me in 6 weeks.    ____________________________________________ Ihor Austin. Benjamin Stain, M.D., ABFM., CAQSM., AME. Primary Care and Sports Medicine [facility]  Adjunct Professor of Family Medicine  [employer]  Restaurant manager, fast food

## 2021-08-27 ENCOUNTER — Other Ambulatory Visit: Payer: Self-pay

## 2021-08-27 ENCOUNTER — Ambulatory Visit: Payer: Medicare Other | Attending: Sports Medicine | Admitting: Rehabilitative and Restorative Service Providers"

## 2021-08-27 ENCOUNTER — Encounter: Payer: Self-pay | Admitting: Rehabilitative and Restorative Service Providers"

## 2021-08-27 DIAGNOSIS — R29898 Other symptoms and signs involving the musculoskeletal system: Secondary | ICD-10-CM | POA: Insufficient documentation

## 2021-08-27 DIAGNOSIS — M25512 Pain in left shoulder: Secondary | ICD-10-CM | POA: Insufficient documentation

## 2021-08-27 DIAGNOSIS — R293 Abnormal posture: Secondary | ICD-10-CM | POA: Diagnosis not present

## 2021-08-27 DIAGNOSIS — G8929 Other chronic pain: Secondary | ICD-10-CM | POA: Diagnosis not present

## 2021-08-27 NOTE — Patient Instructions (Signed)
Access Code: [email or web address] URL: [email or web address] Date: 08/27/2021 Prepared by: Corlis Leak  Exercises - Seated Cervical Retraction  - 3 x daily - 7 x weekly - 1 sets - 10 reps - Standing Scapular Retraction  - 3 x daily - 7 x weekly - 1 sets - 10 reps - 10 hold - Shoulder External Rotation and Scapular Retraction  - 3 x daily - 7 x weekly - 1 sets - 10 reps -   hold - Shoulder External Rotation in 45 Degrees Abduction  - 2 x daily - 7 x weekly - 1-2 sets - 10 reps - 3 sec  hold - Doorway Pec Stretch at 60 Degrees Abduction  - 3 x daily - 7 x weekly - 1 sets - 3 reps - Doorway Pec Stretch at 90 Degrees Abduction  - 3 x daily - 7 x weekly - 1 sets - 3 reps - 30 seconds  hold - Doorway Pec Stretch at 120 Degrees Abduction  - 3 x daily - 7 x weekly - 1 sets - 3 reps - 30 second hold  hold - Seated Shoulder Flexion AAROM with Pulley Behind  - 2 x daily - 7 x weekly - 1 sets - 10 reps - 10 sec  hold - Seated Shoulder Scaption AAROM with Pulley at Side  - 2 x daily - 7 x weekly - 1 sets - 10 reps - 10sec  hold - Supine Chest Stretch on Foam Roll  - 2 x daily - 7 x weekly - 1 sets - 1 reps - 2-5 min  sec  hold  Patient Education - TENS Unit - Posture and Body Mechanics

## 2021-08-27 NOTE — Therapy (Signed)
[facility] [place] [address] [place], [place], [address] Phone: [phone]   Fax:  [phone]  Physical Therapy Evaluation  Rationale for Evaluation and Treatment Rehabilitation  Patient Details  Name: Erin Hill MRN: [id number] Date of Birth: 1955-05-24 Referring Provider (PT): Dr Benjamin Stain   Encounter Date: 08/27/2021   PT End of Session - 08/27/21 1551     Visit Number 1    Number of Visits 16    Date for PT Re-Evaluation 10/22/21    PT Start Time 1450    PT Stop Time 1552    PT Time Calculation (min) 62 min    Activity Tolerance Patient tolerated treatment well             Past Medical History:  Diagnosis Date   Atrial flutter (HCC)    2015   Hearing loss    chronic   Miscarriage    X 2   Proteinuria    chronic (not related to DM)   Rheumatic heart disease    s/p aortic/ mitral valve replace    Past Surgical History:  Procedure Laterality Date   double heart valve replacement  1999   echo EF 75-80%     SAB     X 2   stapes surgery, left  2003   then removed 2 years later   TAB      X 1   TONSILLECTOMY  as a child    There were no vitals filed for this visit.    Subjective Assessment - 08/27/21 1453     Subjective Patient reports Lt shoulder pain over the past 6-7 months with no known njury. She sleeps on her Lt side with the Lt UE under the pillow and up. Pain was intense and limited her from dressing and using her arm. She was trying to move the Lt UE to keep it from getting tight. Now having pain in the top of the shoulder and occasionally pain down the Lt arm to her mid arm. Pain is now improving. She is having massages every 2 weeks and working on movement. The shoulder feels tight at the deltoid.    Pertinent History arthritis; OHS 40 yrs ago; AODM; HTN    Patient Stated Goals get rid of the shoulder pain and increase ROM    Currently in Pain? Yes    Pain Score 4    with elevation  Lt UE; no pain at rest   Pain Location Shoulder    Pain Orientation Left    Pain Descriptors / Indicators Sore    Pain Type Chronic pain    Pain Radiating Towards at times to deltiod; to forearm    Pain Onset More than a month ago    Pain Frequency Intermittent    Aggravating Factors  lifting arm; lifting    Pain Relieving Factors massage; OTC meds                [facility] PT Assessment - 08/27/21 0001       Assessment   Medical Diagnosis Chronic Lt shoulder pain    Referring Provider (PT) Dr Benjamin Stain    Onset Date/Surgical Date 11/16/21    Hand Dominance Right    Next MD Visit 10/07/21    Prior Therapy none      Precautions   Precautions None      Restrictions   Weight Bearing Restrictions No      Balance Screen   Has the patient  fallen in the past 6 months No    Has the patient had a decrease in activity level because of a fear of falling?  No    Is the patient reluctant to leave their home because of a fear of falling?  No      Home Tourist information centre manager residence      Prior Function   Level of Independence Independent    Vocation Part time employment;Other (comment)    Vocation Requirements self employed massage therapist    Leisure household chores; Sport and exercise psychologist; crafts; time with friends      Observation/Other Assessments   Focus on Therapeutic Outcomes (FOTO)  52      Sensation   Additional Comments WFL's per pt report      Posture/Postural Control   Posture Comments head forward; shoulders rounded and elewvated; head of the humerus anterior in orientation      AROM   Right Shoulder Extension 43 Degrees    Right Shoulder Flexion 148 Degrees    Right Shoulder ABduction 157 Degrees    Right Shoulder Internal Rotation --   thumb T 7/8   Right Shoulder External Rotation 82 Degrees   shoulder 90/elbow 90   Left Shoulder Extension 30 Degrees    Left Shoulder Flexion 120 Degrees    Left Shoulder ABduction 118 Degrees    Left  Shoulder Internal Rotation --   hand to lateral hip   Left Shoulder External Rotation 34 Degrees   shoulder 90/elbow 90     Palpation   Spinal mobility hypomobility thoracic and cervical spine with PA mobs    Palpation comment muscular tightness Lt > Rt pecs; upper trap; leveator; biceps; deltoid                        Objective measurements completed on examination: See above findings.       [facility] Adult PT Treatment/Exercise - 08/27/21 0001       Self-Care   Self-Care Other Self-Care Comments;Posture    Posture working on posture and alignment in sitting and standing    Other Self-Care Comments  use of noodle for exercise and posture      Therapeutic Activites    Other Therapeutic Activities myofacial ball release work standing      Neuro Re-ed    Neuro Re-ed Details  postural correction      Shoulder Exercises: Standing   Other Standing Exercises axial extension 10 sec x 5; scap squeeze 10 sec x 5; L's x 10; W's x 10 noodle along spine      Shoulder Exercises: Pulleys   Flexion Limitations 10 reps x 10 sec hold    Scaption Limitations 10 reps x 10 sec hold      Shoulder Exercises: Stretch   Star Gazer Stretch 1 rep;60 seconds   supine arms to side resting on table Lt ~ 40 deg abduction   Other Shoulder Stretches doorway 3 positions placing hands on doorway not stepping in 30 sec x 1 rep each - tightest mid      Moist Heat Therapy   Number Minutes Moist Heat 10 Minutes    Moist Heat Location Shoulder      Electrical Stimulation   Electrical Stimulation Location Lt shoulder girdle    Electrical Stimulation Action TENS    Electrical Stimulation Parameters to tolerance    Electrical Stimulation Goals Pain;Tone  PT Education - 08/27/21 1551     Education Details POC HEP posture and alignment; myofacial ball release    Person(s) Educated Patient    Methods Explanation;Demonstration;Tactile cues;Verbal cues;Handout     Comprehension Verbalized understanding;Returned demonstration;Verbal cues required;Tactile cues required                 PT Long Term Goals - 08/27/21 1556       PT LONG TERM GOAL #1   Title Decrease Lt shoulder pain by 75-100% with elevation and functional activities    Time 8    Period Weeks    Status New    Target Date 10/22/21      PT LONG TERM GOAL #2   Title Improve posture and alignment with patient to demonstrate improved upright posture with posterior shoudler girdle engaged    Time 8    Period Weeks    Status New    Target Date 10/22/21      PT LONG TERM GOAL #3   Title Increase AROM Lt shoulder to +/> than AROM Rt shoulder    Time 8    Period Weeks    Status New      PT LONG TERM GOAL #4   Title Independnt in HEP    Time 8    Period Weeks    Status New    Target Date 10/22/21      PT LONG TERM GOAL #5   Title Iprove functional limitation score to 65    Time 8    Period Weeks    Status New    Target Date 10/22/21                    Plan - 08/27/21 1552     Clinical Impression Statement Patient presents with ~ 7 month history of Lt shoulder pain with no known injury. She had severe pain limiting movement and functional use of Lt UE. Symptoms have improved some in the past few weeks with movement and massage. Patient continues to have limited ROM, pain with elevation of Lt UE; poor posture and alignment; decreased functional activity level; muscular tightness and pain with palpation. 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 Patient also has a history of OHS for valve replacement.    Stability/Clinical Decision Making Stable/Uncomplicated    Clinical Decision Making Low    Rehab Potential Good    PT Frequency 2x / week    PT Duration 8 weeks    PT Treatment/Interventions ADLs/Self Care Home Management;Aquatic Therapy;Cryotherapy;Electrical Stimulation;Iontophoresis 4mg /ml Dexamethasone;Moist Heat;Ultrasound;Functional mobility  training;Therapeutic activities;Therapeutic exercise;Neuromuscular re-education;Patient/family education;Manual techniques;Passive range of motion;Dry needling;Taping    PT Next Visit Plan review and progress HEP; continue work on posture and alignment; add manual work including DN as indicated; PROM; stretching; posterior shoudler girdle strengthening; modalities as indicated    PT Home Exercise Plan    Consulted and Agree with Plan of Care Patient             Patient will benefit from skilled therapeutic intervention in order to improve the following deficits and impairments:  Decreased range of motion, Impaired UE functional use, Decreased activity tolerance, Pain, Hypomobility, Impaired flexibility, Improper body mechanics, Decreased mobility, Decreased strength, Postural dysfunction  Visit Diagnosis: Chronic left shoulder pain  Abnormal posture  Other symptoms and signs involving the musculoskeletal system     Problem List Patient Active Problem List   Diagnosis Date Noted   Chronic left  shoulder pain 07/25/2021   Asymmetrical hearing loss 12/12/2020   Type 2 diabetes mellitus with other specified complication (HCC) 07/21/2017   Chest pain 07/20/2017   Diastolic dysfunction 06/12/2015   Metacarpophalangeal joint pain of right hand 06/05/2013   PVC's (premature ventricular contractions) 06/05/2013   Pulmonary hypertension (HCC) 03/09/2013   Atrial flutter with rapid ventricular response (HCC) 02/23/2013   Trigger finger, acquired 08/02/2012   Mitral valve disease 10/13/2011   Allergy to IVP dye 08/27/2011   Long term current use of anticoagulant 08/27/2011   Normal coronary arteries 08/27/2011   Rheumatic heart disease 08/27/2011   MITRAL VALVE REPLACEMENT, HX OF 08/05/2006   Aortic valve replaced 08/05/2006   Hyperlipidemia 11/24/2005   Essential hypertension 11/24/2005    Zamya Culhane Rober Minion, PT, MPH  08/27/2021, 4:05 PM  [facility] [address] [place], [place], [address] Phone: [phone]   Fax:  [phone]  Name: Erin Hill MRN: [id number] Date of Birth: 1956/01/22

## 2021-08-27 NOTE — Addendum Note (Signed)
Addended by: Val Riles on: 08/27/2021 04:06 PM   Modules accepted: Orders

## 2021-09-03 ENCOUNTER — Ambulatory Visit: Payer: Medicare Other | Admitting: Rehabilitative and Restorative Service Providers"

## 2021-09-03 ENCOUNTER — Encounter: Payer: Self-pay | Admitting: Rehabilitative and Restorative Service Providers"

## 2021-09-03 DIAGNOSIS — R293 Abnormal posture: Secondary | ICD-10-CM

## 2021-09-03 DIAGNOSIS — G8929 Other chronic pain: Secondary | ICD-10-CM | POA: Diagnosis not present

## 2021-09-03 DIAGNOSIS — R29898 Other symptoms and signs involving the musculoskeletal system: Secondary | ICD-10-CM

## 2021-09-03 NOTE — Therapy (Signed)
[facility] [place] [address] [place], [place], [address] Phone: [phone]   Fax:  [phone]  Physical Therapy Treatment Rationale for Evaluation and Treatment Rehabilitation  Patient Details  Name: Erin Hill MRN: [id number] Date of Birth: 10-13-1955 Referring Provider (PT): Dr Benjamin Stain   Encounter Date: 09/03/2021   PT End of Session - 09/03/21 0931     Visit Number 2    Number of Visits 16    Date for PT Re-Evaluation 10/22/21    PT Start Time 0930    PT Stop Time 1018    PT Time Calculation (min) 48 min    Activity Tolerance Patient tolerated treatment well             Past Medical History:  Diagnosis Date   Atrial flutter (HCC)    2015   Hearing loss    chronic   Miscarriage    X 2   Proteinuria    chronic (not related to DM)   Rheumatic heart disease    s/p aortic/ mitral valve replace    Past Surgical History:  Procedure Laterality Date   double heart valve replacement  1999   echo EF 75-80%     SAB     X 2   stapes surgery, left  2003   then removed 2 years later   TAB      X 1   TONSILLECTOMY  as a child    There were no vitals filed for this visit.   Subjective Assessment - 09/03/21 0931     Subjective Patient reports that she has been working on her exercises at home. Her sister sent her a pulley for home. Pain has not changed. Same intensity has improved slightly.    Currently in Pain? Yes    Pain Score 0-No pain   with lifting arm overhead 8/10 for ~ 2 min   Pain Location Shoulder    Pain Orientation Left    Pain Descriptors / Indicators Sore                               [facility] Adult PT Treatment/Exercise - 09/03/21 0001       Shoulder Exercises: Standing   Other Standing Exercises axial extension 10 sec x 5; scap squeeze 10 sec x 5; L's x 10; W's x 10 noodle along spine      Shoulder Exercises: Pulleys   Flexion Limitations 10 reps x 10 sec  hold    Scaption Limitations 10 reps x 10 sec hold    Other Pulley Exercises shoulder horizontal ab/add with pulley x 10 reps      Shoulder Exercises: ROM/Strengthening   Other ROM/Strengthening Exercises trunk rotation to Rt 10-15 sec hold x 3 reps      Shoulder Exercises: Stretch   Star Gazer Stretch 1 rep;60 seconds   supine arms to side resting on table Lt ~ 40 deg abduction   Other Shoulder Stretches doorway 3 positions placing hands on doorway not stepping in 30 sec x 2 rep each - tightest mid      Moist Heat Therapy   Number Minutes Moist Heat 10 Minutes    Moist Heat Location Shoulder      Electrical Stimulation   Electrical Stimulation Location Lt shoulder girdle    Electrical Stimulation Action TENs    Electrical Stimulation Parameters to tolerance    Electrical Stimulation Goals Pain;Tone  Manual Therapy   Joint Mobilization Grade I/II through [facility] joint in inferior and posterior planes    Soft tissue mobilization deep tissue work through the Lt shoulder girdle anterior shoulder/pecs; upper trap; leveator; biceps    Myofascial Release anterior chest    Passive ROM Lt shoulder flexion; horizontal abduction; extension    Other Manual Therapy manula traction through the long arm post treatment                     PT Education - 09/03/21 1016     Education Details HEP    Person(s) Educated Patient    Methods Explanation;Demonstration;Tactile cues;Verbal cues;Handout    Comprehension Verbalized understanding;Returned demonstration;Verbal cues required;Tactile cues required                 PT Long Term Goals - 08/27/21 1556       PT LONG TERM GOAL #1   Title Decrease Lt shoulder pain by 75-100% with elevation and functional activities    Time 8    Period Weeks    Status New    Target Date 10/22/21      PT LONG TERM GOAL #2   Title Improve posture and alignment with patient to demonstrate improved upright posture with posterior shoudler girdle  engaged    Time 8    Period Weeks    Status New    Target Date 10/22/21      PT LONG TERM GOAL #3   Title Increase AROM Lt shoulder to +/> than AROM Rt shoulder    Time 8    Period Weeks    Status New      PT LONG TERM GOAL #4   Title Independnt in HEP    Time 8    Period Weeks    Status New    Target Date 10/22/21      PT LONG TERM GOAL #5   Title Iprove functional limitation score to 65    Time 8    Period Weeks    Status New    Target Date 10/22/21                   Plan - 09/03/21 0936     Clinical Impression Statement Patient is working on her exercises at home. She continues to have pain at about the same level. Does not want to try DN due to being on blood thinner following OHS for heart valve surgery. Trial of manual work and PROM Lt shoulder. Good gains in PROM post treatment and pt more tolerant of Lt UE lying flat on table at ~ 45 deg    Rehab Potential Good    PT Frequency 2x / week    PT Duration 8 weeks    PT Treatment/Interventions ADLs/Self Care Home Management;Aquatic Therapy;Cryotherapy;Electrical Stimulation;Iontophoresis 4mg /ml Dexamethasone;Moist Heat;Ultrasound;Functional mobility training;Therapeutic activities;Therapeutic exercise;Neuromuscular re-education;Patient/family education;Manual techniques;Passive range of motion;Dry needling;Taping    PT Next Visit Plan review and progress HEP; continue work on posture and alignment; add manual work including DN as indicated; PROM; stretching; posterior shoudler girdle strengthening; modalities as indicated    PT Home Exercise Plan [id number]    Consulted and Agree with Plan of Care Patient             Patient will benefit from skilled therapeutic intervention in order to improve the following deficits and impairments:     Visit Diagnosis: Chronic left shoulder pain  Abnormal posture  Other symptoms and signs involving the musculoskeletal  system     Problem List Patient Active Problem  List   Diagnosis Date Noted   Chronic left shoulder pain 07/25/2021   Asymmetrical hearing loss 12/12/2020   Type 2 diabetes mellitus with other specified complication (HCC) 07/21/2017   Chest pain 07/20/2017   Diastolic dysfunction 06/12/2015   Metacarpophalangeal joint pain of right hand 06/05/2013   PVC's (premature ventricular contractions) 06/05/2013   Pulmonary hypertension (HCC) 03/09/2013   Atrial flutter with rapid ventricular response (HCC) 02/23/2013   Trigger finger, acquired 08/02/2012   Mitral valve disease 10/13/2011   Allergy to IVP dye 08/27/2011   Long term current use of anticoagulant 08/27/2011   Normal coronary arteries 08/27/2011   Rheumatic heart disease 08/27/2011   MITRAL VALVE REPLACEMENT, HX OF 08/05/2006   Aortic valve replaced 08/05/2006   Hyperlipidemia 11/24/2005   Essential hypertension 11/24/2005    Orion Vandervort Rober Minion, PT, MPH  09/03/2021, 10:19 AM  [facility] [address] [place], [place], [address] Phone: [phone]   Fax:  [phone]  Name: Erin Hill MRN: [id number] Date of Birth: [birth year]

## 2021-09-03 NOTE — Patient Instructions (Signed)
Access Code: [email or web address] URL: [email or web address] Date: 09/03/2021 Prepared by: Corlis Leak  Exercises - Seated Cervical Retraction  - 3 x daily - 7 x weekly - 1 sets - 10 reps - Standing Scapular Retraction  - 3 x daily - 7 x weekly - 1 sets - 10 reps - 10 hold - Shoulder External Rotation and Scapular Retraction  - 3 x daily - 7 x weekly - 1 sets - 10 reps -   hold - Shoulder External Rotation in 45 Degrees Abduction  - 2 x daily - 7 x weekly - 1-2 sets - 10 reps - 3 sec  hold - Doorway Pec Stretch at 60 Degrees Abduction  - 3 x daily - 7 x weekly - 1 sets - 3 reps - Doorway Pec Stretch at 90 Degrees Abduction  - 3 x daily - 7 x weekly - 1 sets - 3 reps - 30 seconds  hold - Doorway Pec Stretch at 120 Degrees Abduction  - 3 x daily - 7 x weekly - 1 sets - 3 reps - 30 second hold  hold - Seated Shoulder Flexion AAROM with Pulley Behind  - 2 x daily - 7 x weekly - 1 sets - 10 reps - 10 sec  hold - Seated Shoulder Scaption AAROM with Pulley at Side  - 2 x daily - 7 x weekly - 1 sets - 10 reps - 10sec  hold - Supine Chest Stretch on Foam Roll  - 2 x daily - 7 x weekly - 1 sets - 1 reps - 2-5 min  sec  hold - Supine Lower Trunk Rotation  - 2 x daily - 7 x weekly - 1 sets - 3-5 reps - 20-30 sec  hold

## 2021-09-09 ENCOUNTER — Encounter: Payer: Self-pay | Admitting: Rehabilitative and Restorative Service Providers"

## 2021-09-09 ENCOUNTER — Ambulatory Visit: Payer: Medicare Other | Admitting: Rehabilitative and Restorative Service Providers"

## 2021-09-09 DIAGNOSIS — G8929 Other chronic pain: Secondary | ICD-10-CM | POA: Diagnosis not present

## 2021-09-09 DIAGNOSIS — R29898 Other symptoms and signs involving the musculoskeletal system: Secondary | ICD-10-CM

## 2021-09-09 DIAGNOSIS — R293 Abnormal posture: Secondary | ICD-10-CM

## 2021-09-09 NOTE — Therapy (Signed)
[facility] [address] [place], [place], [address] Phone: [phone]   Fax:  [phone]  Physical Therapy Treatment Rationale for Evaluation and Treatment Rehabilitation  Patient Details  Name: Erin Hill MRN: [id number] Date of Birth: [birth year] Referring Provider (PT): Dr Benjamin Stain   Encounter Date: 09/09/2021   PT End of Session - 09/09/21 1058     Visit Number 3    Number of Visits 16    Date for PT Re-Evaluation 10/22/21    PT Start Time 1059    PT Stop Time 1147    PT Time Calculation (min) 48 min    Activity Tolerance Patient tolerated treatment well             Past Medical History:  Diagnosis Date   Atrial flutter (HCC)    2015   Hearing loss    chronic   Miscarriage    X 2   Proteinuria    chronic (not related to DM)   Rheumatic heart disease    s/p aortic/ mitral valve replace    Past Surgical History:  Procedure Laterality Date   double heart valve replacement  1999   echo EF 75-80%     SAB     X 2   stapes surgery, left  2003   then removed 2 years later   TAB      X 1   TONSILLECTOMY  as a child    There were no vitals filed for this visit.   Subjective Assessment - 09/09/21 1059     Subjective Patient reports that the shoulder is feeling better overall. She is having less pain and can reach across body better. She is sleeping better at night. Working her exercises but not every day.    Currently in Pain? No/denies    Pain Score 0-No pain   5/10 with movement   Pain Location Shoulder    Pain Orientation Left    Pain Descriptors / Indicators Sore    Pain Type Chronic pain    Pain Radiating Towards at times to dletoid; no pain down her arm now    Pain Onset More than a month ago    Pain Frequency Intermittent    Aggravating Factors  lifting arm; lifting    Pain Relieving Factors massage; OTC meds - but less meds now                                [facility] Adult PT Treatment/Exercise - 09/09/21 0001       Shoulder Exercises: Standing   Row Strengthening;Both;20 reps;Theraband    Theraband Level (Shoulder Row) Level 2 (Red)    Row Limitations bow and arrow x 10 each side red TB    Retraction Strengthening;Both;20 reps;Theraband    Theraband Level (Shoulder Retraction) Level 1 (Yellow)    Other Standing Exercises axial extension 10 sec x 5; scap squeeze 10 sec x 5; L's x 10; W's x 10 noodle along spine      Shoulder Exercises: Pulleys   Flexion Limitations 10 reps x 10 sec hold    Scaption Limitations 10 reps x 10 sec hold    Other Pulley Exercises shoulder horizontal ab/add with pulley x 10 reps      Shoulder Exercises: ROM/Strengthening   Other ROM/Strengthening Exercises trunk rotation to Rt 10-15 sec hold x 3 reps      Shoulder Exercises: Stretch  Star Gazer Stretch 1 rep;60 seconds   supine arms to side resting on table Lt ~ 40 deg abduction   Other Shoulder Stretches doorway 3 positions placing hands on doorway not stepping in 30 sec x 2 rep each - tightest mid    Other Shoulder Stretches biceps stretch 20 sec x 3 reps      Cryotherapy   Number Minutes Cryotherapy 10 Minutes    Cryotherapy Location Shoulder    Type of Cryotherapy Ice pack      Manual Therapy   Joint Mobilization Grade I/II through [facility] joint in inferior and posterior planes    Soft tissue mobilization deep tissue work through the Lt shoulder girdle anterior shoulder/pecs; upper trap; leveator; biceps    Myofascial Release anterior chest    Passive ROM Lt shoulder flexion; horizontal abduction; extension    Other Manual Therapy manual traction through the long arm post treatment                     PT Education - 09/09/21 1121     Education Details HEP    Person(s) Educated Patient    Methods Explanation;Demonstration;Tactile cues;Verbal cues;Handout    Comprehension Verbalized understanding;Returned  demonstration;Verbal cues required;Tactile cues required                 PT Long Term Goals - 08/27/21 1556       PT LONG TERM GOAL #1   Title Decrease Lt shoulder pain by 75-100% with elevation and functional activities    Time 8    Period Weeks    Status New    Target Date 10/22/21      PT LONG TERM GOAL #2   Title Improve posture and alignment with patient to demonstrate improved upright posture with posterior shoudler girdle engaged    Time 8    Period Weeks    Status New    Target Date 10/22/21      PT LONG TERM GOAL #3   Title Increase AROM Lt shoulder to +/> than AROM Rt shoulder    Time 8    Period Weeks    Status New      PT LONG TERM GOAL #4   Title Independnt in HEP    Time 8    Period Weeks    Status New    Target Date 10/22/21      PT LONG TERM GOAL #5   Title Iprove functional limitation score to 65    Time 8    Period Weeks    Status New    Target Date 10/22/21                   Plan - 09/09/21 1103     Clinical Impression Statement Good progress with less pain and improved functional activities using Lt UE. Continued progress with P/AROM Lt shoulder. Added resistive exercises for home. Issued theraband for home. Continued with PROM/stretch/joint mobs.    Rehab Potential Good    PT Frequency 2x / week    PT Duration 8 weeks    PT Treatment/Interventions ADLs/Self Care Home Management;Aquatic Therapy;Cryotherapy;Electrical Stimulation;Iontophoresis 4mg /ml Dexamethasone;Moist Heat;Ultrasound;Functional mobility training;Therapeutic activities;Therapeutic exercise;Neuromuscular re-education;Patient/family education;Manual techniques;Passive range of motion;Dry needling;Taping    PT Next Visit Plan review and progress HEP; continue work on posture and alignment; continue manual work (pt does not want to try DN); continue PROM; stretching; posterior shoulder girdle strengthening; modalities as indicated    PT Home Exercise Plan  Consulted and Agree with Plan of Care Patient             Patient will benefit from skilled therapeutic intervention in order to improve the following deficits and impairments:     Visit Diagnosis: Chronic left shoulder pain  Abnormal posture  Other symptoms and signs involving the musculoskeletal system     Problem List Patient Active Problem List   Diagnosis Date Noted   Chronic left shoulder pain 07/25/2021   Asymmetrical hearing loss 12/12/2020   Type 2 diabetes mellitus with other specified complication (HCC) 07/21/2017   Chest pain 07/20/2017   Diastolic dysfunction 06/12/2015   Metacarpophalangeal joint pain of right hand 06/05/2013   PVC's (premature ventricular contractions) 06/05/2013   Pulmonary hypertension (HCC) 03/09/2013   Atrial flutter with rapid ventricular response (HCC) 02/23/2013   Trigger finger, acquired 08/02/2012   Mitral valve disease 10/13/2011   Allergy to IVP dye 08/27/2011   Long term current use of anticoagulant 08/27/2011   Normal coronary arteries 08/27/2011   Rheumatic heart disease 08/27/2011   MITRAL VALVE REPLACEMENT, HX OF 08/05/2006   Aortic valve replaced 08/05/2006   Hyperlipidemia 11/24/2005   Essential hypertension 11/24/2005    Deontrae Drinkard Rober Minion, PT, MPH  09/09/2021, 11:46 AM  [facility] [address] [place], [place], [address] Phone: [phone]   Fax:  [phone]  Name: Erin Hill MRN: [id number] Date of Birth: 1955/02/23

## 2021-09-09 NOTE — Patient Instructions (Signed)
Access Code: [email or web address] URL: [email or web address] Date: 09/09/2021 Prepared by: Corlis Leak  Exercises - Seated Cervical Retraction  - 3 x daily - 7 x weekly - 1 sets - 10 reps - Standing Scapular Retraction  - 3 x daily - 7 x weekly - 1 sets - 10 reps - 10 hold - Shoulder External Rotation and Scapular Retraction  - 3 x daily - 7 x weekly - 1 sets - 10 reps -   hold - Shoulder External Rotation in 45 Degrees Abduction  - 2 x daily - 7 x weekly - 1-2 sets - 10 reps - 3 sec  hold - Doorway Pec Stretch at 60 Degrees Abduction  - 3 x daily - 7 x weekly - 1 sets - 3 reps - Doorway Pec Stretch at 90 Degrees Abduction  - 3 x daily - 7 x weekly - 1 sets - 3 reps - 30 seconds  hold - Doorway Pec Stretch at 120 Degrees Abduction  - 3 x daily - 7 x weekly - 1 sets - 3 reps - 30 second hold  hold - Seated Shoulder Flexion AAROM with Pulley Behind  - 2 x daily - 7 x weekly - 1 sets - 10 reps - 10 sec  hold - Seated Shoulder Scaption AAROM with Pulley at Side  - 2 x daily - 7 x weekly - 1 sets - 10 reps - 10sec  hold - Supine Chest Stretch on Foam Roll  - 2 x daily - 7 x weekly - 1 sets - 1 reps - 2-5 min  sec  hold - Supine Lower Trunk Rotation  - 2 x daily - 7 x weekly - 1 sets - 3-5 reps - 20-30 sec  hold - Prone Scapular Retraction  - 1 x daily - 7 x weekly - 1-3 sets - 10 reps - 5-10 sec  hold - Shoulder External Rotation and Scapular Retraction with Resistance  - 3-4 x daily - 7 x weekly - 2-3 sets - 10 reps - 3-5 sec  hold - Standing Bilateral Low Shoulder Row with Anchored Resistance  - 2 x daily - 7 x weekly - 1-3 sets - 10 reps - 2-3 sec  hold - Drawing Bow  - 1 x daily - 7 x weekly - 1 sets - 10 reps - 3 sec  hold - Bicep Stretch at Table  - 2 x daily - 7 x weekly - 1 sets - 3 reps - 30 sec  hold

## 2021-09-11 ENCOUNTER — Ambulatory Visit: Payer: Medicare Other | Admitting: Rehabilitative and Restorative Service Providers"

## 2021-09-11 ENCOUNTER — Encounter: Payer: Self-pay | Admitting: Rehabilitative and Restorative Service Providers"

## 2021-09-11 DIAGNOSIS — R293 Abnormal posture: Secondary | ICD-10-CM

## 2021-09-11 DIAGNOSIS — R29898 Other symptoms and signs involving the musculoskeletal system: Secondary | ICD-10-CM

## 2021-09-11 DIAGNOSIS — G8929 Other chronic pain: Secondary | ICD-10-CM | POA: Diagnosis not present

## 2021-09-11 NOTE — Therapy (Signed)
[facility] [place] [address] [place], [place], [address] Phone: [phone]   Fax:  [phone]  Physical Therapy Treatment Rationale for Evaluation and Treatment Rehabilitation  Patient Details  Name: Erin Hill MRN: [id number] Date of Birth: 1955/07/11 Referring Provider (PT): Dr Benjamin Stain   Encounter Date: 09/11/2021   PT End of Session - 09/11/21 0943     Visit Number 4    Number of Visits 16    Date for PT Re-Evaluation 10/22/21    PT Start Time 0933    PT Stop Time 1022    PT Time Calculation (min) 49 min    Activity Tolerance Patient tolerated treatment well             Past Medical History:  Diagnosis Date   Atrial flutter (HCC)    2015   Hearing loss    chronic   Miscarriage    X 2   Proteinuria    chronic (not related to DM)   Rheumatic heart disease    s/p aortic/ mitral valve replace    Past Surgical History:  Procedure Laterality Date   double heart valve replacement  1999   echo EF 75-80%     SAB     X 2   stapes surgery, left  2003   then removed 2 years later   TAB      X 1   TONSILLECTOMY  as a child    There were no vitals filed for this visit.   Subjective Assessment - 09/11/21 0945     Subjective Doing ok - has increased pain with pulley and doorway stretch. No longer taking OTC meds and is sleeping without awakening due to pain.    Currently in Pain? No/denies    Pain Score 0-No pain    Pain Location Shoulder    Pain Orientation Left    Pain Descriptors / Indicators Sore                               [facility] Adult PT Treatment/Exercise - 09/11/21 0001       Shoulder Exercises: Standing   Row Strengthening;Both;20 reps;Theraband    Theraband Level (Shoulder Row) Level 2 (Red);Level 3 (Green)    Row Limitations bow and arrow x 10 each side green TB    Retraction Strengthening;Both;20 reps;Theraband    Theraband Level (Shoulder Retraction) Level  1 (Yellow)    Other Standing Exercises axial extension 10 sec x 5; scap squeeze 10 sec x 5; L's x 10; W's x 10 noodle along spine      Shoulder Exercises: ROM/Strengthening   Other ROM/Strengthening Exercises trunk rotation to Rt 10-15 sec hold x 3 reps      Shoulder Exercises: Stretch   Star Gazer Stretch 1 rep;60 seconds   supine arms to side resting on table Lt ~ 40 deg abduction   Other Shoulder Stretches doorway 3 positions placing hands on doorway not stepping in 30 sec x 2 rep each - tightest mid    Other Shoulder Stretches biceps stretch 20 sec x 3 reps      Cryotherapy   Number Minutes Cryotherapy 10 Minutes    Cryotherapy Location Shoulder    Type of Cryotherapy Ice pack      Manual Therapy   Joint Mobilization Grade I/II through [facility] joint in inferior and posterior planes    Soft tissue mobilization deep tissue work through  the Lt shoulder girdle anterior shoulder/pecs; upper trap; leveator; biceps; SCM    Myofascial Release anterior chest    Passive ROM Lt shoulder flexion; horizontal abduction; extension    Other Manual Therapy manual traction through the long arm post treatment                          PT Long Term Goals - 08/27/21 1556       PT LONG TERM GOAL #1   Title Decrease Lt shoulder pain by 75-100% with elevation and functional activities    Time 8    Period Weeks    Status New    Target Date 10/22/21      PT LONG TERM GOAL #2   Title Improve posture and alignment with patient to demonstrate improved upright posture with posterior shoudler girdle engaged    Time 8    Period Weeks    Status New    Target Date 10/22/21      PT LONG TERM GOAL #3   Title Increase AROM Lt shoulder to +/> than AROM Rt shoulder    Time 8    Period Weeks    Status New      PT LONG TERM GOAL #4   Title Independnt in HEP    Time 8    Period Weeks    Status New    Target Date 10/22/21      PT LONG TERM GOAL #5   Title Iprove functional limitation score  to 65    Time 8    Period Weeks    Status New    Target Date 10/22/21                   Plan - 09/11/21 0943     Clinical Impression Statement Patient is sleeping well and no longer thaking medication. Has increased pain after stretching with pulley or doorway. TB exercises are going well. Some increased pain and tightness in the anterior chest into the SCM on Lt noted today. Progressing gradually toward stated goals.    Rehab Potential Good    PT Frequency 2x / week    PT Duration 8 weeks    PT Treatment/Interventions ADLs/Self Care Home Management;Aquatic Therapy;Cryotherapy;Electrical Stimulation;Iontophoresis 4mg /ml Dexamethasone;Moist Heat;Ultrasound;Functional mobility training;Therapeutic activities;Therapeutic exercise;Neuromuscular re-education;Patient/family education;Manual techniques;Passive range of motion;Dry needling;Taping    PT Next Visit Plan review and progress HEP; continue work on posture and alignment; continue manual work (pt does not want to try DN); continue PROM; stretching; posterior shoulder girdle strengthening; modalities as indicated    PT Home Exercise Plan             Patient will benefit from skilled therapeutic intervention in order to improve the following deficits and impairments:     Visit Diagnosis: Chronic left shoulder pain  Abnormal posture  Other symptoms and signs involving the musculoskeletal system     Problem List Patient Active Problem List   Diagnosis Date Noted   Chronic left shoulder pain 07/25/2021   Asymmetrical hearing loss 12/12/2020   Type 2 diabetes mellitus with other specified complication (HCC) 07/21/2017   Chest pain 07/20/2017   Diastolic dysfunction 06/12/2015   Metacarpophalangeal joint pain of right hand 06/05/2013   PVC's (premature ventricular contractions) 06/05/2013   Pulmonary hypertension (HCC) 03/09/2013   Atrial flutter with rapid ventricular response (HCC) 02/23/2013   Trigger  finger, acquired 08/02/2012   Mitral valve disease 10/13/2011   Allergy to  IVP dye 08/27/2011   Long term current use of anticoagulant 08/27/2011   Normal coronary arteries 08/27/2011   Rheumatic heart disease 08/27/2011   MITRAL VALVE REPLACEMENT, HX OF 08/05/2006   Aortic valve replaced 08/05/2006   Hyperlipidemia 11/24/2005   Essential hypertension 11/24/2005    Talmadge Ganas Rober Minion, PT, MPH  09/11/2021, 10:11 AM  [facility] [address] [place], [place], [address] Phone: [phone]   Fax:  [phone]  Name: KEBRA LOWRIMORE MRN: [id number] Date of Birth: 1955/06/03

## 2021-09-16 ENCOUNTER — Encounter: Payer: Self-pay | Admitting: Rehabilitative and Restorative Service Providers"

## 2021-09-16 ENCOUNTER — Ambulatory Visit: Payer: Medicare Other | Attending: Sports Medicine | Admitting: Rehabilitative and Restorative Service Providers"

## 2021-09-16 DIAGNOSIS — M25512 Pain in left shoulder: Secondary | ICD-10-CM | POA: Insufficient documentation

## 2021-09-16 DIAGNOSIS — G8929 Other chronic pain: Secondary | ICD-10-CM | POA: Insufficient documentation

## 2021-09-16 DIAGNOSIS — R29898 Other symptoms and signs involving the musculoskeletal system: Secondary | ICD-10-CM | POA: Diagnosis present

## 2021-09-16 DIAGNOSIS — R293 Abnormal posture: Secondary | ICD-10-CM | POA: Diagnosis present

## 2021-09-16 NOTE — Patient Instructions (Signed)
Access Code: [email or web address] URL: [email or web address] Date: 09/16/2021 Prepared by: Corlis Leak  Exercises - Seated Cervical Retraction  - 3 x daily - 7 x weekly - 1 sets - 10 reps - Standing Scapular Retraction  - 3 x daily - 7 x weekly - 1 sets - 10 reps - 10 hold - Shoulder External Rotation and Scapular Retraction  - 3 x daily - 7 x weekly - 1 sets - 10 reps -   hold - Shoulder External Rotation in 45 Degrees Abduction  - 2 x daily - 7 x weekly - 1-2 sets - 10 reps - 3 sec  hold - Doorway Pec Stretch at 60 Degrees Abduction  - 3 x daily - 7 x weekly - 1 sets - 3 reps - Doorway Pec Stretch at 90 Degrees Abduction  - 3 x daily - 7 x weekly - 1 sets - 3 reps - 30 seconds  hold - Doorway Pec Stretch at 120 Degrees Abduction  - 3 x daily - 7 x weekly - 1 sets - 3 reps - 30 second hold  hold - Seated Shoulder Flexion AAROM with Pulley Behind  - 2 x daily - 7 x weekly - 1 sets - 10 reps - 10 sec  hold - Seated Shoulder Scaption AAROM with Pulley at Side  - 2 x daily - 7 x weekly - 1 sets - 10 reps - 10sec  hold - Supine Chest Stretch on Foam Roll  - 2 x daily - 7 x weekly - 1 sets - 1 reps - 2-5 min  sec  hold - Supine Lower Trunk Rotation  - 2 x daily - 7 x weekly - 1 sets - 3-5 reps - 20-30 sec  hold - Prone Scapular Retraction  - 1 x daily - 7 x weekly - 1-3 sets - 10 reps - 5-10 sec  hold - Shoulder External Rotation and Scapular Retraction with Resistance  - 3-4 x daily - 7 x weekly - 2-3 sets - 10 reps - 3-5 sec  hold - Standing Bilateral Low Shoulder Row with Anchored Resistance  - 2 x daily - 7 x weekly - 1-3 sets - 10 reps - 2-3 sec  hold - Drawing Bow  - 1 x daily - 7 x weekly - 1 sets - 10 reps - 3 sec  hold - Bicep Stretch at Table  - 2 x daily - 7 x weekly - 1 sets - 3 reps - 30 sec  hold - Shoulder W - External Rotation with Resistance  - 2 x daily - 7 x weekly - 1-2 sets - 10 reps - 3 sec  hold - Shoulder extension with resistance - Neutral  - 1 x daily - 7 x weekly - 1-2  sets - 10 reps - 3-5 sec  hold - Standing Overhead Shoulder External Rotation Stretch with Towel  - 2 x daily - 7 x weekly - 1 sets - 3-5 reps - 10-30 sec  hold - Shoulder ER Stretch in Abduction  - 2 x daily - 7 x weekly - 1 sets - 3 reps - 30 sec  hold - Standing Thoracic Open Book at Wall  - 2 x daily - 7 x weekly - 1 sets - 3 reps - 30 sec  hold

## 2021-09-16 NOTE — Therapy (Signed)
[facility] [place] [address] [place], [place], [address] Phone: [phone]   Fax:  [phone]  Physical Therapy Treatment Rationale for Evaluation and Treatment Rehabilitation  Patient Details  Name: Erin Hill MRN: [id number] Date of Birth: [birth year] Referring Provider (PT): Dr Benjamin Stain   Encounter Date: 09/16/2021   PT End of Session - 09/16/21 1407     Visit Number 5    Number of Visits 16    Date for PT Re-Evaluation 10/22/21    PT Start Time 1400    PT Stop Time 1448    PT Time Calculation (min) 48 min    Activity Tolerance Patient tolerated treatment well             Past Medical History:  Diagnosis Date   Atrial flutter (HCC)    2015   Hearing loss    chronic   Miscarriage    X 2   Proteinuria    chronic (not related to DM)   Rheumatic heart disease    s/p aortic/ mitral valve replace    Past Surgical History:  Procedure Laterality Date   double heart valve replacement  1999   echo EF 75-80%     SAB     X 2   stapes surgery, left  2003   then removed 2 years later   TAB      X 1   TONSILLECTOMY  as a child    There were no vitals filed for this visit.   Subjective Assessment - 09/16/21 1412     Subjective Shoulder is feeling better. Can tell she is making progress. Exercises are going well.    Currently in Pain? No/denies    Pain Score 0-No pain    Pain Location Shoulder    Pain Orientation Left                               [facility] Adult PT Treatment/Exercise - 09/16/21 0001       Shoulder Exercises: Standing   Extension Strengthening;Both;10 reps;Theraband    Theraband Level (Shoulder Extension) Level 3 (Green)    Row Strengthening;Both;20 reps;Theraband    Theraband Level (Shoulder Row) Level 3 (Green)    Row Limitations bow and arrow x 10 each side green TB    Retraction Strengthening;Both;20 reps;Theraband    Theraband Level (Shoulder Retraction)  Level 1 (Yellow)    Retraction Limitations W red TB x 10    Other Standing Exercises axial extension 10 sec x 5; scap squeeze 10 sec x 5; L's x 10; W's x 10 noodle along spine      Shoulder Exercises: Pulleys   Flexion Limitations 10 reps x 10 sec hold    Scaption Limitations 10 reps x 10 sec hold    Other Pulley Exercises shoulder horizontal ab/add with pulley x 10 reps      Shoulder Exercises: ROM/Strengthening   Other ROM/Strengthening Exercises trunk rotation to Rt 10-15 sec hold x 3 reps      Shoulder Exercises: Stretch   Wall Stretch - ABduction Limitations horizontal abduction stretch 20 sec x 3 reps    Star Gazer Stretch 1 rep;60 seconds   supine arms to side resting on table Lt ~ 40 deg abduction   Other Shoulder Stretches doorway 3 positions placing hands on doorway not stepping in 30 sec x 2 rep each - tightest mid    Other Shoulder Stretches  biceps stretch 20 sec x 3 reps      Manual Therapy   Joint Mobilization Grade I/II through [facility] joint in inferior and posterior planes    Soft tissue mobilization deep tissue work through the Lt shoulder girdle anterior shoulder/pecs; upper trap; leveator; biceps; SCM    Myofascial Release anterior chest    Passive ROM Lt shoulder flexion; horizontal abduction; extension    Other Manual Therapy manual traction through the long arm post treatment                     PT Education - 09/16/21 1428     Education Details HEP    Person(s) Educated Patient    Methods Explanation;Demonstration;Tactile cues;Verbal cues;Handout    Comprehension Verbalized understanding;Returned demonstration;Verbal cues required;Tactile cues required                 PT Long Term Goals - 08/27/21 1556       PT LONG TERM GOAL #1   Title Decrease Lt shoulder pain by 75-100% with elevation and functional activities    Time 8    Period Weeks    Status New    Target Date 10/22/21      PT LONG TERM GOAL #2   Title Improve posture and  alignment with patient to demonstrate improved upright posture with posterior shoudler girdle engaged    Time 8    Period Weeks    Status New    Target Date 10/22/21      PT LONG TERM GOAL #3   Title Increase AROM Lt shoulder to +/> than AROM Rt shoulder    Time 8    Period Weeks    Status New      PT LONG TERM GOAL #4   Title Independnt in HEP    Time 8    Period Weeks    Status New    Target Date 10/22/21      PT LONG TERM GOAL #5   Title Iprove functional limitation score to 65    Time 8    Period Weeks    Status New    Target Date 10/22/21                   Plan - 09/16/21 1413     Clinical Impression Statement Continued progress with shoulder rehab. Decreased pain and increased mobilty and ROM Lt shoulder. Progressing with resistive exercises.    Rehab Potential Good    PT Frequency 2x / week    PT Duration 8 weeks    PT Treatment/Interventions ADLs/Self Care Home Management;Aquatic Therapy;Cryotherapy;Electrical Stimulation;Iontophoresis 4mg /ml Dexamethasone;Moist Heat;Ultrasound;Functional mobility training;Therapeutic activities;Therapeutic exercise;Neuromuscular re-education;Patient/family education;Manual techniques;Passive range of motion;Dry needling;Taping    PT Next Visit Plan review and progress HEP; continue work on posture and alignment; continue manual work (pt does not want to try DN); continue PROM; stretching; posterior shoulder girdle strengthening; modalities as indicated    PT Home Exercise Plan    Consulted and Agree with Plan of Care Patient             Patient will benefit from skilled therapeutic intervention in order to improve the following deficits and impairments:     Visit Diagnosis: Chronic left shoulder pain  Abnormal posture  Other symptoms and signs involving the musculoskeletal system     Problem List Patient Active Problem List   Diagnosis Date Noted   Chronic left shoulder pain 07/25/2021    Asymmetrical hearing loss 12/12/2020  Type 2 diabetes mellitus with other specified complication (HCC) 07/21/2017   Chest pain 07/20/2017   Diastolic dysfunction 06/12/2015   Metacarpophalangeal joint pain of right hand 06/05/2013   PVC's (premature ventricular contractions) 06/05/2013   Pulmonary hypertension (HCC) 03/09/2013   Atrial flutter with rapid ventricular response (HCC) 02/23/2013   Trigger finger, acquired 08/02/2012   Mitral valve disease 10/13/2011   Allergy to IVP dye 08/27/2011   Long term current use of anticoagulant 08/27/2011   Normal coronary arteries 08/27/2011   Rheumatic heart disease 08/27/2011   MITRAL VALVE REPLACEMENT, HX OF 08/05/2006   Aortic valve replaced 08/05/2006   Hyperlipidemia 11/24/2005   Essential hypertension 11/24/2005    Keishon Chavarin Rober Minion, PT, MPH  09/16/2021, 2:45 PM  [facility] [address] [place] [address] [place], [place], [address] Phone: [phone]   Fax:  [phone]  Name: Erin Hill MRN: [id number] Date of Birth: [birth year]

## 2021-09-18 ENCOUNTER — Other Ambulatory Visit: Payer: Self-pay

## 2021-09-18 ENCOUNTER — Encounter: Payer: Self-pay | Admitting: Rehabilitative and Restorative Service Providers"

## 2021-09-18 ENCOUNTER — Ambulatory Visit: Payer: Medicare Other | Admitting: Rehabilitative and Restorative Service Providers"

## 2021-09-18 DIAGNOSIS — G8929 Other chronic pain: Secondary | ICD-10-CM

## 2021-09-18 DIAGNOSIS — R293 Abnormal posture: Secondary | ICD-10-CM

## 2021-09-18 DIAGNOSIS — M25512 Pain in left shoulder: Secondary | ICD-10-CM | POA: Diagnosis not present

## 2021-09-18 DIAGNOSIS — R29898 Other symptoms and signs involving the musculoskeletal system: Secondary | ICD-10-CM

## 2021-09-18 NOTE — Therapy (Signed)
[facility] [place] [address] [place], [place], [address] Phone: [phone]   Fax:  [phone]  Rationale for Evaluation and Treatment Rehabilitation  Patient Details  Name: Erin Hill MRN: [id number] Date of Birth: [birth year] Referring Provider (PT): Dr Benjamin Stain   Encounter Date: 09/18/2021   PT End of Session - 09/18/21 0929     Visit Number 6    Number of Visits 16    Date for PT Re-Evaluation 10/22/21    PT Start Time 0929    PT Stop Time 1017    PT Time Calculation (min) 48 min    Activity Tolerance Patient tolerated treatment well             Past Medical History:  Diagnosis Date   Atrial flutter (HCC)    2015   Hearing loss    chronic   Miscarriage    X 2   Proteinuria    chronic (not related to DM)   Rheumatic heart disease    s/p aortic/ mitral valve replace    Past Surgical History:  Procedure Laterality Date   double heart valve replacement  1999   echo EF 75-80%     SAB     X 2   stapes surgery, left  2003   then removed 2 years later   TAB      X 1   TONSILLECTOMY  as a child    There were no vitals filed for this visit.   Subjective Assessment - 09/18/21 0929     Subjective Had a massage yesterday and the shoulder is really sore today. Tight under the shoulder blade.    Currently in Pain? Yes    Pain Score 3     Pain Location Shoulder    Pain Orientation Left    Pain Descriptors / Indicators Sore    Pain Type Chronic pain    Pain Radiating Towards at times posterior deltoid    Pain Onset More than a month ago    Pain Frequency Intermittent                [facility] PT Assessment - 09/18/21 0001       Assessment   Medical Diagnosis Chronic Lt shoulder pain    Referring Provider (PT) Dr Benjamin Stain    Onset Date/Surgical Date 11/16/21    Hand Dominance Right    Next MD Visit 10/07/21    Prior Therapy none      AROM   Right Shoulder Extension 43 Degrees     Right Shoulder Flexion 148 Degrees    Right Shoulder ABduction 157 Degrees    Right Shoulder Internal Rotation --   thumb T 7/8   Right Shoulder External Rotation 82 Degrees   shoulder 90/elbow 90   Left Shoulder Extension 35 Degrees    Left Shoulder Flexion 135 Degrees    Left Shoulder ABduction 145 Degrees    Left Shoulder Internal Rotation --   hand to lateral sacrum   Left Shoulder External Rotation 48 Degrees   shoulder 90/elbow 90     Palpation   Spinal mobility hypomobility thoracic and cervical spine with PA mobs    Palpation comment muscular tightness Lt > Rt pecs; upper trap; leveator; biceps; deltoid                           [facility] Adult PT Treatment/Exercise - 09/18/21 0001  Shoulder Exercises: Seated   Other Seated Exercises thoracic extension on coregeous ball reaching overhead x 5; trunk rotation x 5      Shoulder Exercises: Standing   Extension Strengthening;Both;10 reps;Theraband    Theraband Level (Shoulder Extension) Level 3 (Green)    Row Strengthening;Both;20 reps;Theraband    Theraband Level (Shoulder Row) Level 3 (Green)    Row Limitations bow and arrow x 10 each side green TB    Retraction Strengthening;Both;20 reps;Theraband    Theraband Level (Shoulder Retraction) Level 1 (Yellow)    Retraction Limitations W red TB x 10    Other Standing Exercises axial extension 10 sec x 5; scap squeeze 10 sec x 5; L's x 10; W's x 10 noodle along spine      Shoulder Exercises: Pulleys   Flexion Limitations 10 reps x 10 sec hold    Scaption Limitations 10 reps x 10 sec hold    Other Pulley Exercises shoulder horizontal ab/add with pulley x 10 reps      Shoulder Exercises: ROM/Strengthening   Other ROM/Strengthening Exercises trunk rotation to Rt 10-15 sec hold x 3 reps      Shoulder Exercises: Stretch   Wall Stretch - ABduction Limitations horizontal abduction stretch 20 sec x 3 reps    Star Gazer Stretch 1 rep;60 seconds   supine arms to side  resting on table Lt ~ 40 deg abduction   Other Shoulder Stretches doorway 3 positions placing hands on doorway not stepping in 30 sec x 2 rep each - tightest mid    Other Shoulder Stretches biceps stretch 20 sec x 3 reps      Cryotherapy   Number Minutes Cryotherapy 10 Minutes    Cryotherapy Location Shoulder    Type of Cryotherapy Ice pack      Manual Therapy   Joint Mobilization Grade I/II through [facility] joint in inferior and posterior planes    Soft tissue mobilization deep tissue work through the Lt shoulder girdle anterior shoulder/pecs; upper trap; leveator; biceps; SCM    Myofascial Release anterior chest    Passive ROM Lt shoulder flexion; horizontal abduction; extension    Other Manual Therapy manual traction through the long arm post treatment                          PT Long Term Goals - 08/27/21 1556       PT LONG TERM GOAL #1   Title Decrease Lt shoulder pain by 75-100% with elevation and functional activities    Time 8    Period Weeks    Status New    Target Date 10/22/21      PT LONG TERM GOAL #2   Title Improve posture and alignment with patient to demonstrate improved upright posture with posterior shoudler girdle engaged    Time 8    Period Weeks    Status New    Target Date 10/22/21      PT LONG TERM GOAL #3   Title Increase AROM Lt shoulder to +/> than AROM Rt shoulder    Time 8    Period Weeks    Status New      PT LONG TERM GOAL #4   Title Independnt in HEP    Time 8    Period Weeks    Status New    Target Date 10/22/21      PT LONG TERM GOAL #5   Title Iprove functional limitation score to 65  Time 8    Period Weeks    Status New    Target Date 10/22/21                   Plan - 09/18/21 0932     Clinical Impression Statement Progressing with ROM and shoudler function. Good gains in ROM. She has increased soreness today from massage yesterday.    Rehab Potential Good    PT Frequency 2x / week    PT Duration 8  weeks    PT Treatment/Interventions ADLs/Self Care Home Management;Aquatic Therapy;Cryotherapy;Electrical Stimulation;Iontophoresis 4mg /ml Dexamethasone;Moist Heat;Ultrasound;Functional mobility training;Therapeutic activities;Therapeutic exercise;Neuromuscular re-education;Patient/family education;Manual techniques;Passive range of motion;Dry needling;Taping    PT Next Visit Plan review and progress HEP; continue work on posture and alignment; continue manual work (pt does not want to try DN); continue PROM; stretching; posterior shoulder girdle strengthening; modalities as indicated    PT Home Exercise Plan    Consulted and Agree with Plan of Care Patient             Patient will benefit from skilled therapeutic intervention in order to improve the following deficits and impairments:     Visit Diagnosis: Chronic left shoulder pain  Abnormal posture  Other symptoms and signs involving the musculoskeletal system     Problem List Patient Active Problem List   Diagnosis Date Noted   Chronic left shoulder pain 07/25/2021   Asymmetrical hearing loss 12/12/2020   Type 2 diabetes mellitus with other specified complication (HCC) 07/21/2017   Chest pain 07/20/2017   Diastolic dysfunction 06/12/2015   Metacarpophalangeal joint pain of right hand 06/05/2013   PVC's (premature ventricular contractions) 06/05/2013   Pulmonary hypertension (HCC) 03/09/2013   Atrial flutter with rapid ventricular response (HCC) 02/23/2013   Trigger finger, acquired 08/02/2012   Mitral valve disease 10/13/2011   Allergy to IVP dye 08/27/2011   Long term current use of anticoagulant 08/27/2011   Normal coronary arteries 08/27/2011   Rheumatic heart disease 08/27/2011   MITRAL VALVE REPLACEMENT, HX OF 08/05/2006   Aortic valve replaced 08/05/2006   Hyperlipidemia 11/24/2005   Essential hypertension 11/24/2005    Ivey Nembhard 01/24/2006, PT, MPH  09/18/2021, 10:14 AM  [facility] [address] [place] [address] [place], [place], [place] Phone: [phone]   Fax:  [phone]  Name: Erin Hill MRN: [id number] Date of Birth: Oct 09, 1955

## 2021-09-23 ENCOUNTER — Encounter: Payer: Self-pay | Admitting: Rehabilitative and Restorative Service Providers"

## 2021-09-23 ENCOUNTER — Ambulatory Visit: Payer: Medicare Other | Admitting: Rehabilitative and Restorative Service Providers"

## 2021-09-23 DIAGNOSIS — G8929 Other chronic pain: Secondary | ICD-10-CM

## 2021-09-23 DIAGNOSIS — R29898 Other symptoms and signs involving the musculoskeletal system: Secondary | ICD-10-CM

## 2021-09-23 DIAGNOSIS — R293 Abnormal posture: Secondary | ICD-10-CM

## 2021-09-23 DIAGNOSIS — M25512 Pain in left shoulder: Secondary | ICD-10-CM | POA: Diagnosis not present

## 2021-09-23 NOTE — Patient Instructions (Signed)

## 2021-09-23 NOTE — Therapy (Signed)
[facility] [place] [address] [place], [place], [address] Phone: [phone]   Fax:  [phone]  Physical Therapy Treatment Rationale for Evaluation and Treatment Rehabilitation  Patient Details  Name: Erin Hill MRN: [id number] Date of Birth: [birth year] Referring Provider (PT): Dr Dianah Field   Encounter Date: 09/23/2021   PT End of Session - 09/23/21 1532     Visit Number 7    Number of Visits 16    Date for PT Re-Evaluation 10/22/21    PT Start Time [id number]    PT Stop Time 1618    PT Time Calculation (min) 49 min    Activity Tolerance Patient tolerated treatment well             Past Medical History:  Diagnosis Date   Atrial flutter ([place])    2015   Hearing loss    chronic   Miscarriage    X 2   Proteinuria    chronic (not related to DM)   Rheumatic heart disease    s/p aortic/ mitral valve replace    Past Surgical History:  Procedure Laterality Date   double heart valve replacement  1999   echo EF 75-80%     SAB     X 2   stapes surgery, left  2003   then removed 2 years later   TAB      X 1   TONSILLECTOMY  as a child    There were no vitals filed for this visit.   Subjective Assessment - 09/23/21 1533     Subjective Shoulder has been hurting more this week. Trinell feels that she overdid it with the two therapy sessioins and deep tissue massage last week. Shoulder is not waking her up at night but has been hurting.    Currently in Pain? No/denies    Pain Score 0-No pain    Pain Location Shoulder    Pain Orientation Left    Pain Descriptors / Indicators Sore    Pain Type Chronic pain                               [facility] Adult PT Treatment/Exercise - 09/23/21 0001       Shoulder Exercises: Standing   Extension Strengthening;Both;10 reps;Theraband    Theraband Level (Shoulder Extension) Level 3 (Green)    Row Strengthening;Both;20 reps;Theraband    Theraband Level  (Shoulder Row) Level 3 (Green)    Row Limitations bow and arrow x 10 each side green TB    Retraction Strengthening;Both;20 reps;Theraband    Theraband Level (Shoulder Retraction) Level 1 (Yellow)    Retraction Limitations W red TB x 10    Other Standing Exercises axial extension 10 sec x 5; scap squeeze 10 sec x 5; L's x 10; W's x 10 noodle along spine      Shoulder Exercises: Pulleys   Flexion Limitations 10 reps x 10 sec hold    Scaption Limitations 10 reps x 10 sec hold    Other Pulley Exercises shoulder horizontal ab/add with pulley x 10 reps      Shoulder Exercises: Stretch   Other Shoulder Stretches doorway 3 positions placing hands on doorway not stepping in 30 sec x 2 rep each - tightest mid    Other Shoulder Stretches biceps stretch 20 sec x 3 reps      Moist Heat Therapy   Number Minutes Moist Heat 10 Minutes  Moist Heat Location Shoulder      Manual Therapy   Soft tissue mobilization deep tissue work through the Lt shoulder girdle anterior shoulder/pecs; upper trap; leveator; biceps; SCM    Myofascial Release anterior chest    Passive ROM Lt shoulder flexion; horizontal abduction; extension    Other Manual Therapy manual traction through the long arm post treatment              Trigger Point Dry Needling - 09/23/21 0001     Consent Given? Yes    Education Handout Provided Yes    Pectoralis Major Response Palpable increased muscle length    Pectoralis Minor Response Palpable increased muscle length    Biceps Response Palpable increased muscle length                   PT Education - 09/23/21 1542     Education Details DN    Person(s) Educated Patient    Methods Explanation;Handout    Comprehension Verbalized understanding                 PT Long Term Goals - 08/27/21 1556       PT LONG TERM GOAL #1   Title Decrease Lt shoulder pain by 75-100% with elevation and functional activities    Time 8    Period Weeks    Status New     Target Date 10/22/21      PT LONG TERM GOAL #2   Title Improve posture and alignment with patient to demonstrate improved upright posture with posterior shoudler girdle engaged    Time 8    Period Weeks    Status New    Target Date 10/22/21      PT LONG TERM GOAL #3   Title Increase AROM Lt shoulder to +/> than AROM Rt shoulder    Time 8    Period Weeks    Status New      PT LONG TERM GOAL #4   Title Independnt in HEP    Time 8    Period Weeks    Status New    Target Date 10/22/21      PT LONG TERM GOAL #5   Title Iprove functional limitation score to 65    Time 8    Period Weeks    Status New    Target Date 10/22/21                   Plan - 09/23/21 1535     Clinical Impression Statement Patient reports that she has increased pain in the Lt shoulder this week. ROM continues to increase both actively and passively. Continued muscular tightness in the Lt shoudler girdle. Trial of DN for anterior shoulder/arm.    Rehab Potential Good    PT Frequency 2x / week    PT Duration 8 weeks    PT Treatment/Interventions ADLs/Self Care Home Management;Aquatic Therapy;Cryotherapy;Electrical Stimulation;Iontophoresis 4mg /ml Dexamethasone;Moist Heat;Ultrasound;Functional mobility training;Therapeutic activities;Therapeutic exercise;Neuromuscular re-education;Patient/family education;Manual techniques;Passive range of motion;Dry needling;Taping    PT Next Visit Plan review and progress HEP; continue work on posture and alignment; continue manual work (pt does not want to try DN); continue PROM; stretching; posterior shoulder girdle strengthening; modalities as indicated; assess response to DN    PT Home Exercise Plan    Consulted and Agree with Plan of Care Patient             Patient will benefit from skilled therapeutic intervention in order to  improve the following deficits and impairments:     Visit Diagnosis: Chronic left shoulder pain  Abnormal  posture  Other symptoms and signs involving the musculoskeletal system     Problem List Patient Active Problem List   Diagnosis Date Noted   Chronic left shoulder pain 07/25/2021   Asymmetrical hearing loss 12/12/2020   Type 2 diabetes mellitus with other specified complication (HCC) 07/21/2017   Chest pain 07/20/2017   Diastolic dysfunction 06/12/2015   Metacarpophalangeal joint pain of right hand 06/05/2013   PVC's (premature ventricular contractions) 06/05/2013   Pulmonary hypertension (HCC) 03/09/2013   Atrial flutter with rapid ventricular response (HCC) 02/23/2013   Trigger finger, acquired 08/02/2012   Mitral valve disease 10/13/2011   Allergy to IVP dye 08/27/2011   Long term current use of anticoagulant 08/27/2011   Normal coronary arteries 08/27/2011   Rheumatic heart disease 08/27/2011   MITRAL VALVE REPLACEMENT, HX OF 08/05/2006   Aortic valve replaced 08/05/2006   Hyperlipidemia 11/24/2005   Essential hypertension 11/24/2005    Tamir Wallman Rober Minion, PT, MPH  09/23/2021, 4:11 PM  [facility] [address] [place] [address] [place], [place], [address] Phone: [phone]   Fax:  [phone]  Name: Erin Hill MRN: [id number] Date of Birth: January 12, 1956

## 2021-09-25 ENCOUNTER — Encounter: Payer: Self-pay | Admitting: Rehabilitative and Restorative Service Providers"

## 2021-09-25 ENCOUNTER — Ambulatory Visit: Payer: Medicare Other | Admitting: Rehabilitative and Restorative Service Providers"

## 2021-09-25 DIAGNOSIS — R293 Abnormal posture: Secondary | ICD-10-CM

## 2021-09-25 DIAGNOSIS — G8929 Other chronic pain: Secondary | ICD-10-CM

## 2021-09-25 DIAGNOSIS — R29898 Other symptoms and signs involving the musculoskeletal system: Secondary | ICD-10-CM

## 2021-09-25 DIAGNOSIS — M25512 Pain in left shoulder: Secondary | ICD-10-CM | POA: Diagnosis not present

## 2021-09-25 NOTE — Therapy (Signed)
[facility] [place] [address] [place], [place], [address] Phone: [phone]   Fax:  [phone]  Physical Therapy Treatment Rationale for Evaluation and Treatment Rehabilitation  Patient Details  Name: Erin Hill MRN: [id number] Date of Birth: 08-Jul-1955 Referring Provider (PT): Dr Benjamin Stain   Encounter Date: 09/25/2021   PT End of Session - 09/25/21 0930     Visit Number 8    Number of Visits 16    Date for PT Re-Evaluation 10/22/21    PT Start Time 0930    PT Stop Time 1019    PT Time Calculation (min) 49 min    Activity Tolerance Patient tolerated treatment well             Past Medical History:  Diagnosis Date   Atrial flutter (HCC)    2015   Hearing loss    chronic   Miscarriage    X 2   Proteinuria    chronic (not related to DM)   Rheumatic heart disease    s/p aortic/ mitral valve replace    Past Surgical History:  Procedure Laterality Date   double heart valve replacement  1999   echo EF 75-80%     SAB     X 2   stapes surgery, left  2003   then removed 2 years later   TAB      X 1   TONSILLECTOMY  as a child    There were no vitals filed for this visit.   Subjective Assessment - 09/25/21 0931     Subjective Patient reports that the shoulder felt looser after needling butshe has had some on and off pain in the past two days. Shoulder woke her up last night with pain in the Lt posterior shoulder to middle deltoid. She did a deep tissue massage yesterday and does know that she uses a lot of force through the arm and shoulder with her Lt UE.    Currently in Pain? No/denies    Pain Score 0-No pain    Pain Location Shoulder    Pain Orientation Left    Pain Descriptors / Indicators Sore    Pain Type Chronic pain    Pain Onset More than a month ago    Pain Frequency Intermittent                               [facility] Adult PT Treatment/Exercise - 09/25/21 0001        Shoulder Exercises: Standing   Extension Strengthening;Both;10 reps;Theraband    Theraband Level (Shoulder Extension) Level 3 (Green)    Row Strengthening;Both;20 reps;Theraband    Theraband Level (Shoulder Row) Level 3 (Green)    Row Limitations bow and arrow x 10 each side green TB    Retraction Strengthening;Both;20 reps;Theraband    Theraband Level (Shoulder Retraction) Level 1 (Yellow)    Retraction Limitations W red TB x 10    Other Standing Exercises axial extension 10 sec x 5; scap squeeze 10 sec x 5; L's x 10; W's x 10 noodle along spine      Shoulder Exercises: Pulleys   Flexion Limitations 10 reps x 10 sec hold    Scaption Limitations 10 reps x 10 sec hold    Other Pulley Exercises shoulder horizontal ab/add with pulley x 10 reps      Shoulder Exercises: Stretch   Other Shoulder Stretches doorway 3 positions placing hands  on doorway not stepping in 30 sec x 2 rep each - tightest mid    Other Shoulder Stretches biceps stretch 20 sec x 3 reps      Moist Heat Therapy   Number Minutes Moist Heat 10 Minutes    Moist Heat Location Shoulder      Manual Therapy   Manual therapy comments skilled palpation to assess response to DN and manual work    Soft tissue mobilization deep tissue work through the Lt shoulder girdle anterior shoulder/pecs; upper trap; leveator; biceps; SCM    Myofascial Release posterior shoulder girdle    Passive ROM Lt shoulder flexion; horizontal abduction; extension    Other Manual Therapy manual traction through the long arm post treatment              Trigger Point Dry Needling - 09/25/21 0001     Consent Given? Yes    Education Handout Provided Previously provided    Infraspinatus Response Palpable increased muscle length    Subscapularis Response Palpable increased muscle length    Deltoid Response Palpable increased muscle length    Latissimus dorsi Response Palpable increased muscle length    Teres major Response Palpable increased  muscle length    Teres minor Response Palpable increased muscle length                        PT Long Term Goals - 08/27/21 1556       PT LONG TERM GOAL #1   Title Decrease Lt shoulder pain by 75-100% with elevation and functional activities    Time 8    Period Weeks    Status New    Target Date 10/22/21      PT LONG TERM GOAL #2   Title Improve posture and alignment with patient to demonstrate improved upright posture with posterior shoudler girdle engaged    Time 8    Period Weeks    Status New    Target Date 10/22/21      PT LONG TERM GOAL #3   Title Increase AROM Lt shoulder to +/> than AROM Rt shoulder    Time 8    Period Weeks    Status New      PT LONG TERM GOAL #4   Title Independnt in HEP    Time 8    Period Weeks    Status New    Target Date 10/22/21      PT LONG TERM GOAL #5   Title Iprove functional limitation score to 65    Time 8    Period Weeks    Status New    Target Date 10/22/21                   Plan - 09/25/21 0943     Clinical Impression Statement Patient did well following DN and manual work last visit. She reports some increase in Lt shoulder pain last night with pain awakening her from sleep. She had some tightness and pain this moring but shoulder is feeling better now. Patient did a deep tissue massage yesterday with that activity likely contributing to increased pain in the night. She continues to have muscular tightness through the Lt pecs, anterior shoulder, biceps, teres, lats. Good response to manual work and exercise. Cotninues to progress gradually.    PT Frequency 2x / week    PT Duration 8 weeks    PT Treatment/Interventions ADLs/Self Care Home Management;Aquatic Therapy;Cryotherapy;Electrical Stimulation;Iontophoresis 4mg /ml  Dexamethasone;Moist Heat;Ultrasound;Functional mobility training;Therapeutic activities;Therapeutic exercise;Neuromuscular re-education;Patient/family education;Manual techniques;Passive  range of motion;Dry needling;Taping    PT Next Visit Plan review and progress HEP; continue work on posture and alignment; continue manual work (pt does not want to try DN); continue PROM; stretching; posterior shoulder girdle strengthening; modalities as indicated; continue DN as indicated    PT Home Exercise Plan [id number]    Consulted and Agree with Plan of Care Patient             Patient will benefit from skilled therapeutic intervention in order to improve the following deficits and impairments:     Visit Diagnosis: Chronic left shoulder pain  Abnormal posture  Other symptoms and signs involving the musculoskeletal system     Problem List Patient Active Problem List   Diagnosis Date Noted   Chronic left shoulder pain 07/25/2021   Asymmetrical hearing loss 12/12/2020   Type 2 diabetes mellitus with other specified complication (HCC) 07/21/2017   Chest pain 07/20/2017   Diastolic dysfunction 06/12/2015   Metacarpophalangeal joint pain of right hand 06/05/2013   PVC's (premature ventricular contractions) 06/05/2013   Pulmonary hypertension (HCC) 03/09/2013   Atrial flutter with rapid ventricular response (HCC) 02/23/2013   Trigger finger, acquired 08/02/2012   Mitral valve disease 10/13/2011   Allergy to IVP dye 08/27/2011   Long term current use of anticoagulant 08/27/2011   Normal coronary arteries 08/27/2011   Rheumatic heart disease 08/27/2011   MITRAL VALVE REPLACEMENT, HX OF 08/05/2006   Aortic valve replaced 08/05/2006   Hyperlipidemia 11/24/2005   Essential hypertension 11/24/2005    Ruben Mahler Rober Minion, PT, MPH  09/25/2021, 10:36 AM  [facility] [address] [place], [place], [address] Phone: [phone]   Fax:  [phone]  Name: Erin Hill MRN: [id number] Date of Birth: 10-08-1955

## 2021-09-30 ENCOUNTER — Encounter: Payer: Self-pay | Admitting: Rehabilitative and Restorative Service Providers"

## 2021-09-30 ENCOUNTER — Ambulatory Visit: Payer: Medicare Other | Admitting: Rehabilitative and Restorative Service Providers"

## 2021-09-30 ENCOUNTER — Other Ambulatory Visit: Payer: Self-pay

## 2021-09-30 DIAGNOSIS — R29898 Other symptoms and signs involving the musculoskeletal system: Secondary | ICD-10-CM

## 2021-09-30 DIAGNOSIS — R293 Abnormal posture: Secondary | ICD-10-CM

## 2021-09-30 DIAGNOSIS — G8929 Other chronic pain: Secondary | ICD-10-CM

## 2021-09-30 DIAGNOSIS — M25512 Pain in left shoulder: Secondary | ICD-10-CM | POA: Diagnosis not present

## 2021-09-30 NOTE — Therapy (Signed)
[facility] [place] [address] [place], [place], [address] Phone: [phone]   Fax:  [phone]  Physical Therapy Treatment Rationale for Evaluation and Treatment Rehabilitation  Patient Details  Name: Erin Hill MRN: [id number] Date of Birth: 1955-03-12 Referring Provider (PT): Dr Benjamin Stain   Encounter Date: 09/30/2021   PT End of Session - 09/30/21 1439     Visit Number 9    Number of Visits 16    Date for PT Re-Evaluation 10/22/21    PT Start Time 1439    PT Stop Time 1529    PT Time Calculation (min) 50 min             Past Medical History:  Diagnosis Date   Atrial flutter (HCC)    2015   Hearing loss    chronic   Miscarriage    X 2   Proteinuria    chronic (not related to DM)   Rheumatic heart disease    s/p aortic/ mitral valve replace    Past Surgical History:  Procedure Laterality Date   double heart valve replacement  1999   echo EF 75-80%     SAB     X 2   stapes surgery, left  2003   then removed 2 years later   TAB      X 1   TONSILLECTOMY  as a child    There were no vitals filed for this visit.   Subjective Assessment - 09/30/21 1445     Subjective Shoulder felt great when she left PT Thursday but then she did a massage and the shoulder started to hurt. She did two more massages and the pain has continued since that time. Can't sleep on the Lt side now. Shoulder is aching and sore.    Currently in Pain? Yes    Pain Score 2     Pain Location Shoulder    Pain Orientation Left    Pain Descriptors / Indicators Aching;Sore    Pain Type Chronic pain                [facility] PT Assessment - 09/30/21 0001       Assessment   Medical Diagnosis Chronic Lt shoulder pain    Referring Provider (PT) Dr Benjamin Stain    Onset Date/Surgical Date 11/16/21    Hand Dominance Right    Next MD Visit 10/07/21    Prior Therapy none      AROM   Right Shoulder Extension 43 Degrees     Right Shoulder Flexion 148 Degrees    Right Shoulder ABduction 157 Degrees    Right Shoulder Internal Rotation --   thumb T 7/8   Right Shoulder External Rotation 82 Degrees   shoulder 90/elbow 90   Left Shoulder Extension 40 Degrees    Left Shoulder Flexion 140 Degrees   end range pain   Left Shoulder ABduction 150 Degrees   painful mid to end range   Left Shoulder Internal Rotation --   hand to sacrum   Left Shoulder External Rotation 52 Degrees   shoulder 90/elbow 90 - painful     Palpation   Palpation comment muscular tightness Lt > Rt pecs; upper trap; leveator; biceps; deltoid; infraspinitus                           [facility] Adult PT Treatment/Exercise - 09/30/21 0001       Shoulder Exercises: Standing  Extension Strengthening;Both;10 reps;Theraband    Theraband Level (Shoulder Extension) Level 3 (Green)    Row Strengthening;Both;20 reps;Theraband    Theraband Level (Shoulder Row) Level 3 (Green)    Row Limitations bow and arrow x 10 each side green TB    Retraction Strengthening;Both;20 reps;Theraband    Theraband Level (Shoulder Retraction) Level 1 (Yellow)    Retraction Limitations W red TB x 10    Other Standing Exercises axial extension 10 sec x 5; scap squeeze 10 sec x 5; L's x 10; W's x 10 noodle along spine      Shoulder Exercises: Pulleys   Flexion Limitations 10 reps x 10 sec hold    Scaption Limitations 10 reps x 10 sec hold    Other Pulley Exercises shoulder horizontal ab/add with pulley x 10 reps      Shoulder Exercises: Stretch   Other Shoulder Stretches doorway 3 positions placing hands on doorway not stepping in 30 sec x 2 rep each - tightest mid    Other Shoulder Stretches biceps stretch 20 sec x 3 reps      Moist Heat Therapy   Number Minutes Moist Heat 10 Minutes    Moist Heat Location Shoulder      Manual Therapy   Manual therapy comments skilled palpation to assess response to DN and manual work    Soft tissue mobilization deep  tissue work through the Lt shoulder girdle anterior shoulder/pecs; upper trap; leveator; biceps; SCM    Myofascial Release posterior shoulder girdle    Passive ROM Lt shoulder flexion; horizontal abduction; extension    Other Manual Therapy manual traction through the long arm post treatment              Trigger Point Dry Needling - 09/30/21 0001     Consent Given? Yes    Education Handout Provided Previously provided    Pectoralis Major Response Palpable increased muscle length    Pectoralis Minor Response Palpable increased muscle length    Infraspinatus Response Palpable increased muscle length    Subscapularis Response Palpable increased muscle length    Deltoid Response Palpable increased muscle length    Teres major Response Palpable increased muscle length    Teres minor Response Palpable increased muscle length    Biceps Response Palpable increased muscle length                        PT Long Term Goals - 08/27/21 1556       PT LONG TERM GOAL #1   Title Decrease Lt shoulder pain by 75-100% with elevation and functional activities    Time 8    Period Weeks    Status New    Target Date 10/22/21      PT LONG TERM GOAL #2   Title Improve posture and alignment with patient to demonstrate improved upright posture with posterior shoudler girdle engaged    Time 8    Period Weeks    Status New    Target Date 10/22/21      PT LONG TERM GOAL #3   Title Increase AROM Lt shoulder to +/> than AROM Rt shoulder    Time 8    Period Weeks    Status New      PT LONG TERM GOAL #4   Title Independnt in HEP    Time 8    Period Weeks    Status New    Target Date 10/22/21  PT LONG TERM GOAL #5   Title Iprove functional limitation score to 65    Time 8    Period Weeks    Status New    Target Date 10/22/21                   Plan - 09/30/21 1548     Clinical Impression Statement Excellent response to last treatment with full resolution of  pain until she did 3 massages that same day. Patient has continued limitation Lt shoulder ROM, pain with Lt shoulder flexion, scaption, ER, IR. She has muscular tightness to palpation through the pecs, biceps; deltoid; upper trap; infraspinitus; teres; lats. Good response to DN and manual work.    Rehab Potential Good    PT Frequency 2x / week    PT Duration 8 weeks    PT Treatment/Interventions ADLs/Self Care Home Management;Aquatic Therapy;Cryotherapy;Electrical Stimulation;Iontophoresis 4mg /ml Dexamethasone;Moist Heat;Ultrasound;Functional mobility training;Therapeutic activities;Therapeutic exercise;Neuromuscular re-education;Patient/family education;Manual techniques;Passive range of motion;Dry needling;Taping    PT Next Visit Plan review and progress HEP; continue work on posture and alignment; continue manual work (pt does not want to try DN); continue PROM; stretching; posterior shoulder girdle strengthening; modalities as indicated; continue DN as indicated    PT Home Exercise Plan    Consulted and Agree with Plan of Care Patient             Patient will benefit from skilled therapeutic intervention in order to improve the following deficits and impairments:     Visit Diagnosis: Chronic left shoulder pain  Abnormal posture  Other symptoms and signs involving the musculoskeletal system     Problem List Patient Active Problem List   Diagnosis Date Noted   Chronic left shoulder pain 07/25/2021   Asymmetrical hearing loss 12/12/2020   Type 2 diabetes mellitus with other specified complication (HCC) 07/21/2017   Chest pain 07/20/2017   Diastolic dysfunction 06/12/2015   Metacarpophalangeal joint pain of right hand 06/05/2013   PVC's (premature ventricular contractions) 06/05/2013   Pulmonary hypertension (HCC) 03/09/2013   Atrial flutter with rapid ventricular response (HCC) 02/23/2013   Trigger finger, acquired 08/02/2012   Mitral valve disease 10/13/2011    Allergy to IVP dye 08/27/2011   Long term current use of anticoagulant 08/27/2011   Normal coronary arteries 08/27/2011   Rheumatic heart disease 08/27/2011   MITRAL VALVE REPLACEMENT, HX OF 08/05/2006   Aortic valve replaced 08/05/2006   Hyperlipidemia 11/24/2005   Essential hypertension 11/24/2005    Ryker Sudbury 01/24/2006, PT, MPH  09/30/2021, 3:57 PM  [facility] [address] [place], [place], [place] Phone: [phone]   Fax:  [phone]  Name: MABEL ROLL MRN: Molli Barrows Date of Birth: 1955-06-25

## 2021-10-01 ENCOUNTER — Encounter: Payer: Self-pay | Admitting: Family Medicine

## 2021-10-01 ENCOUNTER — Ambulatory Visit (INDEPENDENT_AMBULATORY_CARE_PROVIDER_SITE_OTHER): Payer: Medicare Other | Admitting: Family Medicine

## 2021-10-01 VITALS — BP 127/65 | HR 55 | Ht 62.0 in | Wt 156.0 lb

## 2021-10-01 DIAGNOSIS — M25512 Pain in left shoulder: Secondary | ICD-10-CM

## 2021-10-01 DIAGNOSIS — Z780 Asymptomatic menopausal state: Secondary | ICD-10-CM | POA: Diagnosis not present

## 2021-10-01 DIAGNOSIS — I10 Essential (primary) hypertension: Secondary | ICD-10-CM

## 2021-10-01 DIAGNOSIS — E1169 Type 2 diabetes mellitus with other specified complication: Secondary | ICD-10-CM | POA: Diagnosis not present

## 2021-10-01 DIAGNOSIS — G8929 Other chronic pain: Secondary | ICD-10-CM

## 2021-10-01 MED ORDER — ACCU-CHEK GUIDE VI STRP: ORAL_STRIP | 6 refills | Status: DC

## 2021-10-01 NOTE — Assessment & Plan Note (Signed)
Going physical therapy follows up with Dr. Benjamin Stain next week.

## 2021-10-01 NOTE — Assessment & Plan Note (Signed)
Blood pressure looks phenomenal today. 

## 2021-10-01 NOTE — Assessment & Plan Note (Signed)
For repeat A1c.  Sounds like her overall fasting blood sugars have improved.  They would like to see them consistently under 130.

## 2021-10-01 NOTE — Progress Notes (Signed)
Established Patient Office Visit  Subjective   Patient ID: Erin Hill, adult    DOB: November 22, 1955  Age: 66 y.o. MRN: [id number]  Chief Complaint  Patient presents with   Follow-up    Rx fill    HPI  Erin Hill is a returning patient.  I have been seeing her for years until recently when her insurance changed and in network was within [facility].  Erin Hill is now on Medicare now that Erin Hill is 54 and has come back.  Diabetes - no hypoglycemic events. No wounds or sores that are not healing well. No increased thirst or urination. Checking glucose at home. Taking medications as prescribed without any side effects.  Dose of Januvia was recently increased to 50 mg last A1c was in May and it was 7.5.  At the time blood sugars were running between 180 and 200.  More recently glucose levels have been running between 130 and 160.  Hypertension- Pt denies chest pain, SOB, dizziness, or heart palpitations.  Taking meds as directed w/o problems.  Denies medication side effects.    He is also been dealing with some issues in her left shoulder since last fall.  Erin Hill unfortunately was helping take care of her husband who was very ill and eventually passed away.  Erin Hill finally ended up having an MRI this summer Erin Hill has been engaged in formal physical therapy with [employer].  They have been doing some dry needling and taping.  It has helped some.  Following up with sports medicine next week.  Erin Hill does have follow-up with cardiology scheduled in November.   Scheduling mammogram in the fall at [facility].  Will need a DEXA.     ROS    Objective:     BP 127/65   Pulse (!) 55   Ht 5\' 2"  (1.575 m)   Wt 156 lb (70.8 kg)   SpO2 95%   BMI 28.53 kg/m    Physical Exam Constitutional:      Appearance: Erin Hill is well-developed.  HENT:     Head: Normocephalic and atraumatic.     Nose: Nose normal.  Eyes:     Conjunctiva/sclera: Conjunctivae normal.     Pupils: Pupils are equal, round, and reactive to  light.  Neck:     Thyroid: No thyromegaly.  Cardiovascular:     Rate and Rhythm: Normal rate and regular rhythm.     Heart sounds: Murmur heard.  Pulmonary:     Effort: Pulmonary effort is normal.     Breath sounds: Normal breath sounds. No wheezing.  Musculoskeletal:     Cervical back: Neck supple.  Lymphadenopathy:     Cervical: No cervical adenopathy.  Skin:    General: Skin is warm and dry.  Neurological:     Mental Status: Erin Hill is alert and oriented to person, place, and time.      No results found for any visits on 10/01/21.    The 10-year ASCVD risk score (Arnett DK, et al., 2019) is: 12.6%    Assessment & Plan:   Problem List Items Addressed This Visit       Cardiovascular and Mediastinum   Essential hypertension - Primary    Blood  pressure looks phenomenal today.      Relevant Orders   Hemoglobin A1c   BASIC METABOLIC PANEL WITH GFR     Endocrine   Type 2 diabetes mellitus with other specified complication (HCC)    For repeat A1c.  Sounds like her  overall fasting blood sugars have improved.  They would like to see them consistently under 130.      Relevant Medications   glucose blood (ACCU-CHEK GUIDE) test strip   Other Relevant Orders   Hemoglobin A1c   BASIC METABOLIC PANEL WITH GFR     Other   Chronic left shoulder pain    Going physical therapy follows up with Dr. Benjamin Stain next week.      Other Visit Diagnoses     Post-menopausal       Relevant Orders   DG Bone Density       Recent labs look fairly up-to-date done in May including a lipid panel and ACR.  Serum creatinine 1 BUN which is slightly elevated above baseline that I had on file from a year ago some get a go ahead and recheck that when Erin Hill comes in for her A1c at the end of the week.  Did go ahead and refill the Accu-Chek strips.    Return in about 3 months (around 01/05/2022) for Diabetes follow-up.    Nani Gasser, MD

## 2021-10-02 ENCOUNTER — Other Ambulatory Visit: Payer: Self-pay

## 2021-10-02 ENCOUNTER — Ambulatory Visit: Payer: Medicare Other | Admitting: Rehabilitative and Restorative Service Providers"

## 2021-10-02 DIAGNOSIS — G8929 Other chronic pain: Secondary | ICD-10-CM

## 2021-10-02 DIAGNOSIS — M25512 Pain in left shoulder: Secondary | ICD-10-CM | POA: Diagnosis not present

## 2021-10-02 DIAGNOSIS — R293 Abnormal posture: Secondary | ICD-10-CM

## 2021-10-02 DIAGNOSIS — R29898 Other symptoms and signs involving the musculoskeletal system: Secondary | ICD-10-CM

## 2021-10-02 NOTE — Therapy (Signed)
[place], [place], [address] Phone: [phone]   Fax:  [phone]  Physical Therapy Treatment Rationale for Evaluation and Treatment Rehabilitation  Patient Details  Name: Erin Hill MRN: [id number] Date of Birth: 1956/02/16 Referring Provider (PT): Dr Dianah Field   Encounter Date: 10/02/2021   PT End of Session - 10/02/21 0812     Visit Number 10    Number of Visits 16    Date for PT Re-Evaluation 10/22/21    PT Start Time 0800    PT Stop Time 0848    PT Time Calculation (min) 48 min    Activity Tolerance Patient tolerated treatment well             Past Medical History:  Diagnosis Date   Atrial flutter ([place])    2015   Hearing loss    chronic   Miscarriage    X 2   Proteinuria    chronic (not related to DM)   Rheumatic heart disease    s/p aortic/ mitral valve replace    Past Surgical History:  Procedure Laterality Date   double heart valve replacement  1999   echo EF 75-80%     SAB     X 2   stapes surgery, left  2003   then removed 2 years later   TAB      X 1   TONSILLECTOMY  as a child    There were no vitals filed for this visit.   Subjective Assessment - 10/02/21 0813     Subjective Patient reports that her shoulder "really good". She did 3 massages yesterday and she still feels good    Currently in Pain? No/denies    Pain Score 0-No pain                [facility] PT Assessment - 10/02/21 0001       Assessment   Medical Diagnosis Chronic Lt shoulder pain    Referring Provider (PT) Dr Dianah Field    Onset Date/Surgical Date 11/16/21    Hand Dominance Right    Next MD Visit 10/07/21    Prior Therapy none      AROM   Left Shoulder Extension 40 Degrees    Left Shoulder Flexion 140 Degrees   end range pain   Left Shoulder ABduction 150 Degrees   painful mid to end range   Left Shoulder Internal Rotation --   hand to sacrum   Left Shoulder  External Rotation 68 Degrees   shoulder 90/elbow 90 - painful     Palpation   Palpation comment muscular tightness Lt > Rt pecs; upper trap; leveator; biceps; deltoid; infraspinitus                           [facility] Adult PT Treatment/Exercise - 10/02/21 0001       Shoulder Exercises: Standing   External Rotation Strengthening;Left;10 reps;Theraband    Theraband Level (Shoulder External Rotation) Level 2 (Red)    External Rotation Limitations isometric step out    Internal Rotation Strengthening;Left;10 reps;Theraband    Theraband Level (Shoulder Internal Rotation) Level 2 (Red)    Internal Rotation Limitations isometric step out to side    Extension Strengthening;Both;10 reps;Theraband    Theraband Level (Shoulder Extension) Level 3 (Green)    Row Strengthening;Both;20 reps;Theraband    Theraband Level (Shoulder Row) Level 4 (Blue)    Row Limitations bow and arrow  x 10 each side blue TB    Retraction Strengthening;Both;20 reps;Theraband    Theraband Level (Shoulder Retraction) Level 2 (Red)    Retraction Limitations W red TB x 10      Shoulder Exercises: Pulleys   Flexion Limitations 10 reps x 10 sec hold    Scaption Limitations 10 reps x 10 sec hold    Other Pulley Exercises shoulder horizontal ab/add with pulley x 10 reps      Shoulder Exercises: Stretch   Internal Rotation Stretch Limitations lat stretch 30 sec x 3 reps supine    Other Shoulder Stretches doorway 3 positions placing hands on doorway not stepping in 30 sec x 2 rep each - tightest mid    Other Shoulder Stretches biceps stretch 20 sec x 3 reps      Moist Heat Therapy   Number Minutes Moist Heat 10 Minutes    Moist Heat Location Shoulder      Manual Therapy   Soft tissue mobilization deep tissue work through the Lt shoulder girdle anterior shoulder/pecs; upper trap; leveator; biceps; SCM    Myofascial Release posterior shoulder girdle    Passive ROM Lt shoulder flexion; horizontal abduction;  extension    Other Manual Therapy manual traction through the long arm post treatment                          PT Long Term Goals - 08/27/21 1556       PT LONG TERM GOAL #1   Title Decrease Lt shoulder pain by 75-100% with elevation and functional activities    Time 8    Period Weeks    Status New    Target Date 10/22/21      PT LONG TERM GOAL #2   Title Improve posture and alignment with patient to demonstrate improved upright posture with posterior shoudler girdle engaged    Time 8    Period Weeks    Status New    Target Date 10/22/21      PT LONG TERM GOAL #3   Title Increase AROM Lt shoulder to +/> than AROM Rt shoulder    Time 8    Period Weeks    Status New      PT LONG TERM GOAL #4   Title Independnt in HEP    Time 8    Period Weeks    Status New    Target Date 10/22/21      PT LONG TERM GOAL #5   Title Iprove functional limitation score to 65    Time 8    Period Weeks    Status New    Target Date 10/22/21                   Plan - 10/02/21 0814     Clinical Impression Statement Excellent progress with shoulder rehab. Better in the past two days even with increase in work activites yesterday. Sleeping without awakening due to pain an using Lt UE for more functional and work activities. Continued with stretching and strengthennig. Adding TB strengthening. Continued manual work to address tightness in the Lt shoudler girdle. Progressig well toward rehab goals.    Rehab Potential Good    PT Frequency 2x / week    PT Duration 8 weeks    PT Treatment/Interventions ADLs/Self Care Home Management;Aquatic Therapy;Cryotherapy;Electrical Stimulation;Iontophoresis 4mg /ml Dexamethasone;Moist Heat;Ultrasound;Functional mobility training;Therapeutic activities;Therapeutic exercise;Neuromuscular re-education;Patient/family education;Manual techniques;Passive range of motion;Dry needling;Taping    PT Next  Visit Plan review and progress HEP; continue  work on posture and alignment; continue manual work (pt does not want to try DN); continue PROM; stretching; posterior shoulder girdle strengthening; modalities as indicated; continue DN as indicated    PT Home Exercise Plan [email or web address]    Consulted and Agree with Plan of Care Patient             Patient will benefit from skilled therapeutic intervention in order to improve the following deficits and impairments:     Visit Diagnosis: Chronic left shoulder pain  Abnormal posture  Other symptoms and signs involving the musculoskeletal system     Problem List Patient Active Problem List   Diagnosis Date Noted   Chronic left shoulder pain 07/25/2021   Asymmetrical hearing loss 12/12/2020   Type 2 diabetes mellitus with other specified complication (HCC) 07/21/2017   Diastolic dysfunction 06/12/2015   Metacarpophalangeal joint pain of right hand 06/05/2013   PVC's (premature ventricular contractions) 06/05/2013   Pulmonary hypertension (HCC) 03/09/2013   Atrial flutter with rapid ventricular response (HCC) 02/23/2013   Trigger finger, acquired 08/02/2012   Mitral valve disease 10/13/2011   Allergy to IVP dye 08/27/2011   Long term current use of anticoagulant 08/27/2011   Normal coronary arteries 08/27/2011   Rheumatic heart disease 08/27/2011   MITRAL VALVE REPLACEMENT, HX OF 08/05/2006   Aortic valve replaced 08/05/2006   Hyperlipidemia 11/24/2005   Essential hypertension 11/24/2005    Afrika Brick Rober Minion, PT, MPH  10/02/2021, 8:49 AM  [facility] [address] [place], [place], [address] Phone: [phone]   Fax:  [phone]  Name: Erin Hill MRN: [id number] Date of Birth: Jun 19, 1955

## 2021-10-02 NOTE — Patient Instructions (Signed)
Access Code: [email or web address] URL: [email or web address] Date: 10/02/2021 Prepared by: Corlis Leak  Exercises - Seated Cervical Retraction  - 3 x daily - 7 x weekly - 1 sets - 10 reps - Standing Scapular Retraction  - 3 x daily - 7 x weekly - 1 sets - 10 reps - 10 hold - Shoulder External Rotation and Scapular Retraction  - 3 x daily - 7 x weekly - 1 sets - 10 reps -   hold - Shoulder External Rotation in 45 Degrees Abduction  - 2 x daily - 7 x weekly - 1-2 sets - 10 reps - 3 sec  hold - Doorway Pec Stretch at 60 Degrees Abduction  - 3 x daily - 7 x weekly - 1 sets - 3 reps - Doorway Pec Stretch at 90 Degrees Abduction  - 3 x daily - 7 x weekly - 1 sets - 3 reps - 30 seconds  hold - Doorway Pec Stretch at 120 Degrees Abduction  - 3 x daily - 7 x weekly - 1 sets - 3 reps - 30 second hold  hold - Seated Shoulder Flexion AAROM with Pulley Behind  - 2 x daily - 7 x weekly - 1 sets - 10 reps - 10 sec  hold - Seated Shoulder Scaption AAROM with Pulley at Side  - 2 x daily - 7 x weekly - 1 sets - 10 reps - 10sec  hold - Supine Chest Stretch on Foam Roll  - 2 x daily - 7 x weekly - 1 sets - 1 reps - 2-5 min  sec  hold - Supine Lower Trunk Rotation  - 2 x daily - 7 x weekly - 1 sets - 3-5 reps - 20-30 sec  hold - Prone Scapular Retraction  - 1 x daily - 7 x weekly - 1-3 sets - 10 reps - 5-10 sec  hold - Shoulder External Rotation and Scapular Retraction with Resistance  - 3-4 x daily - 7 x weekly - 2-3 sets - 10 reps - 3-5 sec  hold - Standing Bilateral Low Shoulder Row with Anchored Resistance  - 2 x daily - 7 x weekly - 1-3 sets - 10 reps - 2-3 sec  hold - Drawing Bow  - 1 x daily - 7 x weekly - 1 sets - 10 reps - 3 sec  hold - Bicep Stretch at Table  - 2 x daily - 7 x weekly - 1 sets - 3 reps - 30 sec  hold - Shoulder W - External Rotation with Resistance  - 2 x daily - 7 x weekly - 1-2 sets - 10 reps - 3 sec  hold - Shoulder extension with resistance - Neutral  - 1 x daily - 7 x weekly - 1-2  sets - 10 reps - 3-5 sec  hold - Standing Overhead Shoulder External Rotation Stretch with Towel  - 2 x daily - 7 x weekly - 1 sets - 3-5 reps - 10-30 sec  hold - Shoulder ER Stretch in Abduction  - 2 x daily - 7 x weekly - 1 sets - 3 reps - 30 sec  hold - Standing Thoracic Open Book at Wall  - 2 x daily - 7 x weekly - 1 sets - 3 reps - 30 sec  hold - Shoulder External Rotation Reactive Isometrics  - 1 x daily - 7 x weekly - 1 sets - 5-10 reps - 10-30 sec  hold - Shoulder Internal Rotation Reactive Isometrics  -  2 x daily - 7 x weekly - 1 sets - 10 reps - 3-5 sec  hold

## 2021-10-03 NOTE — Telephone Encounter (Signed)
Can you print the DEXA order Arline Asp and fax over. I am out of hte office right now;

## 2021-10-06 ENCOUNTER — Other Ambulatory Visit: Payer: Self-pay | Admitting: Family Medicine

## 2021-10-06 DIAGNOSIS — E1169 Type 2 diabetes mellitus with other specified complication: Secondary | ICD-10-CM

## 2021-10-06 DIAGNOSIS — E119 Type 2 diabetes mellitus without complications: Secondary | ICD-10-CM

## 2021-10-07 ENCOUNTER — Encounter: Payer: Self-pay | Admitting: Family Medicine

## 2021-10-07 ENCOUNTER — Encounter: Payer: Self-pay | Admitting: Rehabilitative and Restorative Service Providers"

## 2021-10-07 ENCOUNTER — Ambulatory Visit: Payer: Medicare Other | Admitting: Rehabilitative and Restorative Service Providers"

## 2021-10-07 ENCOUNTER — Ambulatory Visit (INDEPENDENT_AMBULATORY_CARE_PROVIDER_SITE_OTHER): Payer: Medicare Other | Admitting: Sports Medicine

## 2021-10-07 ENCOUNTER — Other Ambulatory Visit: Payer: Self-pay | Admitting: Family Medicine

## 2021-10-07 DIAGNOSIS — M25512 Pain in left shoulder: Secondary | ICD-10-CM | POA: Diagnosis not present

## 2021-10-07 DIAGNOSIS — R293 Abnormal posture: Secondary | ICD-10-CM

## 2021-10-07 DIAGNOSIS — E119 Type 2 diabetes mellitus without complications: Secondary | ICD-10-CM

## 2021-10-07 DIAGNOSIS — E1169 Type 2 diabetes mellitus with other specified complication: Secondary | ICD-10-CM

## 2021-10-07 DIAGNOSIS — G8929 Other chronic pain: Secondary | ICD-10-CM

## 2021-10-07 DIAGNOSIS — R29898 Other symptoms and signs involving the musculoskeletal system: Secondary | ICD-10-CM

## 2021-10-07 NOTE — Progress Notes (Signed)
    Procedures performed today:    None.  Independent interpretation of notes and tests performed by another provider:   None.  Brief History, Exam, Impression, and Recommendations:    Chronic left shoulder pain This pleasant 66 year old female returns, she does have a high-grade partial-thickness tear, greater than 50% of the supraspinatus footprint. She has been doing physical therapy and has improved considerably. She does have a little of loss of external rotation on the left consistent with adhesive capsulitis considering no chondral abnormalities on the MRI. She is happy with how things are going so we will continue with a home exercise program and can return to see me as needed.  If she does end up with a recurrence of pain or desires an injection we would likely do glenohumeral.    ____________________________________________ Ihor Austin. Benjamin Stain, M.D., ABFM., CAQSM., AME. Primary Care and Sports Medicine [facility]  Adjunct Professor of Family Medicine  [employer]  Restaurant manager, fast food

## 2021-10-07 NOTE — Therapy (Signed)
[facility] [address] [place], [place], [address] Phone: [phone]   Fax:  [phone]  Physical Therapy Treatment Rationale for Evaluation and Treatment Rehabilitation  Patient Details  Name: Erin Hill MRN: [id number] Date of Birth: [birth year] Referring Provider (PT): Dr Benjamin Stain   Encounter Date: 10/07/2021   PT End of Session - 10/07/21 1104     Visit Number 11    Number of Visits 16    Date for PT Re-Evaluation 10/22/21    PT Start Time 1100    PT Stop Time 1148    PT Time Calculation (min) 48 min    Activity Tolerance Patient tolerated treatment well             Past Medical History:  Diagnosis Date   Atrial flutter ([facility])    2015   Hearing loss    chronic   Miscarriage    X 2   Proteinuria    chronic (not related to DM)   Rheumatic heart disease    s/p aortic/ mitral valve replace    Past Surgical History:  Procedure Laterality Date   double heart valve replacement  1999   echo EF 75-80%     SAB     X 2   stapes surgery, left  2003   then removed 2 years later   TAB      X 1   TONSILLECTOMY  as a child    There were no vitals filed for this visit.   Subjective Assessment - 10/07/21 1104     Subjective Saw Dr T today and he is pleased with progress. He feels she can continue with independent HEP if she is OK with that. Bonne would like to continue with therapy for a few more visits.    Currently in Pain? No/denies    Pain Score 0-No pain    Pain Location Shoulder    Pain Orientation Left                [facility] PT Assessment - 10/07/21 0001       Assessment   Medical Diagnosis Chronic Lt shoulder pain    Referring Provider (PT) Dr Benjamin Stain    Onset Date/Surgical Date 11/16/21    Hand Dominance Right    Next MD Visit PRN    Prior Therapy none      Observation/Other Assessments   Focus on Therapeutic Outcomes (FOTO)  59      AROM   Left Shoulder Extension 40  Degrees    Left Shoulder Flexion 140 Degrees   end range pain   Left Shoulder ABduction 150 Degrees   painful mid to end range   Left Shoulder Internal Rotation --   hand to sacrum   Left Shoulder External Rotation 68 Degrees   shoulder 90/elbow 90 - painful     Palpation   Palpation comment muscular tightness Lt > Rt thoracic paraspinals;  pecs; upper trap; leveator; biceps; deltoid; infraspinitus                           [facility] Adult PT Treatment/Exercise - 10/07/21 0001       Shoulder Exercises: Standing   External Rotation Strengthening;Left;10 reps;Theraband    Theraband Level (Shoulder External Rotation) Level 3 (Green)    Internal Rotation Strengthening;Left;10 reps;Theraband    Theraband Level (Shoulder Internal Rotation) Level 3 (Green)    Flexion AAROM;Left;Right    Flexion Limitations  stepping through doorway dowel overhead to stretch into shoulder flexion    Extension Strengthening;Both;10 reps;Theraband    Theraband Level (Shoulder Extension) Level 4 (Blue)    Row Strengthening;Both;20 reps;Theraband    Theraband Level (Shoulder Row) Level 4 (Blue)    Row Limitations bow and arrow x 10 each side blue TB    Retraction Strengthening;Both;20 reps;Theraband    Theraband Level (Shoulder Retraction) Level 2 (Red)    Retraction Limitations W red TB x 10    Other Standing Exercises antirotation green TB x 10 each side      Shoulder Exercises: Pulleys   Flexion Limitations 10 reps x 10 sec hold    Scaption Limitations 10 reps x 10 sec hold    Other Pulley Exercises shoulder horizontal ab/add with pulley x 10 reps      Shoulder Exercises: Stretch   Internal Rotation Stretch Limitations lat stretch 30 sec x 3 reps supine    Other Shoulder Stretches doorway 3 positions placing hands on doorway not stepping in 30 sec x 2 rep each - tightest mid    Other Shoulder Stretches biceps stretch 20 sec x 3 reps      Moist Heat Therapy   Number Minutes Moist Heat 10  Minutes    Moist Heat Location Shoulder      Manual Therapy   Soft tissue mobilization deep tissue work through the Lt shoulder girdle anterior shoulder/pecs; upper trap; leveator; biceps; SCM    Myofascial Release posterior shoulder girdle    Passive ROM Lt shoulder flexion; horizontal abduction; extension; sidelying shd abduction and IR hand behind back    Other Manual Therapy manual traction through the long arm post treatment                     PT Education - 10/07/21 1116     Education Details HEP    Person(s) Educated Patient    Methods Explanation;Demonstration;Tactile cues;Verbal cues;Handout    Comprehension Verbalized understanding;Returned demonstration;Verbal cues required;Tactile cues required;Need further instruction                 PT Long Term Goals - 08/27/21 1556       PT LONG TERM GOAL #1   Title Decrease Lt shoulder pain by 75-100% with elevation and functional activities    Time 8    Period Weeks    Status New    Target Date 10/22/21      PT LONG TERM GOAL #2   Title Improve posture and alignment with patient to demonstrate improved upright posture with posterior shoudler girdle engaged    Time 8    Period Weeks    Status New    Target Date 10/22/21      PT LONG TERM GOAL #3   Title Increase AROM Lt shoulder to +/> than AROM Rt shoulder    Time 8    Period Weeks    Status New      PT LONG TERM GOAL #4   Title Independnt in HEP    Time 8    Period Weeks    Status New    Target Date 10/22/21      PT LONG TERM GOAL #5   Title Iprove functional limitation score to 65    Time 8    Period Weeks    Status New    Target Date 10/22/21  Plan - 10/07/21 1110     Clinical Impression Statement Some increased tightness and spasm in the posterior shoulder girdle after bending over to pick up a ball on Saturday. Working on exercises at home. Continued to progress with strengthening and stabilization for Lt  shoulder.    Rehab Potential Good    PT Frequency 2x / week    PT Duration 8 weeks    PT Treatment/Interventions ADLs/Self Care Home Management;Aquatic Therapy;Cryotherapy;Electrical Stimulation;Iontophoresis 4mg /ml Dexamethasone;Moist Heat;Ultrasound;Functional mobility training;Therapeutic activities;Therapeutic exercise;Neuromuscular re-education;Patient/family education;Manual techniques;Passive range of motion;Dry needling;Taping    PT Next Visit Plan review and progress HEP; continue work on posture and alignment; continue manual work (pt does not want to try DN); continue PROM; stretching; posterior shoulder girdle strengthening; modalities as indicated; continue DN as indicated Add wt bearing exercises UE's    PT Home Exercise Plan    Consulted and Agree with Plan of Care Patient             Patient will benefit from skilled therapeutic intervention in order to improve the following deficits and impairments:     Visit Diagnosis: Chronic left shoulder pain  Abnormal posture  Other symptoms and signs involving the musculoskeletal system     Problem List Patient Active Problem List   Diagnosis Date Noted   Chronic left shoulder pain 07/25/2021   Asymmetrical hearing loss 12/12/2020   Type 2 diabetes mellitus with other specified complication (HCC) 07/21/2017   Diastolic dysfunction 06/12/2015   Metacarpophalangeal joint pain of right hand 06/05/2013   PVC's (premature ventricular contractions) 06/05/2013   Pulmonary hypertension (HCC) 03/09/2013   Atrial flutter with rapid ventricular response (HCC) 02/23/2013   Trigger finger, acquired 08/02/2012   Mitral valve disease 10/13/2011   Allergy to IVP dye 08/27/2011   Long term current use of anticoagulant 08/27/2011   Normal coronary arteries 08/27/2011   Rheumatic heart disease 08/27/2011   MITRAL VALVE REPLACEMENT, HX OF 08/05/2006   Aortic valve replaced 08/05/2006   Hyperlipidemia 11/24/2005   Essential  hypertension 11/24/2005    Margie Brink 01/24/2006, PT, MPH  10/07/2021, 11:51 AM  [facility] [address] [place], [place], [place] Phone: [phone]   Fax:  [phone]  Name: Erin Hill MRN: [id number] [id number] Date of Birth: [birth year]

## 2021-10-07 NOTE — Patient Instructions (Signed)
Access Code: [email or web address] URL: [email or web address] Date: 10/07/2021 Prepared by: Corlis Leak  Exercises - Seated Cervical Retraction  - 3 x daily - 7 x weekly - 1 sets - 10 reps - Standing Scapular Retraction  - 3 x daily - 7 x weekly - 1 sets - 10 reps - 10 hold - Shoulder External Rotation and Scapular Retraction  - 3 x daily - 7 x weekly - 1 sets - 10 reps -   hold - Shoulder External Rotation in 45 Degrees Abduction  - 2 x daily - 7 x weekly - 1-2 sets - 10 reps - 3 sec  hold - Doorway Pec Stretch at 60 Degrees Abduction  - 3 x daily - 7 x weekly - 1 sets - 3 reps - Doorway Pec Stretch at 90 Degrees Abduction  - 3 x daily - 7 x weekly - 1 sets - 3 reps - 30 seconds  hold - Doorway Pec Stretch at 120 Degrees Abduction  - 3 x daily - 7 x weekly - 1 sets - 3 reps - 30 second hold  hold - Seated Shoulder Flexion AAROM with Pulley Behind  - 2 x daily - 7 x weekly - 1 sets - 10 reps - 10 sec  hold - Seated Shoulder Scaption AAROM with Pulley at Side  - 2 x daily - 7 x weekly - 1 sets - 10 reps - 10sec  hold - Supine Chest Stretch on Foam Roll  - 2 x daily - 7 x weekly - 1 sets - 1 reps - 2-5 min  sec  hold - Supine Lower Trunk Rotation  - 2 x daily - 7 x weekly - 1 sets - 3-5 reps - 20-30 sec  hold - Prone Scapular Retraction  - 1 x daily - 7 x weekly - 1-3 sets - 10 reps - 5-10 sec  hold - Shoulder External Rotation and Scapular Retraction with Resistance  - 3-4 x daily - 7 x weekly - 2-3 sets - 10 reps - 3-5 sec  hold - Standing Bilateral Low Shoulder Row with Anchored Resistance  - 2 x daily - 7 x weekly - 1-3 sets - 10 reps - 2-3 sec  hold - Drawing Bow  - 1 x daily - 7 x weekly - 1 sets - 10 reps - 3 sec  hold - Bicep Stretch at Table  - 2 x daily - 7 x weekly - 1 sets - 3 reps - 30 sec  hold - Shoulder W - External Rotation with Resistance  - 2 x daily - 7 x weekly - 1-2 sets - 10 reps - 3 sec  hold - Shoulder extension with resistance - Neutral  - 1 x daily - 7 x weekly - 1-2  sets - 10 reps - 3-5 sec  hold - Standing Overhead Shoulder External Rotation Stretch with Towel  - 2 x daily - 7 x weekly - 1 sets - 3-5 reps - 10-30 sec  hold - Shoulder ER Stretch in Abduction  - 2 x daily - 7 x weekly - 1 sets - 3 reps - 30 sec  hold - Standing Thoracic Open Book at Wall  - 2 x daily - 7 x weekly - 1 sets - 3 reps - 30 sec  hold - Shoulder External Rotation Reactive Isometrics  - 1 x daily - 7 x weekly - 1 sets - 5-10 reps - 10-30 sec  hold - Shoulder Internal Rotation Reactive Isometrics  -  2 x daily - 7 x weekly - 1 sets - 10 reps - 3-5 sec  hold - Anti-Rotation Lateral Stepping with Press  - 2 x daily - 7 x weekly - 1-2 sets - 10 reps - 2-3 sec  hold

## 2021-10-07 NOTE — Assessment & Plan Note (Signed)
This pleasant 66 year old female returns, she does have a high-grade partial-thickness tear, greater than 50% of the supraspinatus footprint. She has been doing physical therapy and has improved considerably. She does have a little of loss of external rotation on the left consistent with adhesive capsulitis considering no chondral abnormalities on the MRI. She is happy with how things are going so we will continue with a home exercise program and can return to see me as needed.  If she does end up with a recurrence of pain or desires an injection we would likely do glenohumeral.

## 2021-10-08 ENCOUNTER — Ambulatory Visit: Payer: Medicare Other | Admitting: Rehabilitative and Restorative Service Providers"

## 2021-10-08 ENCOUNTER — Encounter: Payer: Self-pay | Admitting: Rehabilitative and Restorative Service Providers"

## 2021-10-08 DIAGNOSIS — R29898 Other symptoms and signs involving the musculoskeletal system: Secondary | ICD-10-CM

## 2021-10-08 DIAGNOSIS — R293 Abnormal posture: Secondary | ICD-10-CM

## 2021-10-08 DIAGNOSIS — M25512 Pain in left shoulder: Secondary | ICD-10-CM | POA: Diagnosis not present

## 2021-10-08 DIAGNOSIS — G8929 Other chronic pain: Secondary | ICD-10-CM

## 2021-10-08 LAB — BASIC METABOLIC PANEL WITH GFR
BUN/Creatinine Ratio: 26 (calc) — ABNORMAL HIGH (ref 6–22)
BUN: 27 mg/dL — ABNORMAL HIGH (ref 7–25)
CO2: 27 mmol/L (ref 20–32)
Calcium: 9.6 mg/dL (ref 8.6–10.4)
Chloride: 106 mmol/L (ref 98–110)
Creat: 1.03 mg/dL (ref 0.50–1.05)
Glucose, Bld: 140 mg/dL — ABNORMAL HIGH (ref 65–99)
Potassium: 4.9 mmol/L (ref 3.5–5.3)
Sodium: 140 mmol/L (ref 135–146)
eGFR: 60 mL/min/{1.73_m2} (ref >=60)

## 2021-10-08 LAB — HEMOGLOBIN A1C
Hgb A1c MFr Bld: 6.5 % of total Hgb — ABNORMAL HIGH (ref <5.7)
Mean Plasma Glucose: 140 mg/dL
eAG (mmol/L): 7.7 mmol/L

## 2021-10-08 NOTE — Progress Notes (Signed)
HI Kizzie,  A1C is a little better this time.  Great work!  Electrolytes are normal.

## 2021-10-08 NOTE — Patient Instructions (Signed)
Access Code: [email or web address] URL: [email or web address] Date: 10/08/2021 Prepared by: Corlis Leak  Exercises - Seated Cervical Retraction  - 3 x daily - 7 x weekly - 1 sets - 10 reps - Standing Scapular Retraction  - 3 x daily - 7 x weekly - 1 sets - 10 reps - 10 hold - Shoulder External Rotation and Scapular Retraction  - 3 x daily - 7 x weekly - 1 sets - 10 reps -   hold - Shoulder External Rotation in 45 Degrees Abduction  - 2 x daily - 7 x weekly - 1-2 sets - 10 reps - 3 sec  hold - Doorway Pec Stretch at 60 Degrees Abduction  - 3 x daily - 7 x weekly - 1 sets - 3 reps - Doorway Pec Stretch at 90 Degrees Abduction  - 3 x daily - 7 x weekly - 1 sets - 3 reps - 30 seconds  hold - Doorway Pec Stretch at 120 Degrees Abduction  - 3 x daily - 7 x weekly - 1 sets - 3 reps - 30 second hold  hold - Seated Shoulder Flexion AAROM with Pulley Behind  - 2 x daily - 7 x weekly - 1 sets - 10 reps - 10 sec  hold - Seated Shoulder Scaption AAROM with Pulley at Side  - 2 x daily - 7 x weekly - 1 sets - 10 reps - 10sec  hold - Supine Chest Stretch on Foam Roll  - 2 x daily - 7 x weekly - 1 sets - 1 reps - 2-5 min  sec  hold - Supine Lower Trunk Rotation  - 2 x daily - 7 x weekly - 1 sets - 3-5 reps - 20-30 sec  hold - Prone Scapular Retraction  - 1 x daily - 7 x weekly - 1-3 sets - 10 reps - 5-10 sec  hold - Shoulder External Rotation and Scapular Retraction with Resistance  - 3-4 x daily - 7 x weekly - 2-3 sets - 10 reps - 3-5 sec  hold - Standing Bilateral Low Shoulder Row with Anchored Resistance  - 2 x daily - 7 x weekly - 1-3 sets - 10 reps - 2-3 sec  hold - Drawing Bow  - 1 x daily - 7 x weekly - 1 sets - 10 reps - 3 sec  hold - Bicep Stretch at Table  - 2 x daily - 7 x weekly - 1 sets - 3 reps - 30 sec  hold - Shoulder W - External Rotation with Resistance  - 2 x daily - 7 x weekly - 1-2 sets - 10 reps - 3 sec  hold - Shoulder extension with resistance - Neutral  - 1 x daily - 7 x weekly - 1-2  sets - 10 reps - 3-5 sec  hold - Standing Overhead Shoulder External Rotation Stretch with Towel  - 2 x daily - 7 x weekly - 1 sets - 3-5 reps - 10-30 sec  hold - Shoulder ER Stretch in Abduction  - 2 x daily - 7 x weekly - 1 sets - 3 reps - 30 sec  hold - Standing Thoracic Open Book at Wall  - 2 x daily - 7 x weekly - 1 sets - 3 reps - 30 sec  hold - Shoulder External Rotation Reactive Isometrics  - 1 x daily - 7 x weekly - 1 sets - 5-10 reps - 10-30 sec  hold - Shoulder Internal Rotation Reactive Isometrics  -  2 x daily - 7 x weekly - 1 sets - 10 reps - 3-5 sec  hold - Anti-Rotation Lateral Stepping with Press  - 2 x daily - 7 x weekly - 1-2 sets - 10 reps - 2-3 sec  hold - Standing Lat Pull Down with Resistance - Elbows Bent  - 2 x daily - 7 x weekly - 1 sets - 10 reps - 3 sec  hold

## 2021-10-08 NOTE — Therapy (Signed)
[facility] [place] [address] [place], [place], [address] Phone: [phone]   Fax:  [phone]  Physical Therapy Treatment Rationale for Evaluation and Treatment Rehabilitation  Patient Details  Name: Erin Hill MRN: [id number] Date of Birth: [birth year] Referring Provider (PT): Dr Benjamin Stain   Encounter Date: 10/08/2021   PT End of Session - 10/08/21 1401     Visit Number 12    Number of Visits 16    Date for PT Re-Evaluation 10/22/21             Past Medical History:  Diagnosis Date   Atrial flutter (HCC)    2015   Hearing loss    chronic   Miscarriage    X 2   Proteinuria    chronic (not related to DM)   Rheumatic heart disease    s/p aortic/ mitral valve replace    Past Surgical History:  Procedure Laterality Date   double heart valve replacement  1999   echo EF 75-80%     SAB     X 2   stapes surgery, left  2003   then removed 2 years later   TAB      X 1   TONSILLECTOMY  as a child    There were no vitals filed for this visit.   Subjective Assessment - 10/08/21 1401     Subjective Shoulder is feeling "really good". No pain. Please with progress    Pain Score 0-No pain                               [facility] Adult PT Treatment/Exercise - 10/08/21 0001       Shoulder Exercises: Standing   Row Strengthening;Both;20 reps;Theraband    Theraband Level (Shoulder Row) Level 4 (Blue)    Row Limitations bow and arrow x 10 each side blue TB    Other Standing Exercises antirotation blue TB x 10 each side    Other Standing Exercises lat pull blue x 10 reps      Shoulder Exercises: Pulleys   Flexion Limitations 10 reps x 10 sec hold    Scaption Limitations 10 reps x 10 sec hold    Other Pulley Exercises shoulder horizontal ab/add with pulley x 10 reps      Shoulder Exercises: Stretch   Internal Rotation Stretch Limitations lat stretch 30 sec x 3 reps supine    Other  Shoulder Stretches doorway 3 positions placing hands on doorway not stepping in 30 sec x 2 rep each - tightest mid    Other Shoulder Stretches biceps stretch 20 sec x 3 reps      Moist Heat Therapy   Number Minutes Moist Heat 10 Minutes    Moist Heat Location Shoulder      Manual Therapy   Soft tissue mobilization deep tissue work through the Lt shoulder girdle anterior shoulder/pecs; upper trap; leveator; biceps; SCM    Myofascial Release posterior shoulder girdle    Passive ROM Lt shoulder flexion; horizontal abduction; extension; sidelying shd abduction and IR hand behind back    Other Manual Therapy manual traction through the long arm post treatment                     PT Education - 10/08/21 1405     Education Details HEP    Person(s) Educated Patient    Methods Explanation;Demonstration;Tactile cues;Verbal cues;Handout  Comprehension Verbalized understanding;Returned demonstration;Verbal cues required;Tactile cues required                 PT Long Term Goals - 08/27/21 1556       PT LONG TERM GOAL #1   Title Decrease Lt shoulder pain by 75-100% with elevation and functional activities    Time 8    Period Weeks    Status New    Target Date 10/22/21      PT LONG TERM GOAL #2   Title Improve posture and alignment with patient to demonstrate improved upright posture with posterior shoudler girdle engaged    Time 8    Period Weeks    Status New    Target Date 10/22/21      PT LONG TERM GOAL #3   Title Increase AROM Lt shoulder to +/> than AROM Rt shoulder    Time 8    Period Weeks    Status New      PT LONG TERM GOAL #4   Title Independnt in HEP    Time 8    Period Weeks    Status New    Target Date 10/22/21      PT LONG TERM GOAL #5   Title Iprove functional limitation score to 65    Time 8    Period Weeks    Status New    Target Date 10/22/21                   Plan - 10/08/21 1402     Clinical Impression Statement  Improving with less tightness and spasm in the posterior shoulder girdle today. Continued joint tightness at end ranges. Good response to treatment with continued progress toward goals.    Rehab Potential Good    PT Frequency 2x / week    PT Duration 8 weeks    PT Treatment/Interventions ADLs/Self Care Home Management;Aquatic Therapy;Cryotherapy;Electrical Stimulation;Iontophoresis 4mg /ml Dexamethasone;Moist Heat;Ultrasound;Functional mobility training;Therapeutic activities;Therapeutic exercise;Neuromuscular re-education;Patient/family education;Manual techniques;Passive range of motion;Dry needling;Taping    PT Next Visit Plan review and progress HEP; continue work on posture and alignment; continue manual work (pt does not want to try DN); continue PROM; stretching; posterior shoulder girdle strengthening; modalities as indicated; continue DN as indicated Add wt bearing exercises UE's    PT Home Exercise Plan    Consulted and Agree with Plan of Care Patient             Patient will benefit from skilled therapeutic intervention in order to improve the following deficits and impairments:     Visit Diagnosis: Chronic left shoulder pain  Abnormal posture  Other symptoms and signs involving the musculoskeletal system     Problem List Patient Active Problem List   Diagnosis Date Noted   Chronic left shoulder pain 07/25/2021   Asymmetrical hearing loss 12/12/2020   Type 2 diabetes mellitus with other specified complication (HCC) 07/21/2017   Diastolic dysfunction 06/12/2015   Metacarpophalangeal joint pain of right hand 06/05/2013   PVC's (premature ventricular contractions) 06/05/2013   Pulmonary hypertension (HCC) 03/09/2013   Atrial flutter with rapid ventricular response (HCC) 02/23/2013   Trigger finger, acquired 08/02/2012   Mitral valve disease 10/13/2011   Allergy to IVP dye 08/27/2011   Long term current use of anticoagulant 08/27/2011   Normal coronary  arteries 08/27/2011   Rheumatic heart disease 08/27/2011   MITRAL VALVE REPLACEMENT, HX OF 08/05/2006   Aortic valve replaced 08/05/2006   Hyperlipidemia 11/24/2005   Essential hypertension  11/24/2005    Mahdi Frye Nilda Simmer, PT, MPH  10/08/2021, 2:39 PM  [facility] [place] [address] [place], [place], [address] Phone: [phone]   Fax:  [phone]  Name: Erin Hill MRN: [id number] Date of Birth: 1955/04/27

## 2021-10-09 ENCOUNTER — Encounter: Payer: Self-pay | Admitting: Rehabilitative and Restorative Service Providers"

## 2021-10-15 ENCOUNTER — Ambulatory Visit: Payer: Medicare Other | Admitting: Rehabilitative and Restorative Service Providers"

## 2021-10-15 ENCOUNTER — Encounter: Payer: Self-pay | Admitting: Rehabilitative and Restorative Service Providers"

## 2021-10-15 DIAGNOSIS — R29898 Other symptoms and signs involving the musculoskeletal system: Secondary | ICD-10-CM

## 2021-10-15 DIAGNOSIS — R293 Abnormal posture: Secondary | ICD-10-CM

## 2021-10-15 DIAGNOSIS — M25512 Pain in left shoulder: Secondary | ICD-10-CM | POA: Diagnosis not present

## 2021-10-15 DIAGNOSIS — G8929 Other chronic pain: Secondary | ICD-10-CM

## 2021-10-15 NOTE — Patient Instructions (Signed)
Access Code: [email or web address] URL: [email or web address] Date: 10/15/2021 Prepared by: Corlis Leak  Exercises - Seated Cervical Retraction  - 3 x daily - 7 x weekly - 1 sets - 10 reps - Standing Scapular Retraction  - 3 x daily - 7 x weekly - 1 sets - 10 reps - 10 hold - Shoulder External Rotation and Scapular Retraction  - 3 x daily - 7 x weekly - 1 sets - 10 reps -   hold - Shoulder External Rotation in 45 Degrees Abduction  - 2 x daily - 7 x weekly - 1-2 sets - 10 reps - 3 sec  hold - Doorway Pec Stretch at 60 Degrees Abduction  - 3 x daily - 7 x weekly - 1 sets - 3 reps - Doorway Pec Stretch at 90 Degrees Abduction  - 3 x daily - 7 x weekly - 1 sets - 3 reps - 30 seconds  hold - Doorway Pec Stretch at 120 Degrees Abduction  - 3 x daily - 7 x weekly - 1 sets - 3 reps - 30 second hold  hold - Seated Shoulder Flexion AAROM with Pulley Behind  - 2 x daily - 7 x weekly - 1 sets - 10 reps - 10 sec  hold - Seated Shoulder Scaption AAROM with Pulley at Side  - 2 x daily - 7 x weekly - 1 sets - 10 reps - 10sec  hold - Supine Chest Stretch on Foam Roll  - 2 x daily - 7 x weekly - 1 sets - 1 reps - 2-5 min  sec  hold - Supine Lower Trunk Rotation  - 2 x daily - 7 x weekly - 1 sets - 3-5 reps - 20-30 sec  hold - Prone Scapular Retraction  - 1 x daily - 7 x weekly - 1-3 sets - 10 reps - 5-10 sec  hold - Shoulder External Rotation and Scapular Retraction with Resistance  - 3-4 x daily - 7 x weekly - 2-3 sets - 10 reps - 3-5 sec  hold - Standing Bilateral Low Shoulder Row with Anchored Resistance  - 2 x daily - 7 x weekly - 1-3 sets - 10 reps - 2-3 sec  hold - Drawing Bow  - 1 x daily - 7 x weekly - 1 sets - 10 reps - 3 sec  hold - Bicep Stretch at Table  - 2 x daily - 7 x weekly - 1 sets - 3 reps - 30 sec  hold - Shoulder W - External Rotation with Resistance  - 2 x daily - 7 x weekly - 1-2 sets - 10 reps - 3 sec  hold - Shoulder extension with resistance - Neutral  - 1 x daily - 7 x weekly - 1-2  sets - 10 reps - 3-5 sec  hold - Standing Overhead Shoulder External Rotation Stretch with Towel  - 2 x daily - 7 x weekly - 1 sets - 3-5 reps - 10-30 sec  hold - Shoulder ER Stretch in Abduction  - 2 x daily - 7 x weekly - 1 sets - 3 reps - 30 sec  hold - Standing Thoracic Open Book at Wall  - 2 x daily - 7 x weekly - 1 sets - 3 reps - 30 sec  hold - Shoulder External Rotation Reactive Isometrics  - 1 x daily - 7 x weekly - 1 sets - 5-10 reps - 10-30 sec  hold - Shoulder Internal Rotation Reactive Isometrics  -  2 x daily - 7 x weekly - 1 sets - 10 reps - 3-5 sec  hold - Anti-Rotation Lateral Stepping with Press  - 2 x daily - 7 x weekly - 1-2 sets - 10 reps - 2-3 sec  hold - Standing Lat Pull Down with Resistance - Elbows Bent  - 2 x daily - 7 x weekly - 1 sets - 10 reps - 3 sec  hold - Wall Push Up  - 1 x daily - 7 x weekly - 1-3 sets - 10 reps - 3 sec  hold - Scaption with Dumbbells  - 1 x daily - 7 x weekly - 1-3 sets - 10 reps - 3 sec  hold - Standing Bent Over Single Arm Scapular Row with Table Support  - 1 x daily - 7 x weekly - 1-3 sets - 10 reps - 3 sec  hold - Standing Bent Over Triceps Extension  - 1 x daily - 7 x weekly - 1-3 sets - 10 reps - 30sec  hold

## 2021-10-15 NOTE — Therapy (Addendum)
[facility] [place] [address] [place], [place], [address] Phone: [phone]   Fax:  [phone]  Physical Therapy Treatment  PHYSICAL THERAPY DISCHARGE SUMMARY  Visits from Start of Care: 13  Current functional level related to goals / functional outcomes: See progress note for discharge status.   Remaining deficits: Needs to continue with exercises   Education / Equipment: HEP   Patient agrees to discharge. Patient goals were met. Patient is being discharged due to meeting the stated rehab goals. Waldine Zenz P. Leonor Liv PT, MPH 11/06/21 5:04 PM  Rationale for Evaluation and Treatment Rehabilitation    Patient Details  Name: Erin Hill MRN: [id number] Date of Birth: [birth year] Referring Provider (PT): Dr Benjamin Stain   Encounter Date: 10/15/2021   PT End of Session - 10/15/21 1016     Visit Number 13    Number of Visits 16    Date for PT Re-Evaluation 10/22/21    PT Start Time 1015    PT Stop Time 1103    PT Time Calculation (min) 48 min    Activity Tolerance Patient tolerated treatment well             Past Medical History:  Diagnosis Date   Atrial flutter (HCC)    2015   Hearing loss    chronic   Miscarriage    X 2   Proteinuria    chronic (not related to DM)   Rheumatic heart disease    s/p aortic/ mitral valve replace    Past Surgical History:  Procedure Laterality Date   double heart valve replacement  1999   echo EF 75-80%     SAB     X 2   stapes surgery, left  2003   then removed 2 years later   TAB      X 1   TONSILLECTOMY  as a child    There were no vitals filed for this visit.   Subjective Assessment - 10/15/21 1019     Subjective Shoulder is feeling "really good". No pain. Has an occasional "twinge" - most often following work. Working on home program.  Pleased with progress    Currently in Pain? No/denies    Pain Score 0-No pain                [facility] PT Assessment -  10/15/21 0001       Assessment   Medical Diagnosis Chronic Lt shoulder pain    Referring Provider (PT) Dr Benjamin Stain    Onset Date/Surgical Date 11/16/21    Hand Dominance Right    Next MD Visit PRN    Prior Therapy none      Observation/Other Assessments   Focus on Therapeutic Outcomes (FOTO)  59      AROM   Left Shoulder Extension 40 Degrees    Left Shoulder Flexion 140 Degrees   end range pain   Left Shoulder ABduction 150 Degrees   painful mid to end range   Left Shoulder Internal Rotation --   hand to sacrum   Left Shoulder External Rotation 68 Degrees   shoulder 90/elbow 90 - painful     Palpation   Palpation comment minimal muscular tightness Lt > Rt thoracic paraspinals;  pecs; upper trap; leveator; biceps; deltoid; infraspinitus                           [facility] Adult PT Treatment/Exercise - 10/15/21 0001  Shoulder Exercises: Standing   External Rotation Strengthening;Left;10 reps;Theraband    Theraband Level (Shoulder External Rotation) Level 4 (Blue)    Internal Rotation Strengthening;Left;10 reps;Theraband    Theraband Level (Shoulder Internal Rotation) Level 4 (Blue)    Extension Strengthening;Both;10 reps;Theraband    Theraband Level (Shoulder Extension) Level 4 (Blue)    Row Strengthening;Both;20 reps;Theraband    Theraband Level (Shoulder Row) Level 4 (Blue)    Row Limitations bow and arrow x 10 each side blue TB    Retraction Strengthening;Both;20 reps;Theraband    Theraband Level (Shoulder Retraction) Level 2 (Red)    Retraction Limitations W red TB x 10    Other Standing Exercises antirotation blue TB x 10 each side    Other Standing Exercises lat pull blue x 10 reps      Shoulder Exercises: ROM/Strengthening   UBE (Upper Arm Bike) L2 x 4 min alternating fwd/back    Wall Pushups 10 reps    Wall Pushups Limitations push up - clap and catch for dynamic strengthening    Other ROM/Strengthening Exercises shoulder scaption 2# DB  bilat x 10 reps    Other ROM/Strengthening Exercises bent row 3# Lt x 10; triceps extension in bent position 3# x 10      Shoulder Exercises: Stretch   Internal Rotation Stretch Limitations lat stretch 30 sec x 3 reps supine    Other Shoulder Stretches doorway 3 positions placing hands on doorway not stepping in 30 sec x 2 rep each - tightest mid    Other Shoulder Stretches biceps stretch 20 sec x 3 reps      Moist Heat Therapy   Number Minutes Moist Heat 10 Minutes    Moist Heat Location Shoulder      Manual Therapy   Soft tissue mobilization deep tissue work through the Lt shoulder girdle anterior shoulder/pecs; upper trap; leveator; biceps; SCM    Myofascial Release posterior shoulder girdle    Passive ROM Lt shoulder flexion; horizontal abduction; extension; sidelying shd abduction and IR hand behind back    Other Manual Therapy manual traction through the long arm post treatment                          PT Long Term Goals - 10/15/21 1024       PT LONG TERM GOAL #1   Title Decrease Lt shoulder pain by 75-100% with elevation and functional activities    Time 8    Period Weeks    Status Achieved    Target Date 10/22/21      PT LONG TERM GOAL #2   Title Improve posture and alignment with patient to demonstrate improved upright posture with posterior shoudler girdle engaged    Time 8    Period Weeks    Status Achieved      PT LONG TERM GOAL #3   Title Increase AROM Lt shoulder to +/> than AROM Rt shoulder    Time 8    Period Weeks    Status Achieved      PT LONG TERM GOAL #4   Title Independnt in HEP    Time 8    Period Weeks    Status Achieved    Target Date 10/22/21      PT LONG TERM GOAL #5   Title Iprove functional limitation score to 65    Time 8    Period Weeks    Status Achieved    Target Date 10/22/21  Plan - 10/15/21 1020     Clinical Impression Statement Continued improvement with decreased tightness to  palpation - some continued tightness through the posterior shoulder girdle. Excellent progress overall with good ROM and progressing strength.    Rehab Potential Good    PT Frequency 2x / week    PT Duration 8 weeks    PT Treatment/Interventions ADLs/Self Care Home Management;Aquatic Therapy;Cryotherapy;Electrical Stimulation;Iontophoresis 4mg /ml Dexamethasone;Moist Heat;Ultrasound;Functional mobility training;Therapeutic activities;Therapeutic exercise;Neuromuscular re-education;Patient/family education;Manual techniques;Passive range of motion;Dry needling;Taping    PT Next Visit Plan Patient will continue with independent HEP and call if she feels she needs to schedule as needed    PT Home Exercise Plan [email or web address]    Consulted and Agree with Plan of Care Patient             Patient will benefit from skilled therapeutic intervention in order to improve the following deficits and impairments:     Visit Diagnosis: Chronic left shoulder pain  Abnormal posture  Other symptoms and signs involving the musculoskeletal system     Problem List Patient Active Problem List   Diagnosis Date Noted   Chronic left shoulder pain 07/25/2021   Asymmetrical hearing loss 12/12/2020   Type 2 diabetes mellitus with other specified complication ([place]) 81/85/9093   Diastolic dysfunction 01/07/6243   Metacarpophalangeal joint pain of right hand 06/05/2013   PVC's (premature ventricular contractions) 06/05/2013   Pulmonary hypertension ([place]) 03/09/2013   Atrial flutter with rapid ventricular response ([place]) 02/23/2013   Trigger finger, acquired 08/02/2012   Mitral valve disease 10/13/2011   Allergy to IVP dye 08/27/2011   Long term current use of anticoagulant 08/27/2011   Normal coronary arteries 08/27/2011   Rheumatic heart disease 08/27/2011   MITRAL VALVE REPLACEMENT, HX OF 08/05/2006   Aortic valve replaced 08/05/2006   Hyperlipidemia 11/24/2005   Essential hypertension 11/24/2005    Dorotea Hand  Nilda Simmer, PT, MPH  10/15/2021, 11:06 AM  [facility] [place] [address] [place], [place], [address] Phone: [phone]   Fax:  [phone]  Name: Erin Hill MRN: [id number] Date of Birth: 11-04-1955

## 2021-10-17 ENCOUNTER — Other Ambulatory Visit: Payer: Self-pay | Admitting: Family Medicine

## 2021-10-17 DIAGNOSIS — I10 Essential (primary) hypertension: Secondary | ICD-10-CM

## 2021-10-22 ENCOUNTER — Encounter: Payer: Self-pay | Admitting: Family Medicine

## 2021-10-23 NOTE — Telephone Encounter (Signed)
See other message

## 2021-10-24 ENCOUNTER — Ambulatory Visit: Payer: Self-pay | Admitting: Family Medicine

## 2021-10-24 DIAGNOSIS — Z7901 Long term (current) use of anticoagulants: Secondary | ICD-10-CM

## 2021-10-24 DIAGNOSIS — Z9889 Other specified postprocedural states: Secondary | ICD-10-CM

## 2021-10-24 DIAGNOSIS — Z952 Presence of prosthetic heart valve: Secondary | ICD-10-CM

## 2021-10-24 LAB — POCT INR: INR: 2.6 (ref 2.0–3.0)

## 2021-10-24 NOTE — Progress Notes (Signed)
Flow sheet set up for coumadin.

## 2021-11-04 ENCOUNTER — Encounter: Payer: Self-pay | Admitting: Family Medicine

## 2021-11-04 ENCOUNTER — Ambulatory Visit (INDEPENDENT_AMBULATORY_CARE_PROVIDER_SITE_OTHER): Payer: Medicare Other | Admitting: Family Medicine

## 2021-11-04 VITALS — BP 136/62 | HR 50 | Wt 156.0 lb

## 2021-11-04 DIAGNOSIS — Z9889 Other specified postprocedural states: Secondary | ICD-10-CM

## 2021-11-04 DIAGNOSIS — Z0000 Encounter for general adult medical examination without abnormal findings: Secondary | ICD-10-CM

## 2021-11-04 DIAGNOSIS — I10 Essential (primary) hypertension: Secondary | ICD-10-CM

## 2021-11-04 DIAGNOSIS — Z23 Encounter for immunization: Secondary | ICD-10-CM | POA: Diagnosis not present

## 2021-11-04 DIAGNOSIS — E785 Hyperlipidemia, unspecified: Secondary | ICD-10-CM | POA: Diagnosis not present

## 2021-11-04 LAB — PROTIME-INR

## 2021-11-04 LAB — POCT INR: INR: 2.8 (ref 2–3)

## 2021-11-04 NOTE — Addendum Note (Signed)
Addended by: Beatrice Lecher D on: 11/04/2021 05:32 PM   Modules accepted: Orders

## 2021-11-04 NOTE — Patient Instructions (Signed)
  Erin Hill , Thank you for taking time to come for your Medicare Wellness Visit. I appreciate your ongoing commitment to your health goals. Please review the following plan we discussed and let me know if I can assist you in the future.   These are the goals we discussed:  Goals      Exercise 150 min/wk Moderate Activity     Work on getting to the gym 3 days per week consistently.         This is a list of the screening recommended for you and due dates:  Health Maintenance  Topic Date Due   Zoster (Shingles) Vaccine (1 of 2) Never done   Eye exam for diabetics  12/28/2020   Pneumonia Vaccine (2 - PCV) 08/15/2021   DEXA scan (bone density measurement)  Never done   COVID-19 Vaccine (6 - Pfizer series) 08/15/2021   Flu Shot  09/16/2021   Complete foot exam   12/20/2021   Hemoglobin A1C  04/09/2022   Yearly kidney health urinalysis for diabetes  06/28/2022   Yearly kidney function blood test for diabetes  10/08/2022   Mammogram  01/04/2023   Pap Smear  09/18/2024   Tetanus Vaccine  07/11/2025   Colon Cancer Screening  10/24/2028   Hepatitis C Screening: USPSTF Recommendation to screen - Ages 18-79 yo.  Completed   HIV Screening  Completed   HPV Vaccine  Aged Out

## 2021-11-04 NOTE — Progress Notes (Addendum)
Subjective:    Erin Hill is a 66 y.o. female who presents for a Welcome to Medicare exam.   Review of Systems Negative  Cardiac Risk Factors include: hypertension      Objective:    Today's Vitals   11/04/21 1428 11/04/21 1459  BP: (!) 143/45 136/62  Pulse: (!) 50   SpO2: 97%   Weight: 156 lb (70.8 kg)   Body mass index is 28.53 kg/m.  Medications Outpatient Encounter Medications as of 11/04/2021  Medication Sig   b complex vitamins tablet Take 1 tablet by mouth daily.   Blood Glucose Monitoring Suppl (ACCU-CHEK AVIVA PLUS) w/Device KIT Use to check blood sugar twice daily. Dx: R73.01   Coenzyme Q-10 100 MG capsule Take 300 mg by mouth daily.   glucose blood (ACCU-CHEK GUIDE) test strip Check fasting blood sugar every morning. DX: E11.9   Lancets (ACCU-CHEK MULTICLIX) lancets Use to check blood sugar twice daily. Dx: R73.01   metoprolol succinate (TOPROL-XL) 100 MG 24 hr tablet TAKE 1 TABLET BY MOUTH DAILY WITH OR IMMEDIATELY FOLLOWING A MEAL.   Olmesartan-amLODIPine-HCTZ 40-5-25 MG TABS TAKE ONE TABLET BY MOUTH ONE TIME DAILY   OVER THE COUNTER MEDICATION Super C ( vit D, Vit D, zinc)   rosuvastatin (CRESTOR) 10 MG tablet Take 10 mg by mouth daily.   Vitamin K, Phytonadione, 100 MCG TABS Take 100 mcg daily by mouth.   warfarin (COUMADIN) 5 MG tablet TAKE 1 TABLET (5 MG) BY MOUTH DAILY EXCEPT FOR MON/FRI TAKE 1/2 TABLET (2.$RemoveBefor'5MG'JunCVHTeHZxp$ ) OR AS DIRECTED BY COUMADIN CLINIC.   No facility-administered encounter medications on file as of 11/04/2021.     History: Past Medical History:  Diagnosis Date   Atrial flutter ([place])    2015   Hearing loss    chronic   Miscarriage    X 2   Proteinuria    chronic (not related to DM)   Rheumatic heart disease    s/p aortic/ mitral valve replace   Past Surgical History:  Procedure Laterality Date   double heart valve replacement  1999   echo EF 75-80%     SAB     X 2   stapes surgery, left  2003   then removed 2 years later   TAB       X 1   TONSILLECTOMY  as a child    Family History  Problem Relation Age of Onset   Diabetes Father    Lung cancer Mother        smoked until her 38's   Hypertension Mother    Diabetes Father    Hyperlipidemia Father    Hypertension Father    Social History   Occupational History   Occupation: Retail banker: [employer]  Tobacco Use   Smoking status: Never   Smokeless tobacco: Never  Substance and Sexual Activity   Alcohol use: No   Drug use: No   Sexual activity: Yes    Partners: Male    Tobacco Counseling Counseling given: Not Answered   Immunizations and Health Maintenance Immunization History  Administered Date(s) Administered   Fluad Quad(high Dose 65+) 11/04/2021   Hepatitis B 06/08/2005, 07/06/2005, 12/21/2005   Influenza Split 11/17/2011   Influenza Whole 12/08/2005, 12/18/2007, 12/10/2008, 11/16/2009   Influenza,inj,Quad PF,6+ Mos 11/08/2018, 11/03/2019, 12/20/2020   PFIZER Comirnaty(Gray Top)Covid-19 Tri-Sucrose Vaccine 08/10/2020   PFIZER(Purple Top)SARS-COV-2 Vaccination 05/09/2019, 05/30/2019, 01/02/2020   Pfizer Covid-19 Vaccine Bivalent Booster 36yrs & up 02/07/2021   Pneumococcal  Polysaccharide-23 12/28/2005   Td 03/19/2001, 02/16/2005   Tdap 07/12/2015   Varicella 02/25/2005, 12/13/2009   Health Maintenance Due  Topic Date Due   Zoster Vaccines- Shingrix (1 of 2) Never done   OPHTHALMOLOGY EXAM  12/28/2020   DEXA SCAN  Never done   COVID-19 Vaccine (6 - Pfizer series) 08/15/2021    Activities of Daily Living    11/04/2021    2:53 PM  In your present state of health, do you have any difficulty performing the following activities:  Hearing? 1  Comment hearing aid on left  Vision? 0  Difficulty concentrating or making decisions? 0  Walking or climbing stairs? 0  Dressing or bathing? 0  Doing errands, shopping? 0  Preparing Food and eating ? N  Using the Toilet? N  In the past six months, have you accidently  leaked urine? N  Do you have problems with loss of bowel control? N  Managing your Medications? N  Managing your Finances? N  Housekeeping or managing your Housekeeping? N    Physical Exam  Physical Exam Constitutional:      Appearance: She is well-developed.  HENT:     Head: Normocephalic and atraumatic.     Right Ear: External ear normal.     Left Ear: External ear normal.     Nose: Nose normal.  Eyes:     Conjunctiva/sclera: Conjunctivae normal.     Pupils: Pupils are equal, round, and reactive to light.  Neck:     Thyroid: No thyromegaly.  Cardiovascular:     Rate and Rhythm: Normal rate and regular rhythm.     Heart sounds: Normal heart sounds.  Pulmonary:     Effort: Pulmonary effort is normal.     Breath sounds: Normal breath sounds. No wheezing.  Abdominal:     Palpations: Abdomen is soft.     Tenderness: There is no abdominal tenderness.  Musculoskeletal:     Cervical back: Neck supple.  Lymphadenopathy:     Cervical: No cervical adenopathy.  Skin:    General: Skin is warm and dry.  Neurological:     Mental Status: She is alert and oriented to person, place, and time.    (optional), or other factors deemed appropriate based on the beneficiary's medical and social history and current clinical standards.  Advanced Directives:      Assessment:    This is a routine wellness examination for this patient .   Vision/Hearing screen No results found.  Dietary issues and exercise activities discussed:  Frequency (Times/Week): 3   Goals      Exercise 150 min/wk Moderate Activity     Work on getting to the gym 3 days per week consistently.       Depression Screen    11/04/2021    2:27 PM 12/20/2020    1:39 PM 04/02/2020    8:11 AM 12/13/2018    8:47 AM  PHQ 2/9 Scores  PHQ - 2 Score 0 0 0 0     Fall Risk    11/04/2021    2:27 PM  [place] in the past year? 0  Number falls in past yr: 0  Injury with Fall? 0  Risk for fall due to : No  Fall Risks  Follow up Falls evaluation completed    Cognitive Function:        11/04/2021    2:27 PM  6CIT Screen  What Year? 0 points  What month? 0 points  What  time? 0 points  Count back from 20 0 points  Months in reverse 0 points  Repeat phrase 0 points  Total Score 0 points    Patient Care Team: Hali Marry, MD as PCP - General Dorie Rank (Cardiology) Laurence Aly, [place] as Referring Physician (Optometry)     Plan:   Medicare Wellness   I have personally reviewed and noted the following in the patient's chart:   Medical and social history Use of alcohol, tobacco or illicit drugs  Current medications and supplements Functional ability and status Nutritional status Physical activity List of other physicians Hospitalizations, surgeries, and ER visits in previous 12 months Vitals Screenings to include cognitive, depression, and falls Referrals and appointments EKG shows rate of 68 bpm, sinus rhythm with PACs.  In addition, I have reviewed and discussed with patient certain preventive protocols, quality metrics, and best practice recommendations. A written personalized care plan for preventive services as well as general preventive health recommendations were provided to patient.  Orders Placed This Encounter  Procedures   Flu Vaccine QUAD High Dose(Fluad)   Ambulatory referral to Cardiology    Referral Priority:   Routine    Referral Type:   Consultation    Referral Reason:   Specialty Services Required    Requested Specialty:   Cardiology    Number of Visits Requested:   1   EKG 12-Lead          Beatrice Lecher, MD 11/04/2021

## 2021-11-16 ENCOUNTER — Other Ambulatory Visit: Payer: Self-pay | Admitting: Family Medicine

## 2021-11-18 LAB — POCT INR
INR: 2.5 (ref 2.0–3.0)
INR: 2.5 (ref 2–3)

## 2021-11-20 ENCOUNTER — Ambulatory Visit (INDEPENDENT_AMBULATORY_CARE_PROVIDER_SITE_OTHER): Payer: Medicare Other | Admitting: Family Medicine

## 2021-11-20 DIAGNOSIS — Z7901 Long term (current) use of anticoagulants: Secondary | ICD-10-CM

## 2021-11-20 DIAGNOSIS — Z952 Presence of prosthetic heart valve: Secondary | ICD-10-CM

## 2021-11-20 DIAGNOSIS — Z9889 Other specified postprocedural states: Secondary | ICD-10-CM

## 2021-11-20 NOTE — Progress Notes (Signed)
Continue current regimen. Recheck in 2 weeks. Thank you

## 2021-11-20 NOTE — Progress Notes (Signed)
Left message with recommendations.  

## 2021-11-21 ENCOUNTER — Other Ambulatory Visit: Payer: Self-pay

## 2021-11-21 NOTE — Progress Notes (Signed)
Patient advised.

## 2021-11-21 NOTE — Telephone Encounter (Signed)
[facility]/Patient requesting a med refill for rosuvastatin. Per patient, the rx for pravastatin in August was a mistake. Rx written by historical provider. Thanks in advance.

## 2021-11-24 MED ORDER — ROSUVASTATIN CALCIUM 10 MG PO TABS: 10.0000 mg | ORAL_TABLET | Freq: Every day | ORAL | 3 refills | Status: DC

## 2021-11-24 NOTE — Telephone Encounter (Signed)
Meds ordered this encounter  Medications   rosuvastatin (CRESTOR) 10 MG tablet    Sig: Take 1 tablet (10 mg total) by mouth daily.    Dispense:  90 tablet    Refill:  3    

## 2021-11-25 ENCOUNTER — Encounter: Payer: Self-pay | Admitting: Family Medicine

## 2021-11-25 ENCOUNTER — Ambulatory Visit: Payer: Self-pay | Admitting: Family Medicine

## 2021-11-25 ENCOUNTER — Telehealth: Payer: Self-pay | Admitting: Family Medicine

## 2021-11-25 DIAGNOSIS — Z7901 Long term (current) use of anticoagulants: Secondary | ICD-10-CM

## 2021-11-25 DIAGNOSIS — Z9889 Other specified postprocedural states: Secondary | ICD-10-CM

## 2021-11-25 DIAGNOSIS — Z952 Presence of prosthetic heart valve: Secondary | ICD-10-CM

## 2021-11-25 MED ORDER — METOPROLOL SUCCINATE ER 100 MG PO TB24: 100.0000 mg | ORAL_TABLET | Freq: Every day | ORAL | 0 refills | Status: DC

## 2021-11-25 NOTE — Telephone Encounter (Signed)
Medication sent and patient is aware 

## 2021-11-25 NOTE — Telephone Encounter (Signed)
inr

## 2021-11-26 ENCOUNTER — Ambulatory Visit (INDEPENDENT_AMBULATORY_CARE_PROVIDER_SITE_OTHER): Payer: Medicare Other | Admitting: Family Medicine

## 2021-11-26 DIAGNOSIS — Z952 Presence of prosthetic heart valve: Secondary | ICD-10-CM

## 2021-11-26 DIAGNOSIS — Z9889 Other specified postprocedural states: Secondary | ICD-10-CM

## 2021-11-26 DIAGNOSIS — Z7901 Long term (current) use of anticoagulants: Secondary | ICD-10-CM

## 2021-11-26 NOTE — Progress Notes (Signed)
Please call patient and let her know I did receive her [name].  No change to regimen repeat in 2 weeks.  If it drops below 2.5 next time then we will need to make an adjustment.

## 2021-11-27 ENCOUNTER — Telehealth: Payer: Self-pay | Admitting: Neurology

## 2021-11-27 NOTE — Telephone Encounter (Signed)
Hali Marry, MD at 11/26/2021  5:07 PM  Status: Sign when Signing Visit  Please call patient and let her know I did receive her [name].  No change to regimen repeat in 2 weeks.  If it drops below 2.5 next time then we will need to make an adjustment.     LVM for patient to call back to make her aware of above information.

## 2021-12-05 ENCOUNTER — Ambulatory Visit: Payer: Self-pay | Admitting: Family Medicine

## 2021-12-05 DIAGNOSIS — Z952 Presence of prosthetic heart valve: Secondary | ICD-10-CM

## 2021-12-05 DIAGNOSIS — Z9889 Other specified postprocedural states: Secondary | ICD-10-CM

## 2021-12-05 DIAGNOSIS — Z7901 Long term (current) use of anticoagulants: Secondary | ICD-10-CM

## 2021-12-05 LAB — POCT INR: INR: 2.5 (ref 2.0–3.0)

## 2021-12-05 NOTE — Progress Notes (Unsigned)
Call pt:  no change in coumadin.  Recheck 2 weeks. 2.5 [name].

## 2021-12-09 NOTE — Progress Notes (Signed)
Patient informed. 

## 2021-12-10 NOTE — Progress Notes (Signed)
Patient advised.

## 2021-12-30 LAB — HM DEXA SCAN

## 2022-01-01 LAB — POCT INR: INR: 2.5 — AB (ref 0.80–1.20)

## 2022-01-02 ENCOUNTER — Telehealth: Payer: Self-pay | Admitting: Family Medicine

## 2022-01-02 NOTE — Telephone Encounter (Signed)
This, [name] is 2.5.  I will not borderline but were going to keep the level the same and then continue to check every 2 weeks.

## 2022-01-02 NOTE — Telephone Encounter (Signed)
Patient informed. 

## 2022-01-06 LAB — HM MAMMOGRAPHY

## 2022-01-07 ENCOUNTER — Telehealth: Payer: Self-pay | Admitting: Family Medicine

## 2022-01-07 NOTE — Telephone Encounter (Signed)
HI Erin Hill,  Bone density test came back showing a T score of -1.8 consistent with mildly thin bones called osteopenia.   The current recommendation for osteopenia (mildly thin bones) treatment includes:   #1 calcium-total of 1200 mg of calcium daily.  If you eat a very calcium rich diet you may be able to obtain that without a supplement.  If not, then I recommend calcium 500 mg twice a day.  There are several products over-the-counter such as Caltrate D and Viactiv chews which are great options that contain calcium and vitamin D. #2 vitamin D-recommend 800 international units daily. #3 exercise-recommend 30 minutes of weightbearing exercise 3 days a week.  Resistance training ,such as doing bands and light weights, can be particularly helpful.

## 2022-01-13 LAB — POCT INR: INR: 2.9 (ref 2.0–3.0)

## 2022-01-14 NOTE — Telephone Encounter (Signed)
Patient advised.

## 2022-01-20 ENCOUNTER — Encounter: Payer: Self-pay | Admitting: Family Medicine

## 2022-01-22 ENCOUNTER — Telehealth (INDEPENDENT_AMBULATORY_CARE_PROVIDER_SITE_OTHER): Payer: Medicare Other | Admitting: Family Medicine

## 2022-01-22 ENCOUNTER — Encounter: Payer: Self-pay | Admitting: Family Medicine

## 2022-01-22 DIAGNOSIS — Z9889 Other specified postprocedural states: Secondary | ICD-10-CM | POA: Diagnosis not present

## 2022-01-22 DIAGNOSIS — Z952 Presence of prosthetic heart valve: Secondary | ICD-10-CM

## 2022-01-22 DIAGNOSIS — Z7901 Long term (current) use of anticoagulants: Secondary | ICD-10-CM

## 2022-01-22 NOTE — Telephone Encounter (Signed)
See anticoagulation flow sheet.

## 2022-01-26 ENCOUNTER — Other Ambulatory Visit: Payer: Self-pay | Admitting: Family Medicine

## 2022-01-26 ENCOUNTER — Encounter: Payer: Self-pay | Admitting: Family Medicine

## 2022-01-26 MED ORDER — ROSUVASTATIN CALCIUM 10 MG PO TABS: 15.0000 mg | ORAL_TABLET | Freq: Every day | ORAL | 3 refills | Status: DC

## 2022-01-26 NOTE — Telephone Encounter (Signed)
Message sent to Dr. Linford Arnold to check into dosage question.

## 2022-01-26 NOTE — Addendum Note (Signed)
Addended by: Elizabeth Palau on: 01/26/2022 11:43 AM   Modules accepted: Orders

## 2022-01-26 NOTE — Telephone Encounter (Signed)
Attempted call to patient. Left voice mail . Requesting a return call.

## 2022-01-26 NOTE — Telephone Encounter (Signed)
Patient sent a message that rosuvastatin should be 1 1/2 tablets daily.  Is this correct? Thank you ,  Rogene Houston, LPN

## 2022-01-27 LAB — HM DIABETES EYE EXAM

## 2022-01-27 MED ORDER — ROSUVASTATIN CALCIUM 10 MG PO TABS: 10.0000 mg | ORAL_TABLET | Freq: Every day | ORAL | 3 refills | Status: DC

## 2022-01-27 NOTE — Telephone Encounter (Signed)
Spoke w/pt and she would like to stay at the taking 1 tablet for now.

## 2022-01-27 NOTE — Telephone Encounter (Signed)
Okay, please call patient and let her know and see if she is willing to just go up on the dose or if she wants to stay at 20 since her not letting us do 1-1/2 tabs.  Can correct the prescription and get that sent.

## 2022-02-05 ENCOUNTER — Ambulatory Visit: Payer: Self-pay | Admitting: Family Medicine

## 2022-02-05 ENCOUNTER — Telehealth: Payer: Self-pay | Admitting: Family Medicine

## 2022-02-11 ENCOUNTER — Other Ambulatory Visit: Payer: Self-pay | Admitting: Family Medicine

## 2022-02-11 NOTE — Telephone Encounter (Signed)
see note

## 2022-02-12 ENCOUNTER — Encounter: Payer: Self-pay | Admitting: Family Medicine

## 2022-02-27 ENCOUNTER — Telehealth: Payer: Self-pay | Admitting: Family Medicine

## 2022-02-27 NOTE — Telephone Encounter (Signed)
My chart note sent.

## 2022-03-09 ENCOUNTER — Encounter: Payer: Self-pay | Admitting: Family Medicine

## 2022-03-12 ENCOUNTER — Encounter: Payer: Self-pay | Admitting: Family Medicine

## 2022-05-07 ENCOUNTER — Telehealth: Payer: Self-pay | Admitting: Family Medicine

## 2022-05-07 NOTE — Telephone Encounter (Signed)
Called patient to schedule Medicare Annual Wellness Visit (AWV). Left message for patient to call back and schedule Medicare Annual Wellness Visit (AWV).  Last date of AWV: 11/04/2021  Please schedule an appointment at any time with Nurse Health Advisor.  If any questions, please contact me at [phone].  Thank you ,  Lin Givens Patient Access Advocate II Direct Dial: [phone]

## 2022-05-11 ENCOUNTER — Telehealth: Payer: Self-pay | Admitting: Family Medicine

## 2022-05-11 NOTE — Telephone Encounter (Signed)
Called pt and advised her of recommendations. She is going to check this tomorrow.

## 2022-05-11 NOTE — Telephone Encounter (Signed)
Call pt: [name] is  at  3.6.  please check again this week instead of 2 weeks since borderline high.

## 2022-05-17 ENCOUNTER — Other Ambulatory Visit: Payer: Self-pay | Admitting: Family Medicine

## 2022-05-19 NOTE — Telephone Encounter (Signed)
Rx written by historical provider. 

## 2022-06-03 ENCOUNTER — Telehealth: Payer: Self-pay | Admitting: *Deleted

## 2022-06-03 NOTE — Telephone Encounter (Signed)
Called pt and advised her to continue on same dosing.

## 2022-06-24 ENCOUNTER — Encounter: Payer: Self-pay | Admitting: Family Medicine

## 2022-07-01 ENCOUNTER — Telehealth: Payer: Self-pay | Admitting: Family Medicine

## 2022-07-01 NOTE — Telephone Encounter (Signed)
Patient: [name] is 2.0 which is a little too low.  Any recent changes or antibiotics etc.?  If no major changes I believe she is taking a whole tab 3 days a week and a half a tab for.  If that is correct, then have her increase to a whole tab 4 days a week and a half a tab 3 days a week.  Alternating as much as possible.

## 2022-07-02 NOTE — Telephone Encounter (Signed)
Agree with documentation as above.   Zavon Hyson, MD  

## 2022-07-02 NOTE — Telephone Encounter (Addendum)
Task completed. Patient has been notified of provider's note & recommendation. Per patient, she has had no major changes to diet, medications and or antibiotics at this time. Patient has been taking 5 mg Tues/Thurs/Fri and 2.5 mg on Mon/Wed/Sat/Sun. She mentioned she did alternate her dose on Tuesday - she did 5 mg and then 2.5 mg = 7.5 mg.  The patient will take 5 mg on Tues/Thurs/Fri/Sat and 2.5 mg on Mon/Wed/Sunday. She mentioned she will have her [name] checked on 07/07/22.

## 2022-07-08 ENCOUNTER — Other Ambulatory Visit: Payer: Self-pay | Admitting: Family Medicine

## 2022-07-09 ENCOUNTER — Ambulatory Visit (INDEPENDENT_AMBULATORY_CARE_PROVIDER_SITE_OTHER): Payer: Medicare Other | Admitting: Family Medicine

## 2022-07-09 ENCOUNTER — Encounter: Payer: Self-pay | Admitting: Family Medicine

## 2022-07-09 VITALS — BP 145/54 | HR 57 | Ht 62.0 in | Wt 154.0 lb

## 2022-07-09 DIAGNOSIS — I10 Essential (primary) hypertension: Secondary | ICD-10-CM | POA: Diagnosis not present

## 2022-07-09 DIAGNOSIS — E1169 Type 2 diabetes mellitus with other specified complication: Secondary | ICD-10-CM

## 2022-07-09 LAB — POCT GLYCOSYLATED HEMOGLOBIN (HGB A1C): Hemoglobin A1C: 6.8 % — AB (ref 4.0–5.6)

## 2022-07-09 LAB — POCT UA - MICROALBUMIN
Creatinine, POC: 100 mg/dL
Microalbumin Ur, POC: 30 mg/L

## 2022-07-09 MED ORDER — METFORMIN HCL ER 500 MG PO TB24
500.0000 mg | ORAL_TABLET | Freq: Every day | ORAL | 0 refills | Status: DC
Start: 2022-07-09 — End: 2022-09-29

## 2022-07-09 NOTE — Assessment & Plan Note (Signed)
Pressure was elevated today but it looked great when she was here last time so can have her come back in about 2 weeks for nurse visit.  If still elevated then we will need to make an adjustment to her regimen.

## 2022-07-09 NOTE — Assessment & Plan Note (Signed)
A1c went up slightly to 6.8, from 6.5.  She is gena continue to work on some dietary changes she really does not feel like the Januvia has been helpful and is willing to retry metformin.  So we will start with the 500 mg extended release daily if tolerating well we can always try to go up to twice a day.  She is a little hesitant to start a GLP-1 at this point in time.  Lab Results  Component Value Date   HGBA1C 6.8 (A) 07/09/2022

## 2022-07-09 NOTE — Patient Instructions (Signed)
2 week f/u with nurse for bp check

## 2022-07-09 NOTE — Progress Notes (Signed)
Established Patient Office Visit  Subjective   Patient ID: Erin Hill, adult    DOB: September 27, 1955  Age: 67 y.o. MRN: [id number]  Chief Complaint  Patient presents with   Hypertension   Diabetes    HPI  Hypertension- Pt denies chest pain, SOB, dizziness, or heart palpitations.  Taking meds as directed w/o problems.  Denies medication side effects.    Diabetes - no hypoglycemic events. No wounds or sores that are not healing well. No increased thirst or urination. Checking glucose at home. Taking medications as prescribed without any side effects.  She is concerned that her blood sugars have been a little bit more elevated over the last 2 to 3 weeks.  They have been running more in the 150s to 200s.  She says if anything she is actually been more physically active she has been going to the gym 4 to 5 days/week.  She does pretty good with her diet overall she did add in a third small meal as prior to this she had only been eating twice a day.  Continuing to do home [name]'s every 2 weeks because of her valve replacement.  [name] that we received yesterday was 2.9 so at goal.  No recent problems or changes.  No recent antibiotic use.    ROS    Objective:     BP (!) 145/54   Pulse (!) 57   Ht 5\' 2"  (1.575 m)   Wt 154 lb (69.9 kg)   SpO2 98%   BMI 28.17 kg/m    Physical Exam Constitutional:      Appearance: She is well-developed.  HENT:     Head: Normocephalic and atraumatic.  Cardiovascular:     Rate and Rhythm: Normal rate and regular rhythm.     Heart sounds: Normal heart sounds.     Comments: Systolic click and diastolic murmur. Pulmonary:     Effort: Pulmonary effort is normal.     Breath sounds: Normal breath sounds.  Skin:    General: Skin is warm and dry.  Neurological:     Mental Status: She is alert and oriented to person, place, and time.  Psychiatric:        Behavior: Behavior normal.      Results for orders placed or performed in visit on 07/09/22  POCT  glycosylated hemoglobin (Hb A1C)  Result Value Ref Range   Hemoglobin A1C 6.8 (A) 4.0 - 5.6 %   HbA1c POC (<> result, manual entry)     HbA1c, POC (prediabetic range)     HbA1c, POC (controlled diabetic range)    POCT UA - Microalbumin  Result Value Ref Range   Microalbumin Ur, POC 30 mg/L   Creatinine, POC 100 mg/dL   Albumin/Creatinine Ratio, Urine, POC 30-300       The 10-year ASCVD risk score (Arnett DK, et al., 2019) is: 16.1%    Assessment & Plan:   Problem List Items Addressed This Visit       Cardiovascular and Mediastinum   Essential hypertension - Primary    Pressure was elevated today but it looked great when she was here last time so can have her come back in about 2 weeks for nurse visit.  If still elevated then we will need to make an adjustment to her regimen.        Endocrine   Type 2 diabetes mellitus with other specified complication (HCC)    A1c went up slightly to 6.8, from 6.5.  She  is gena continue to work on some dietary changes she really does not feel like the Januvia has been helpful and is willing to retry metformin.  So we will start with the 500 mg extended release daily if tolerating well we can always try to go up to twice a day.  She is a little hesitant to start a GLP-1 at this point in time.  Lab Results  Component Value Date   HGBA1C 6.8 (A) 07/09/2022        Relevant Medications   metFORMIN (GLUCOPHAGE-XR) 500 MG 24 hr tablet   Other Relevant Orders   POCT glycosylated hemoglobin (Hb A1C) (Completed)   POCT UA - Microalbumin (Completed)    Return in about 3 months (around 10/09/2022) for DM/HTN.    Nani Gasser, MD

## 2022-07-27 ENCOUNTER — Ambulatory Visit (INDEPENDENT_AMBULATORY_CARE_PROVIDER_SITE_OTHER): Payer: Medicare Other | Admitting: Sports Medicine

## 2022-07-27 VITALS — BP 128/60 | HR 73

## 2022-07-27 DIAGNOSIS — I10 Essential (primary) hypertension: Secondary | ICD-10-CM

## 2022-07-27 NOTE — Progress Notes (Signed)
   Established Patient Office Visit  Subjective   Patient ID: Erin Hill, adult    DOB: 11/19/1955  Age: 67 y.o. MRN: [id number]  Chief Complaint  Patient presents with   Hypertension    HPI  Erin Hill is here for blood pressure check. Denies chest pain, shortness of breath or dizziness.   ROS    Objective:     BP 128/60   Pulse 73   SpO2 100%    Physical Exam   No results found for any visits on 07/27/22.    The 10-year ASCVD risk score (Arnett DK, et al., 2019) is: 12.7%    Assessment & Plan:  Hypertension - Blood pressure within normal limits. Patient advised to continue current medications as directed. Follow up in 3 months for HTN with Dr Linford Arnold.    Problem List Items Addressed This Visit       Unprioritized   Essential hypertension - Primary    Return in about 3 months (around 10/27/2022) for HTN with Dr Linford Arnold.Earna Coder, Janalyn Harder, CMA

## 2022-08-04 ENCOUNTER — Telehealth: Payer: Self-pay | Admitting: *Deleted

## 2022-08-04 NOTE — Telephone Encounter (Signed)
Called pt and advised her to continue with same dosing and recheck in 2 weeks.

## 2022-08-05 ENCOUNTER — Encounter: Payer: Self-pay | Admitting: Family Medicine

## 2022-08-21 LAB — HM DIABETES EYE EXAM

## 2022-09-09 ENCOUNTER — Encounter: Payer: Self-pay | Admitting: Physician Assistant

## 2022-09-09 ENCOUNTER — Ambulatory Visit (INDEPENDENT_AMBULATORY_CARE_PROVIDER_SITE_OTHER): Payer: Medicare Other | Admitting: Physician Assistant

## 2022-09-09 VITALS — BP 135/53 | HR 72 | Ht 62.0 in | Wt 151.2 lb

## 2022-09-09 DIAGNOSIS — Z91018 Allergy to other foods: Secondary | ICD-10-CM | POA: Diagnosis not present

## 2022-09-09 DIAGNOSIS — R21 Rash and other nonspecific skin eruption: Secondary | ICD-10-CM | POA: Insufficient documentation

## 2022-09-09 DIAGNOSIS — T781XXS Other adverse food reactions, not elsewhere classified, sequela: Secondary | ICD-10-CM | POA: Diagnosis not present

## 2022-09-09 DIAGNOSIS — T781XXA Other adverse food reactions, not elsewhere classified, initial encounter: Secondary | ICD-10-CM | POA: Insufficient documentation

## 2022-09-09 MED ORDER — EPINEPHRINE 0.3 MG/0.3ML IJ SOAJ: 0.3000 mg | Freq: Once | INTRAMUSCULAR | 0 refills | Status: AC

## 2022-09-09 MED ORDER — FEXOFENADINE HCL 180 MG PO TABS
180.0000 mg | ORAL_TABLET | Freq: Every day | ORAL | 3 refills | Status: DC
Start: 2022-09-09 — End: 2023-09-29

## 2022-09-09 NOTE — Patient Instructions (Addendum)
Restart allegra Follow up with allergy Epi-pen if needed Try keeping a food diary

## 2022-09-09 NOTE — Progress Notes (Signed)
Established Patient Office Visit  Subjective   Patient ID: Erin Hill, adult    DOB: 1955/08/02  Age: 67 y.o. MRN: [id number]  Chief Complaint  Patient presents with   food allergies    Pt states she is still having problems with food allergies.    HPI Patient presents to the clinic to discuss more frequent allergic reaction signs and symptoms to foods recently.  Patient has a history of multiple food and environmental allergies.  She is not taking any daily antihistamine.  She has seen allergy and been tested about 5 years ago.  She is not sure where she went.  She tested positive for grains, shellfish, strawberries.  She is now noticing after eating many different meals having tiny itchy bumps around mouth and neck.  Benadryl does help with symptoms but leaves her feeling very sedated.  She denies any tongue or lip swelling or shortness of breath recently. She did have BLT last night with asagio bread and noticed skin rash and itchiness. None of those things does she have a known allergy too. She does not have epi-pen due to cost.   . 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 08/27/2011   Atrial flutter with rapid ventricular response (HCC) 02/23/2013   Long term (current) use of anticoagulants 08/27/2011   Normal coronary arteries 08/27/2011   Rheumatic heart disease 08/27/2011   Type 2 diabetes mellitus with other specified complication (HCC) 07/21/2017   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    Objective:     BP (!) 135/53 (BP Location: Left Arm, Patient Position: Sitting, Cuff Size: Normal)   Pulse 72   Ht 5\' 2"  (1.575 m)   Wt 151 lb 3.2 oz (68.6 kg)   SpO2 99%   BMI 27.65 kg/m  BP Readings from Last 3 Encounters:  09/09/22 (!) 135/53  07/27/22 128/60  07/09/22 (!) 145/54   Wt Readings from Last 3 Encounters:  09/09/22 151 lb 3.2 oz (68.6 kg)  07/09/22 154 lb (69.9 kg)  11/04/21 156 lb (70.8 kg)      Physical Exam Constitutional:      Appearance: Normal appearance. She is obese.  HENT:     Head: Normocephalic.  Cardiovascular:     Rate and Rhythm: Normal rate.  Pulmonary:     Effort: Pulmonary effort is normal.  Musculoskeletal:        General: Normal range of motion.     Cervical back: Normal range of motion and neck supple. No tenderness.  Lymphadenopathy:  Cervical: No cervical adenopathy.  Skin:    Comments: Whelps and tiny flesh colored papules of neck and surrounding mouth. No lip or tongue swelling.   Neurological:     General: No focal deficit present.     Mental Status: She is alert and oriented to person, place, and time.  Psychiatric:        Mood and Affect: Mood normal.      The 10-year ASCVD risk score (Arnett DK, et al., 2019) is: 15.7%    Assessment & Plan:  .Marland KitchenRodneisha was seen today for food allergies.  Diagnoses and all orders for this visit:  Allergic reaction to food, sequela -     fexofenadine (ALLEGRA) 180 MG tablet; Take 1 tablet (180 mg total) by mouth daily. -     EPINEPHrine 0.3 mg/0.3 mL IJ  SOAJ injection; Inject 0.3 mg into the muscle once for 1 dose. -     Ambulatory referral to Allergy  Multiple food allergies -     fexofenadine (ALLEGRA) 180 MG tablet; Take 1 tablet (180 mg total) by mouth daily. -     Ambulatory referral to Allergy  Skin rash -     fexofenadine (ALLEGRA) 180 MG tablet; Take 1 tablet (180 mg total) by mouth daily. -     Ambulatory referral to Allergy   Patient has seen allergy in the past but she does not have an up-to-date referral and not sure where she went.  Will make new referral to allergy.  Discussed potential need for EpiPen if allergic reactions worsen.  Sent to the pharmacy.  She should of course use Benadryl 50 mg for mild reactions.  I will go ahead and start antihistamine Allegra daily to see if can keep some of her mild histamine responses controlled.  Continue to keep a food diary to see if she can figure out any potential triggers. Declined solumedrol in office today.      Tandy Gaw, PA-C

## 2022-09-15 LAB — POCT INR: INR: 3.2 — AB (ref 0.80–1.20)

## 2022-09-16 ENCOUNTER — Encounter: Payer: Self-pay | Admitting: Family Medicine

## 2022-09-18 ENCOUNTER — Encounter: Payer: Self-pay | Admitting: Family Medicine

## 2022-09-26 ENCOUNTER — Other Ambulatory Visit: Payer: Self-pay | Admitting: Family Medicine

## 2022-09-26 DIAGNOSIS — E1169 Type 2 diabetes mellitus with other specified complication: Secondary | ICD-10-CM

## 2022-09-29 ENCOUNTER — Other Ambulatory Visit: Payer: Self-pay | Admitting: Family Medicine

## 2022-09-29 DIAGNOSIS — I10 Essential (primary) hypertension: Secondary | ICD-10-CM

## 2022-09-30 ENCOUNTER — Encounter: Payer: Self-pay | Admitting: Family Medicine

## 2022-09-30 MED ORDER — AMOXICILLIN 500 MG PO CAPS: 2000.0000 mg | ORAL_CAPSULE | Freq: Once | ORAL | 0 refills | Status: AC

## 2022-10-12 ENCOUNTER — Ambulatory Visit: Payer: Medicare Other | Admitting: Family Medicine

## 2022-10-13 LAB — POCT INR: POC INR: 3

## 2022-10-21 ENCOUNTER — Other Ambulatory Visit: Payer: Self-pay | Admitting: Family Medicine

## 2022-10-27 ENCOUNTER — Other Ambulatory Visit: Payer: Self-pay | Admitting: Family Medicine

## 2022-10-27 DIAGNOSIS — E119 Type 2 diabetes mellitus without complications: Secondary | ICD-10-CM

## 2022-10-27 DIAGNOSIS — E1169 Type 2 diabetes mellitus with other specified complication: Secondary | ICD-10-CM

## 2022-10-29 ENCOUNTER — Telehealth: Payer: Self-pay | Admitting: Family Medicine

## 2022-10-29 ENCOUNTER — Encounter: Payer: Self-pay | Admitting: Family Medicine

## 2022-10-29 ENCOUNTER — Ambulatory Visit (INDEPENDENT_AMBULATORY_CARE_PROVIDER_SITE_OTHER): Payer: Medicare Other | Admitting: Family Medicine

## 2022-10-29 VITALS — BP 125/46 | HR 74 | Ht 62.0 in | Wt 150.0 lb

## 2022-10-29 DIAGNOSIS — Z952 Presence of prosthetic heart valve: Secondary | ICD-10-CM

## 2022-10-29 DIAGNOSIS — Z23 Encounter for immunization: Secondary | ICD-10-CM

## 2022-10-29 DIAGNOSIS — E1169 Type 2 diabetes mellitus with other specified complication: Secondary | ICD-10-CM

## 2022-10-29 DIAGNOSIS — Z7984 Long term (current) use of oral hypoglycemic drugs: Secondary | ICD-10-CM

## 2022-10-29 DIAGNOSIS — Z7901 Long term (current) use of anticoagulants: Secondary | ICD-10-CM

## 2022-10-29 DIAGNOSIS — Z5181 Encounter for therapeutic drug level monitoring: Secondary | ICD-10-CM

## 2022-10-29 DIAGNOSIS — I10 Essential (primary) hypertension: Secondary | ICD-10-CM | POA: Diagnosis not present

## 2022-10-29 DIAGNOSIS — Z9889 Other specified postprocedural states: Secondary | ICD-10-CM

## 2022-10-29 LAB — POCT GLYCOSYLATED HEMOGLOBIN (HGB A1C): Hemoglobin A1C: 9.9 % — AB (ref 4.0–5.6)

## 2022-10-29 LAB — POCT UA - MICROALBUMIN
Albumin/Creatinine Ratio, Urine, POC: <30
Creatinine, POC: 100 mg/dL
Microalbumin Ur, POC: 30 mg/L

## 2022-10-29 MED ORDER — METFORMIN HCL ER 500 MG PO TB24
1000.0000 mg | ORAL_TABLET | Freq: Every day | ORAL | 1 refills | Status: DC
Start: 2022-10-29 — End: 2022-12-28

## 2022-10-29 NOTE — Telephone Encounter (Signed)
Spoke with patient. She is coming in for  this lab work to be drawn  tomorrow morning=kph

## 2022-10-29 NOTE — Telephone Encounter (Signed)
Please call patient and let her know that I meant to order some labs on her this morning and give her the lab slip.  If there is any way she can come back sometime this month and have that drawn that would be wonderful.

## 2022-10-29 NOTE — Assessment & Plan Note (Signed)
Continue to monitor [name].

## 2022-10-29 NOTE — Progress Notes (Signed)
Established Patient Office Visit  Subjective   Patient ID: Erin Hill, adult    DOB: 1955-10-10  Age: 67 y.o. MRN: [id number]  Chief Complaint  Patient presents with   Diabetes    HPI  Hypertension- Pt denies chest pain, SOB, dizziness, or heart palpitations.  Taking meds as directed w/o problems.  Denies medication side effects.    Diabetes - no hypoglycemic events. No wounds or sores that are not healing well. No increased thirst or urination. Checking glucose at home. Taking medications as prescribed without any side effects.  Artificial heart valve-she does her Coumadin every 2 weeks.'s recently her levels have been in the therapeutic range.  In fact we just got the fax for her [name] today and it was 2.6.  Exercising 4 times per week which is fantastic.    ROS    Objective:     BP (!) 125/46   Pulse 74   Ht 5\' 2"  (1.575 m)   Wt 150 lb (68 kg)   SpO2 99%   BMI 27.44 kg/m    Physical Exam Vitals and nursing note reviewed.  Constitutional:      Appearance: Normal appearance.  HENT:     Head: Normocephalic and atraumatic.  Eyes:     Conjunctiva/sclera: Conjunctivae normal.  Cardiovascular:     Rate and Rhythm: Normal rate and regular rhythm.  Pulmonary:     Effort: Pulmonary effort is normal.     Breath sounds: Normal breath sounds.  Skin:    General: Skin is warm and dry.  Neurological:     Mental Status: She is alert.  Psychiatric:        Mood and Affect: Mood normal.     Results for orders placed or performed in visit on 10/29/22  POCT HgB A1C  Result Value Ref Range   Hemoglobin A1C 9.9 (A) 4.0 - 5.6 %   HbA1c POC (<> result, manual entry)     HbA1c, POC (prediabetic range)     HbA1c, POC (controlled diabetic range)    POCT UA - Microalbumin  Result Value Ref Range   Microalbumin Ur, POC 30 mg/L   Creatinine, POC 100 mg/dL   Albumin/Creatinine Ratio, Urine, POC <30       The 10-year ASCVD risk score (Arnett DK, et al., 2019) is:  13.6%    Assessment & Plan:   Problem List Items Addressed This Visit       Cardiovascular and Mediastinum   Essential hypertension    Well controlled. Continue current regimen. Follow up in  4 months.       Relevant Orders   CMP14+EGFR   Lipid panel   CBC     Endocrine   Type 2 diabetes mellitus with other specified complication (HCC) - Primary    Said she felt like she knew her A1c was going to be elevated this time.  She thinks we probably need to go ahead and go up on the metformin.  Will increase to 1000 mg extended release daily.  This may not be enough to get is quite where we need to go but really encouraged her to work on her diet and exercise routine.  She does exercise pretty regularly.  We also discussed that if we need to add a second medication something like Marcelline Deist could be a great choice with the additional benefits that it provides.  She is not interested in a GLP-1.  And really did not get a lot of benefit with  Januvia. she is due for updated renal function.      Relevant Medications   metFORMIN (GLUCOPHAGE-XR) 500 MG 24 hr tablet   Other Relevant Orders   POCT HgB A1C (Completed)   POCT UA - Microalbumin (Completed)   CMP14+EGFR   Lipid panel   CBC     Other   MITRAL VALVE REPLACEMENT, HX OF    Continue to monitor [name].      Encounter for monitoring Coumadin therapy   Aortic valve replaced    Continue to monitor [name].      Other Visit Diagnoses     Encounter for immunization       Relevant Orders   Flu Vaccine Trivalent High Dose (Fluad) (Completed)       Return in about 6 weeks (around 12/11/2022) for Diabetes follow-up.    Nani Gasser, MD

## 2022-10-29 NOTE — Assessment & Plan Note (Signed)
Well controlled. Continue current regimen. Follow up in  4 months.   

## 2022-10-29 NOTE — Assessment & Plan Note (Signed)
Said she felt like she knew her A1c was going to be elevated this time.  She thinks we probably need to go ahead and go up on the metformin.  Will increase to 1000 mg extended release daily.  This may not be enough to get is quite where we need to go but really encouraged her to work on her diet and exercise routine.  She does exercise pretty regularly.  We also discussed that if we need to add a second medication something like Marcelline Deist could be a great choice with the additional benefits that it provides.  She is not interested in a GLP-1.  And really did not get a lot of benefit with Januvia. she is due for updated renal function.

## 2022-11-05 LAB — CBC
Hematocrit: 41.1 % (ref 34.0–46.6)
Hemoglobin: 13.5 g/dL (ref 11.1–15.9)
MCH: 30.3 pg (ref 26.6–33.0)
MCHC: 32.8 g/dL (ref 31.5–35.7)
MCV: 92 fL (ref 79–97)
Platelets: 269 10*3/uL (ref 150–450)
RBC: 4.45 x10E6/uL (ref 3.77–5.28)
RDW: 13.5 % (ref 11.7–15.4)
WBC: 6.1 10*3/uL (ref 3.4–10.8)

## 2022-11-05 LAB — CMP14+EGFR
ALT: 32 IU/L (ref 0–32)
AST: 29 IU/L (ref 0–40)
Albumin: 3.9 g/dL (ref 3.9–4.9)
Alkaline Phosphatase: 41 IU/L — ABNORMAL LOW (ref 44–121)
BUN/Creatinine Ratio: 33 — ABNORMAL HIGH (ref 12–28)
BUN: 33 mg/dL — ABNORMAL HIGH (ref 8–27)
Bilirubin Total: 0.4 mg/dL (ref 0.0–1.2)
CO2: 21 mmol/L (ref 20–29)
Calcium: 9.4 mg/dL (ref 8.7–10.3)
Chloride: 103 mmol/L (ref 96–106)
Creatinine, Ser: 1.01 mg/dL — ABNORMAL HIGH (ref 0.57–1.00)
Globulin, Total: 2.4 g/dL (ref 1.5–4.5)
Glucose: 192 mg/dL — ABNORMAL HIGH (ref 70–99)
Potassium: 4.5 mmol/L (ref 3.5–5.2)
Sodium: 139 mmol/L (ref 134–144)
Total Protein: 6.3 g/dL (ref 6.0–8.5)
eGFR: 61 mL/min/{1.73_m2} (ref >59)

## 2022-11-05 LAB — LIPID PANEL
Chol/HDL Ratio: 4.2 ratio (ref 0.0–4.4)
Cholesterol, Total: 171 mg/dL (ref 100–199)
HDL: 41 mg/dL (ref >39)
LDL Chol Calc (NIH): 101 mg/dL — ABNORMAL HIGH (ref 0–99)
Triglycerides: 164 mg/dL — ABNORMAL HIGH (ref 0–149)
VLDL Cholesterol Cal: 29 mg/dL (ref 5–40)

## 2022-11-05 NOTE — Progress Notes (Signed)
Hi Erin Hill, kidney function is stable at 1.0 you do look a little dehydrated on your blood work so please just make sure you are getting enough water and daily.  Liver function is normal.  The alkaline phosphatase is a little low but that is not concerning it is only concerning if it is high.  LDL cholesterol off just by 1 point.  Just continue to work on healthy diet and regular exercise.  And triglycerides were up a little bit as well.  Blood count looks great.

## 2022-11-06 ENCOUNTER — Encounter: Payer: Self-pay | Admitting: Family Medicine

## 2022-11-06 ENCOUNTER — Ambulatory Visit (INDEPENDENT_AMBULATORY_CARE_PROVIDER_SITE_OTHER): Payer: Medicare Other | Admitting: Family Medicine

## 2022-11-06 VITALS — BP 130/44 | HR 62 | Ht 62.0 in | Wt 150.0 lb

## 2022-11-06 DIAGNOSIS — E1169 Type 2 diabetes mellitus with other specified complication: Secondary | ICD-10-CM

## 2022-11-06 DIAGNOSIS — Z7984 Long term (current) use of oral hypoglycemic drugs: Secondary | ICD-10-CM | POA: Diagnosis not present

## 2022-11-06 DIAGNOSIS — I10 Essential (primary) hypertension: Secondary | ICD-10-CM

## 2022-11-06 DIAGNOSIS — Z0000 Encounter for general adult medical examination without abnormal findings: Secondary | ICD-10-CM | POA: Diagnosis not present

## 2022-11-06 MED ORDER — DAPAGLIFLOZIN PROPANEDIOL 5 MG PO TABS
5.0000 mg | ORAL_TABLET | Freq: Every day | ORAL | 2 refills | Status: DC
Start: 2022-11-06 — End: 2022-11-09

## 2022-11-06 MED ORDER — AMLODIPINE-OLMESARTAN 5-40 MG PO TABS
1.0000 | ORAL_TABLET | Freq: Every day | ORAL | 1 refills | Status: DC
Start: 2022-11-06 — End: 2023-05-27

## 2022-11-06 NOTE — Patient Instructions (Addendum)
Erin Hill is covered . It looks like Erin plan it comes in 2 different strengths.  So for now start with a lower one.  Will switch her Tribenzor to just Erin losartan/amlodipine without Erin diuretic.

## 2022-11-06 NOTE — Progress Notes (Signed)
Subjective:   Erin Hill is a 67 y.o. female who presents for an Initial Medicare Annual Wellness Visit.  Visit Complete: In person  Patient Medicare AWV questionnaire was completed by the patient on 11/06/22; I have confirmed that all information answered by patient is correct and no changes since this date.       Objective:    Today's Vitals   11/06/22 1414  BP: (!) 130/44  Pulse: 62  SpO2: 99%  Weight: 150 lb (68 kg)  Height: 5\' 2"  (1.575 m)   Body mass index is 27.44 kg/m.     08/27/2021    2:53 PM 02/19/2017    1:30 PM 06/05/2013    3:15 PM  Advanced Directives  Does Patient Have a Medical Advance Directive? Yes Yes Patient has advance directive, copy not in chart  Type of Advance Directive Healthcare Power of [place];Living will Healthcare Power of [place];Living will Healthcare Power of [place];Living will  Copy of Healthcare Power of Attorney in Chart? No - copy requested No - copy requested Copy requested from family    Current Medications (verified) Outpatient Encounter Medications as of 11/06/2022  Medication Sig   ACCU-CHEK GUIDE test strip CHECK FASTING BLOOD SUGAR EVERY MORNING. DX: E11.9   AMBULATORY NON FORMULARY MEDICATION Take 1 capsule by mouth daily. Medication Name: HERBAL FORMULARY TUMERIC,GARLIC,GINGER,ROSEMARY,CINNAMON   amLODipine-olmesartan (AZOR) 5-40 MG tablet Take 1 tablet by mouth daily.   b complex vitamins tablet Take 1 tablet by mouth daily.   Blood Glucose Monitoring Suppl (ACCU-CHEK AVIVA PLUS) w/Device KIT Use to check blood sugar twice daily. Dx: R73.01   Coenzyme Q-10 100 MG capsule Take 300 mg by mouth daily.   dapagliflozin propanediol (FARXIGA) 5 MG TABS tablet Take 1 tablet (5 mg total) by mouth daily before breakfast.   fexofenadine (ALLEGRA) 180 MG tablet Take 1 tablet (180 mg total) by mouth daily.   Lancets (ACCU-CHEK MULTICLIX) lancets Use to check blood sugar twice daily. Dx: R73.01   metFORMIN (GLUCOPHAGE-XR) 500 MG 24  hr tablet Take 2 tablets (1,000 mg total) by mouth daily with breakfast.   metoprolol succinate (TOPROL-XL) 100 MG 24 hr tablet TAKE 1 TABLET BY MOUTH DAILY WITH OR IMMEDIATELY FOLLOWING A MEAL.   OVER THE COUNTER MEDICATION Super C ( vit D, Vit D, zinc)   rosuvastatin (CRESTOR) 10 MG tablet Take 1 tablet (10 mg total) by mouth daily.   Vitamin K, Phytonadione, 100 MCG TABS Take 100 mcg daily by mouth.   warfarin (COUMADIN) 5 MG tablet TAKE 1 TABLET BY MOUTH EVERY MONDAY,WEDNESDAY, AND FRIDAY. TAKE 1/2 TABLET ALL OTHER DAYS AS DIRECTED BY COUMADIN CLINIC.   [DISCONTINUED] Olmesartan-amLODIPine-HCTZ 40-5-25 MG TABS TAKE ONE TABLET BY MOUTH DAILY   No facility-administered encounter medications on file as of 11/06/2022.    Allergies (verified) Erythromycin, Gemfibrozil, Shellfish allergy, Shellfish-derived products, Strawberry extract, Versed [midazolam], Iodinated contrast media, Iodine, Lipitor [atorvastatin], Metformin and related, Protonix [pantoprazole sodium], and Strawberry (diagnostic)   History: Past Medical History:  Diagnosis Date   Atrial flutter (HCC)    2015   Hearing loss    chronic   Miscarriage    X 2   Proteinuria    chronic (not related to DM)   Rheumatic heart disease    s/p aortic/ mitral valve replace   Past Surgical History:  Procedure Laterality Date   double heart valve replacement  1999   echo EF 75-80%     SAB     X 2  stapes surgery, left  2003   then removed 2 years later   TAB      X 1   TONSILLECTOMY  as a child   Family History  Problem Relation Age of Onset   Diabetes Father    Lung cancer Mother        smoked until her 12's   Hypertension Mother    Diabetes Father    Hyperlipidemia Father    Hypertension Father    Social History   Socioeconomic History   Marital status: Widowed    Spouse name: Theron Arista   Number of children: Not on file   Years of education: Not on file   Highest education level: Associate degree: occupational,  Scientist, product/process development, or vocational program  Occupational History   Occupation: Printmaker: [employer]  Tobacco Use   Smoking status: Never   Smokeless tobacco: Never  Substance and Sexual Activity   Alcohol use: Not on file   Drug use: Not on file   Sexual activity: Yes    Partners: Male  Other Topics Concern   Not on file  Social History Narrative   No regular exercise.     Social Determinants of Health   Financial Resource Strain: Low Risk  (11/06/2022)   Overall Financial Resource Strain (CARDIA)    Difficulty of Paying Living Expenses: Not hard at all  Food Insecurity: No Food Insecurity (11/06/2022)   Hunger Vital Sign    Worried About Running Out of Food in the Last Year: Never true    Ran Out of Food in the Last Year: Never true  Transportation Needs: No Transportation Needs (11/06/2022)   PRAPARE - Administrator, Civil Service (Medical): No    Lack of Transportation (Non-Medical): No  Physical Activity: Sufficiently Active (11/06/2022)   Exercise Vital Sign    Days of Exercise per Week: 4 days    Minutes of Exercise per Session: 60 min  Stress: No Stress Concern Present (11/06/2022)   [employer] of Occupational Health - Occupational Stress Questionnaire    Feeling of Stress : Not at all  Social Connections: Moderately Integrated (11/06/2022)   Social Connection and Isolation Panel [NHANES]    Frequency of Communication with Friends and Family: More than three times a week    Frequency of Social Gatherings with Friends and Family: More than three times a week    Attends Religious Services: More than 4 times per year    Active Member of [employer] or Organizations: Yes    Attends Banker Meetings: More than 4 times per year    Marital Status: Widowed    Tobacco Counseling Counseling given: Not Answered   Clinical Intake:  Pre-visit preparation completed: Yes  Pain : No/denies pain     BMI - recorded: 27 Nutritional  Status: BMI 25 -29 Overweight Diabetes: Yes CBG done?: No Did pt. bring in CBG monitor from home?: No     Interpreter Needed?: No      Activities of Daily Living    11/06/2022    4:54 PM  In your present state of health, do you have any difficulty performing the following activities:  Hearing? 1  Comment hearing loss in left ear  Vision? 0  Difficulty concentrating or making decisions? 0  Walking or climbing stairs? 0  Dressing or bathing? 0  Doing errands, shopping? 0  Preparing Food and eating ? N  Using the Toilet? N  In the past six months,  have you accidently leaked urine? N  Do you have problems with loss of bowel control? N  Managing your Medications? N  Managing your Finances? N  Housekeeping or managing your Housekeeping? N    Patient Care Team: Agapito Games, MD as PCP - General Bill Salinas (Cardiology) Daneen Schick, OD as Referring Physician (Optometry)  Indicate any recent Medical Services you may have received from other than [facility] providers in the past year (date may be approximate).     Assessment:   This is a routine wellness examination for Cindel.  Hearing/Vision screen No results found.   Goals Addressed   None   Depression Screen    09/09/2022   11:39 AM 11/04/2021    2:27 PM 12/20/2020    1:39 PM 04/02/2020    8:11 AM 12/13/2018    8:47 AM 10/07/2018    3:42 PM 09/13/2018    8:43 AM  PHQ 2/9 Scores  PHQ - 2 Score 0 0 0 0 0 0 0  PHQ- 9 Score      1     Fall Risk    09/09/2022   11:39 AM 07/09/2022    8:17 AM 11/04/2021    2:27 PM 12/20/2020    1:39 PM 09/20/2020    1:49 PM  Fall Risk   Falls in the past year? 0 0 0 0 0  Number falls in past yr: 0 0 0 0   Injury with Fall? 0 0 0 0   Risk for fall due to : No Fall Risks No Fall Risks No Fall Risks No Fall Risks   Follow up Falls evaluation completed Falls evaluation completed Falls evaluation completed Falls prevention discussed;Falls evaluation completed Falls evaluation  completed    MEDICARE RISK AT HOME:    TIMED UP AND GO:  Was the test performed? No    Cognitive Function:        11/04/2021    2:27 PM  6CIT Screen  What Year? 0 points  What month? 0 points  What time? 0 points  Count back from 20 0 points  Months in reverse 0 points  Repeat phrase 0 points  Total Score 0 points    Immunizations Immunization History  Administered Date(s) Administered   Fluad Quad(high Dose 65+) 11/04/2021   Fluad Trivalent(High Dose 65+) 10/29/2022   Hepatitis B 06/08/2005, 07/06/2005, 12/21/2005   Influenza Split 11/17/2011   Influenza Whole 12/08/2005, 12/18/2007, 12/10/2008, 11/16/2009   Influenza,inj,Quad PF,6+ Mos 11/08/2018, 11/03/2019, 12/20/2020   PFIZER Comirnaty(Gray Top)Covid-19 Tri-Sucrose Vaccine 08/10/2020   PFIZER(Purple Top)SARS-COV-2 Vaccination 05/09/2019, 05/30/2019, 01/02/2020   Pfizer Covid-19 Vaccine Bivalent Booster 59yrs & up 02/07/2021   Pfizer(Comirnaty)Fall Seasonal Vaccine 12 years and older 01/30/2022   Pneumococcal Polysaccharide-23 12/28/2005   Td 03/19/2001, 02/16/2005   Tdap 07/12/2015   Varicella 02/25/2005, 12/13/2009    TDAP status: Up to date  Flu Vaccine status: Up to date     Screening Tests Health Maintenance  Topic Date Due   Pneumonia Vaccine 73+ Years old (2 of 2 - PCV) 08/15/2021   COVID-19 Vaccine (7 - 2023-24 season) 10/18/2022   Zoster Vaccines- Shingrix (1 of 2) 02/16/2023 (Originally 08/16/2006)   MAMMOGRAM  01/07/2023   OPHTHALMOLOGY EXAM  01/28/2023   HEMOGLOBIN A1C  04/28/2023   FOOT EXAM  07/09/2023   Diabetic kidney evaluation - Urine ACR  10/29/2023   Diabetic kidney evaluation - eGFR measurement  11/04/2023   Medicare Annual Wellness (AWV)  11/06/2023   DTaP/Tdap/Td (4 -  Td or Tdap) 07/11/2025   Colonoscopy  10/24/2028   INFLUENZA VACCINE  Completed   DEXA SCAN  Completed   Hepatitis C Screening  Completed   HPV VACCINES  Aged Out    Health Maintenance  Health  Maintenance Due  Topic Date Due   Pneumonia Vaccine 36+ Years old (2 of 2 - PCV) 08/15/2021   COVID-19 Vaccine (7 - 2023-24 season) 10/18/2022     Lung Cancer Screening: (Low Dose CT Chest recommended if Age 11-80 years, 20 pack-year currently smoking OR have quit w/in 15years.) does not qualify.   Lung Cancer Screening Referral: not needed.   Additional Screening:   Vision Screening: Recommended annual ophthalmology exams for early detection of glaucoma and other disorders of the eye. Is the patient up to date with their annual eye exam?  Yes   Dental Screening: Recommended annual dental exams for proper oral hygiene   Community Resource Referral / Chronic Care Management: CRR required this visit?  No   CCM required this visit?  No     Plan:     I have personally reviewed and noted the following in the patient's chart:   Medical and social history Use of alcohol, tobacco or illicit drugs  Current medications and supplements including opioid prescriptions. Patient is not currently taking opioid prescriptions. Functional ability and status Nutritional status Physical activity Advanced directives List of other physicians Hospitalizations, surgeries, and ER visits in previous 12 months Vitals Screenings to include cognitive, depression, and falls Referrals and appointments  In addition, I have reviewed and discussed with patient certain preventive protocols, quality metrics, and best practice recommendations. A written personalized care plan for preventive services as well as general preventive health recommendations were provided to patient.   No orders of the defined types were placed in this encounter.     Nani Gasser, MD   11/06/2022   After Visit Summary: (In Person-Printed) AVS printed and given to the patient  Nurse Notes: none

## 2022-11-09 MED ORDER — DAPAGLIFLOZIN PROPANEDIOL 5 MG PO TABS
5.0000 mg | ORAL_TABLET | Freq: Every day | ORAL | 1 refills | Status: DC
Start: 2022-11-09 — End: 2023-03-05

## 2022-11-10 LAB — POCT INR: INR: 2.2 (ref 2.0–3.0)

## 2022-11-13 ENCOUNTER — Ambulatory Visit (INDEPENDENT_AMBULATORY_CARE_PROVIDER_SITE_OTHER): Payer: Medicare Other | Admitting: Family Medicine

## 2022-11-13 ENCOUNTER — Telehealth: Payer: Self-pay | Admitting: Family Medicine

## 2022-11-13 DIAGNOSIS — Z952 Presence of prosthetic heart valve: Secondary | ICD-10-CM

## 2022-11-13 DIAGNOSIS — Z9889 Other specified postprocedural states: Secondary | ICD-10-CM

## 2022-11-13 DIAGNOSIS — Z7901 Long term (current) use of anticoagulants: Secondary | ICD-10-CM

## 2022-11-13 NOTE — Progress Notes (Signed)
Pt stated that she takes 2.5 mg  m/w/su and 5 mg all other days. She also stated that she had missed 1 dose last week.  I informed Dr. Linford Arnold and she stated that she would like for her to do 2.5 mg 2 days and 5mg  5 days and recheck in 2 weeks.  She stated that she would do the 2.5 mg  on Sun/Mon and 5 mg all other days.

## 2022-11-13 NOTE — Telephone Encounter (Signed)
See inr note

## 2022-11-13 NOTE — Progress Notes (Signed)
Please call patient and confirm with her what she is taking.  I believe she is taking 5 mg 3 days a week and 2.5 the other 4 days.  If that is accurate then I need her to go up to 5 mg 4 days a week, and 2.5, 3 days a week. Recheck in 2 weeks   If she is taking it differently then please let me know so that I can make an adjustment.

## 2022-11-23 ENCOUNTER — Encounter: Payer: Self-pay | Admitting: Family Medicine

## 2022-11-24 ENCOUNTER — Encounter: Payer: Self-pay | Admitting: Family Medicine

## 2022-11-24 ENCOUNTER — Ambulatory Visit (INDEPENDENT_AMBULATORY_CARE_PROVIDER_SITE_OTHER): Payer: Medicare Other | Admitting: Family Medicine

## 2022-11-24 VITALS — Ht 62.0 in | Wt 150.0 lb

## 2022-11-24 DIAGNOSIS — R300 Dysuria: Secondary | ICD-10-CM | POA: Diagnosis not present

## 2022-11-24 DIAGNOSIS — N760 Acute vaginitis: Secondary | ICD-10-CM

## 2022-11-24 LAB — POCT URINALYSIS DIP (CLINITEK)
Bilirubin, UA: NEGATIVE
Blood, UA: NEGATIVE
Glucose, UA: 500 mg/dL — AB
Ketones, POC UA: NEGATIVE mg/dL
Leukocytes, UA: NEGATIVE
Nitrite, UA: NEGATIVE
POC PROTEIN,UA: NEGATIVE
Spec Grav, UA: 1.01 (ref 1.010–1.025)
Urobilinogen, UA: 0.2 U/dL
pH, UA: 5.5 (ref 5.0–8.0)

## 2022-11-24 NOTE — Progress Notes (Signed)
   Acute Office Visit  Subjective:     Patient ID: Erin Hill, adult    DOB: Mar 27, 1955, 67 y.o.   MRN: [id number]  Chief Complaint  Patient presents with   Urinary Tract Infection    HPI Patient is in today for dysuria and blood in the urine.  She says yesterday she noticed a little bit of mild itching and when she went to the bathroom just noticed that the urine looked really cloudy and had an odor to it and then noticed a little blood when she wiped.    Diabetes is currently uncontrolled right now with an A1c of 9.9.  She was recently started on dapagliflozin 5 mg.  Says her blood sugars since starting the [place] have gone way down and she has been seeing some 120s and 130s which is fantastic.   ROS      Objective:    There were no vitals taken for this visit.   Physical Exam Vitals and nursing note reviewed.  Constitutional:      Appearance: Normal appearance.  HENT:     Head: Normocephalic and atraumatic.  Pulmonary:     Effort: Pulmonary effort is normal.  Neurological:     Mental Status: She is alert.  Psychiatric:        Mood and Affect: Mood normal.     Results for orders placed or performed in visit on 11/24/22  POCT URINALYSIS DIP (CLINITEK)  Result Value Ref Range   Color, UA yellow yellow   Clarity, UA clear clear   Glucose, UA =500 (A) negative mg/dL   Bilirubin, UA negative negative   Ketones, POC UA negative negative mg/dL   Spec Grav, UA 1.191 4.782 - 1.025   Blood, UA negative negative   pH, UA 5.5 5.0 - 8.0   POC PROTEIN,UA negative negative, trace   Urobilinogen, UA 0.2 0.2 or 1.0 E.U./dL   Nitrite, UA Negative Negative   Leukocytes, UA Negative Negative        Assessment & Plan:   Problem List Items Addressed This Visit   None Visit Diagnoses     Dysuria    -  Primary   Relevant Orders   POCT URINALYSIS DIP (CLINITEK) (Completed)   Urine Culture   Acute vaginitis       Relevant Orders   WET PREP FOR TRICH, YEAST, CLUE       Dysuria-she actually has not had any symptoms today it was just yesterday.  The urinalysis looks negative for acute UTI but will send for culture.  Make sure staying well-hydrated and avoid really acidic foods which can irritate the bladder.  Vaginitis-wet prep performed will call with results once available.  No orders of the defined types were placed in this encounter.   No follow-ups on file.  Nani Gasser, MD

## 2022-11-25 NOTE — Progress Notes (Signed)
Vaginal swab was negative for yeast or bacteria.  The urine culture is still pending.

## 2022-11-27 LAB — URINE CULTURE

## 2022-11-27 LAB — WET PREP FOR TRICH, YEAST, CLUE
Clue Cell Exam: NEGATIVE
Trichomonas Exam: NEGATIVE
Yeast Exam: NEGATIVE

## 2022-12-01 MED ORDER — SULFAMETHOXAZOLE-TRIMETHOPRIM 800-160 MG PO TABS: 1.0000 | ORAL_TABLET | Freq: Two times a day (BID) | ORAL | 0 refills | Status: DC

## 2022-12-01 NOTE — Progress Notes (Signed)
Hi Leelah, your urine culture came back positive.  I am going to go ahead and send over a prescription Bactrim.

## 2022-12-01 NOTE — Addendum Note (Signed)
Addended by: Nani Gasser D on: 12/01/2022 07:29 AM   Modules accepted: Orders

## 2022-12-09 NOTE — Progress Notes (Signed)
[name] looks great at 2.9.

## 2022-12-26 ENCOUNTER — Other Ambulatory Visit: Payer: Self-pay | Admitting: Family Medicine

## 2022-12-26 DIAGNOSIS — E1169 Type 2 diabetes mellitus with other specified complication: Secondary | ICD-10-CM

## 2023-01-11 LAB — HM MAMMOGRAPHY

## 2023-01-13 ENCOUNTER — Encounter: Payer: Self-pay | Admitting: Family Medicine

## 2023-01-19 ENCOUNTER — Encounter: Payer: Self-pay | Admitting: Family Medicine

## 2023-01-19 ENCOUNTER — Telehealth: Payer: Self-pay | Admitting: Family Medicine

## 2023-01-19 NOTE — Telephone Encounter (Signed)
Mychart sent.

## 2023-01-28 ENCOUNTER — Ambulatory Visit: Payer: Medicare Other | Admitting: Family Medicine

## 2023-01-28 ENCOUNTER — Encounter: Payer: Self-pay | Admitting: Family Medicine

## 2023-01-28 VITALS — BP 148/66 | HR 69 | Resp 14 | Ht 62.0 in | Wt 149.9 lb

## 2023-01-28 DIAGNOSIS — Z7984 Long term (current) use of oral hypoglycemic drugs: Secondary | ICD-10-CM | POA: Diagnosis not present

## 2023-01-28 DIAGNOSIS — E1169 Type 2 diabetes mellitus with other specified complication: Secondary | ICD-10-CM | POA: Diagnosis not present

## 2023-01-28 DIAGNOSIS — I10 Essential (primary) hypertension: Secondary | ICD-10-CM

## 2023-01-28 LAB — POCT GLYCOSYLATED HEMOGLOBIN (HGB A1C): Hemoglobin A1C: 5.9 % — AB (ref 4.0–5.6)

## 2023-01-28 NOTE — Progress Notes (Signed)
Established Patient Office Visit  Subjective   Patient ID: Erin Hill, adult    DOB: [birth year]  Age: 67 y.o. MRN: [id number]  Chief Complaint  Patient presents with   Diabetes    HPI  Diabetes - no hypoglycemic events. No wounds or sores that are not healing well. No increased thirst or urination. Checking glucose at home. Taking medications as prescribed without any side effects.  Reports she reports has been getting some elevated blood pressures at home.  Hypertension- Pt denies chest pain, SOB, dizziness, or heart palpitations.  Taking meds as directed w/o problems.  Denies medication side effects.       ROS    Objective:     BP (!) 148/66   Pulse 69   Resp 14   Ht 5\' 2"  (1.575 m)   Wt 149 lb 14.4 oz (68 kg)   SpO2 100%   BMI 27.42 kg/m    Physical Exam Vitals and nursing note reviewed.  Constitutional:      Appearance: Normal appearance.  HENT:     Head: Normocephalic and atraumatic.  Eyes:     Conjunctiva/sclera: Conjunctivae normal.  Cardiovascular:     Rate and Rhythm: Normal rate and regular rhythm.  Pulmonary:     Effort: Pulmonary effort is normal.     Breath sounds: Normal breath sounds.  Skin:    General: Skin is warm and dry.  Neurological:     Mental Status: She is alert.  Psychiatric:        Mood and Affect: Mood normal.      Results for orders placed or performed in visit on 01/28/23  POCT HgB A1C  Result Value Ref Range   Hemoglobin A1C 5.9 (A) 4.0 - 5.6 %   HbA1c POC (<> result, manual entry)     HbA1c, POC (prediabetic range)     HbA1c, POC (controlled diabetic range)        The 10-year ASCVD risk score (Arnett DK, et al., 2019) is: 20.8%    Assessment & Plan:   Problem List Items Addressed This Visit       Cardiovascular and Mediastinum   Essential hypertension - Primary   Pressure is elevated today.  Last time she was here it was around 130 and she actually just recently saw cardiology.  She is scheduled for an  echocardiogram in February as she did have a slightly abnormal EKG this last time.  Will have her come back in 2 weeks to recheck him not sure why it so elevated today.  Continue to work on low-salt diet she really struggles with staying hydrated so I really want avoid putting her on a diuretic but that would typically be the next step for better blood pressure control she is already on an ARB, calcium channel blocker and beta-blocker.  And on Farxiga which does have a mild diuretic effect.        Endocrine   Type 2 diabetes mellitus with other specified complication (HCC)   A1c is 5.9 today which is great.  Continue current regimen she is doing well on the [place] she has noticed a little bit of increased urination but she has been very purposely trying to increase her water intake to close to 70 ounces a day and says she does noticeably feel better when she hydrates she has less muscle cramps and fatigue.  Continue current regimen.      Relevant Orders   POCT HgB A1C (Completed)  Prevnar 20 - declined but will get next OV>    Return in about 4 months (around 05/29/2023) for Diabetes follow-up, Hypertension.    Nani Gasser, MD

## 2023-01-28 NOTE — Assessment & Plan Note (Addendum)
A1c is 5.9 today which is great.  Continue current regimen she is doing well on the [place] she has noticed a little bit of increased urination but she has been very purposely trying to increase her water intake to close to 70 ounces a day and says she does noticeably feel better when she hydrates she has less muscle cramps and fatigue.  Continue current regimen.

## 2023-01-28 NOTE — Patient Instructions (Signed)
Follow up in 2 weeks for bp check with nurse.

## 2023-01-28 NOTE — Assessment & Plan Note (Signed)
Pressure is elevated today.  Last time she was here it was around 130 and she actually just recently saw cardiology.  She is scheduled for an echocardiogram in February as she did have a slightly abnormal EKG this last time.  Will have her come back in 2 weeks to recheck him not sure why it so elevated today.  Continue to work on low-salt diet she really struggles with staying hydrated so I really want avoid putting her on a diuretic but that would typically be the next step for better blood pressure control she is already on an ARB, calcium channel blocker and beta-blocker.  And on Farxiga which does have a mild diuretic effect.

## 2023-01-29 ENCOUNTER — Encounter: Payer: Self-pay | Admitting: Family Medicine

## 2023-01-29 DIAGNOSIS — N182 Chronic kidney disease, stage 2 (mild): Secondary | ICD-10-CM | POA: Insufficient documentation

## 2023-02-04 ENCOUNTER — Encounter: Payer: Self-pay | Admitting: Family Medicine

## 2023-02-04 ENCOUNTER — Other Ambulatory Visit: Payer: Self-pay | Admitting: Family Medicine

## 2023-02-08 ENCOUNTER — Encounter: Payer: Self-pay | Admitting: Family Medicine

## 2023-02-08 ENCOUNTER — Ambulatory Visit (INDEPENDENT_AMBULATORY_CARE_PROVIDER_SITE_OTHER): Payer: Medicare Other

## 2023-02-08 VITALS — BP 125/58 | HR 77

## 2023-02-08 DIAGNOSIS — I10 Essential (primary) hypertension: Secondary | ICD-10-CM

## 2023-02-08 NOTE — Progress Notes (Signed)
   Established Patient Office Visit  Subjective   Patient ID: Erin Hill, adult    DOB: [birth year]  Age: 67 y.o. MRN: [id number]  Chief Complaint  Patient presents with   Hypertension    HPI  AARIONNA AZIZI is here for blood pressure check. Denies chest pain, shortness of breath or dizziness.   ROS    Objective:     BP (!) 125/58   Pulse 77   SpO2 99%    Physical Exam   No results found for any visits on 02/08/23.    The 10-year ASCVD risk score (Arnett DK, et al., 2019) is: 15.2%    Assessment & Plan:  Blood pressure check - Patient advised to continue current medications as directed.   Problem List Items Addressed This Visit       Unprioritized   Essential hypertension - Primary    Return in about 3 months (around 05/09/2023) for HTN with Dr Linford Arnold. Earna Coder, Janalyn Harder, CMA

## 2023-02-13 ENCOUNTER — Ambulatory Visit
Admission: RE | Admit: 2023-02-13 | Discharge: 2023-02-13 | Disposition: A | Discharge status: Home / Self Care | Payer: Medicare Other | Source: Ambulatory Visit | Attending: Family Medicine | Admitting: Family Medicine

## 2023-02-13 ENCOUNTER — Other Ambulatory Visit: Payer: Self-pay

## 2023-02-13 VITALS — BP 156/72 | HR 71 | Temp 97.9°F | Resp 16

## 2023-02-13 DIAGNOSIS — J0100 Acute maxillary sinusitis, unspecified: Secondary | ICD-10-CM | POA: Diagnosis not present

## 2023-02-13 DIAGNOSIS — R059 Cough, unspecified: Secondary | ICD-10-CM

## 2023-02-13 DIAGNOSIS — J309 Allergic rhinitis, unspecified: Secondary | ICD-10-CM | POA: Diagnosis not present

## 2023-02-13 MED ORDER — AMOXICILLIN-POT CLAVULANATE 875-125 MG PO TABS: 1.0000 | ORAL_TABLET | Freq: Two times a day (BID) | ORAL | 0 refills | Status: DC

## 2023-02-13 MED ORDER — BENZONATATE 200 MG PO CAPS: 200.0000 mg | ORAL_CAPSULE | Freq: Three times a day (TID) | ORAL | 0 refills | Status: AC | PRN

## 2023-02-13 NOTE — ED Provider Notes (Signed)
[facility]    CSN: [id number] Arrival date & time: 02/13/23  0949      History   Chief Complaint Chief Complaint  Patient presents with   Cough    Appt 945A   Sore Throat   Hoarse    HPI Erin Hill is a 67 y.o. adult.   HPI 67 year old female presents with cough, chest congestion, and hoarse feeling and throat.  PMH significant for atrial flutter and rheumatic heart disease.  Patient is currently on Coumadin and denies any unusual bleeding.  Past Medical History:  Diagnosis Date   Atrial flutter (HCC)    2015   Hearing loss    chronic   Miscarriage    X 2   Proteinuria    chronic (not related to DM)   Rheumatic heart disease    s/p aortic/ mitral valve replace    Patient Active Problem List   Diagnosis Date Noted   CKD (chronic kidney disease) stage 2, GFR 60-89 ml/min 01/29/2023   Encounter for monitoring Coumadin therapy 10/29/2022   Allergic reaction to food 09/09/2022   Multiple food allergies 09/09/2022   Chronic left shoulder pain 07/25/2021   Asymmetrical hearing loss 12/12/2020   Type 2 diabetes mellitus with other specified complication (HCC) 07/21/2017   Diastolic dysfunction 06/12/2015   Metacarpophalangeal joint pain of right hand 06/05/2013   PVC's (premature ventricular contractions) 06/05/2013   Pulmonary hypertension (HCC) 03/09/2013   Atrial flutter with rapid ventricular response (HCC) 02/23/2013   Trigger finger, acquired 08/02/2012   Mitral valve disease 10/13/2011   Allergy to IVP dye 08/27/2011   Long term (current) use of anticoagulants 08/27/2011   Normal coronary arteries 08/27/2011   Rheumatic heart disease 08/27/2011   MITRAL VALVE REPLACEMENT, HX OF 08/05/2006   Aortic valve replaced 08/05/2006   Hyperlipidemia 11/24/2005   Essential hypertension 11/24/2005    Past Surgical History:  Procedure Laterality Date   double heart valve replacement  1999   echo EF 75-80%     SAB     X 2   stapes surgery, left   2003   then removed 2 years later   TAB      X 1   TONSILLECTOMY  as a child    OB History   No obstetric history on file.      Home Medications    Prior to Admission medications   Medication Sig Start Date End Date Taking? Authorizing Provider  amoxicillin-clavulanate (AUGMENTIN) 875-125 MG tablet Take 1 tablet by mouth every 12 (twelve) hours. 02/13/23  Yes Trevor Iha, FNP  benzonatate (TESSALON) 200 MG capsule Take 1 capsule (200 mg total) by mouth 3 (three) times daily as needed for up to 7 days. 02/13/23 02/20/23 Yes Trevor Iha, FNP  ACCU-CHEK GUIDE test strip CHECK FASTING BLOOD SUGAR EVERY MORNING. DX: E11.9 10/27/22   Agapito Games, MD  AMBULATORY NON FORMULARY MEDICATION Take 1 capsule by mouth daily. Medication Name: [facility] TUMERIC,GARLIC,GINGER,ROSEMARY,CINNAMON    [provider]  amLODipine-olmesartan (AZOR) 5-40 MG tablet Take 1 tablet by mouth daily. 11/06/22   Agapito Games, MD  Blood Glucose Monitoring Suppl (ACCU-CHEK AVIVA PLUS) w/Device KIT Use to check blood sugar twice daily. Dx: R73.01 07/28/17   Agapito Games, MD  Coenzyme Q-10 100 MG capsule Take 300 mg by mouth daily.    [provider]  dapagliflozin propanediol (FARXIGA) 5 MG TABS tablet Take 1 tablet (5 mg total) by mouth daily before breakfast. 11/09/22  Agapito Games, MD  fexofenadine (ALLEGRA) 180 MG tablet Take 1 tablet (180 mg total) by mouth daily. 09/09/22   Breeback, Jade L, PA-C  Lancets (ACCU-CHEK MULTICLIX) lancets Use to check blood sugar twice daily. Dx: R73.01 07/28/17   Agapito Games, MD  metFORMIN (GLUCOPHAGE-XR) 500 MG 24 hr tablet TAKE 1 TABLET BY MOUTH EVERY DAY WITH BREAKFAST 12/28/22   Agapito Games, MD  metoprolol succinate (TOPROL-XL) 100 MG 24 hr tablet TAKE 1 TABLET BY MOUTH DAILY WITH OR IMMEDIATELY FOLLOWING A MEAL. 02/05/23   Agapito Games, MD  OVER THE COUNTER MEDICATION Super C ( vit D, Vit D,  zinc)    [provider]  rosuvastatin (CRESTOR) 10 MG tablet Take 1 tablet (10 mg total) by mouth daily. 01/27/22   Agapito Games, MD  Vitamin K, Phytonadione, 100 MCG TABS Take 100 mcg daily by mouth.    [provider]  warfarin (COUMADIN) 5 MG tablet TAKE 1 TABLET BY MOUTH EVERY MONDAY,WEDNESDAY, AND FRIDAY. TAKE 1/2 TABLET ALL OTHER DAYS AS DIRECTED BY COUMADIN CLINIC. 10/21/22   Agapito Games, MD    Family History Family History  Problem Relation Age of Onset   Diabetes Father    Lung cancer Mother        smoked until her 26's   Hypertension Mother    Diabetes Father    Hyperlipidemia Father    Hypertension Father     Social History Social History   Tobacco Use   Smoking status: Never   Smokeless tobacco: Never     Allergies   Erythromycin, Gemfibrozil, Shellfish allergy, Shellfish-derived products, Strawberry extract, Versed [midazolam], Iodinated contrast media, Iodine, Lipitor [atorvastatin], Metformin and related, Protonix [pantoprazole sodium], and Strawberry (diagnostic)   Review of Systems Review of Systems  HENT:  Positive for congestion, postnasal drip and sinus pressure.   All other systems reviewed and are negative.    Physical Exam Triage Vital Signs ED Triage Vitals  Encounter Vitals Group     BP 02/13/23 1025 (!) 156/72     Systolic BP Percentile --      Diastolic BP Percentile --      Pulse Rate 02/13/23 1025 71     Resp 02/13/23 1025 16     Temp 02/13/23 1025 97.9 F (36.6 C)     Temp Source 02/13/23 1025 Oral     SpO2 02/13/23 1025 98 %     Weight --      Height --      Head Circumference --      Peak Flow --      Pain Score 02/13/23 1026 0     Pain Loc --      Pain Education --      Exclude from Growth Chart --    No data found.  Updated Vital Signs BP (!) 156/72 (BP Location: Left Arm)   Pulse 71   Temp 97.9 F (36.6 C) (Oral)   Resp 16   SpO2 98%   Visual Acuity Right Eye Distance:   Left  Eye Distance:   Bilateral Distance:    Right Eye Near:   Left Eye Near:    Bilateral Near:     Physical Exam Vitals and nursing note reviewed.  Constitutional:      Appearance: Normal appearance. She is normal weight. She is ill-appearing.  HENT:     Head: Normocephalic and atraumatic.     Right Ear: Tympanic membrane and external ear normal.  Left Ear: Tympanic membrane and external ear normal.     Ears:     Comments: Moderate eustachian tube dysfunction noted bilaterally    Nose:     Comments: Turbinates are erythematous/edematous    Mouth/Throat:     Mouth: Mucous membranes are moist.     Pharynx: Oropharynx is clear.     Comments: Significant amount of clear drainage of posterior oropharynx noted Eyes:     Conjunctiva/sclera: Conjunctivae normal.     Pupils: Pupils are equal, round, and reactive to light.  Cardiovascular:     Rate and Rhythm: Normal rate and regular rhythm.     Pulses: Normal pulses.     Heart sounds: Normal heart sounds.  Pulmonary:     Effort: Pulmonary effort is normal.     Breath sounds: Normal breath sounds. No wheezing, rhonchi or rales.     Comments: Infrequent nonproductive cough on exam Musculoskeletal:        General: Normal range of motion.     Cervical back: Normal range of motion and neck supple.  Skin:    General: Skin is warm and dry.  Neurological:     General: No focal deficit present.     Mental Status: She is alert and oriented to person, place, and time. Mental status is at baseline.  Psychiatric:        Mood and Affect: Mood normal.        Behavior: Behavior normal.      UC Treatments / Results  Labs (all labs ordered are listed, but only abnormal results are displayed) Labs Reviewed - No data to display  EKG   Radiology No results found.  Procedures Procedures (including critical care time)  Medications Ordered in UC Medications - No data to display  Initial Impression / Assessment and Plan / UC Course  I  have reviewed the triage vital signs and the nursing notes.  Pertinent labs & imaging results that were available during my care of the patient were reviewed by me and considered in my medical decision making (see chart for details).     MDM: 1.  Subacute maxillary sinusitis-Rx'd Augmentin 875/125 mg tablet: Take 1 tablet twice daily x 7 days; 2.  Allergic rhinitis, unspecified seasonality, unspecified trigger-advised patient to take OTC Allegra 180 mg fexofenadine daily x 5 days; 3.  Cough, unspecified type-Rx'd Tessalon 200 mg capsules: Take 1 capsule 3 times daily, as needed for cough. Advised patient to take medications as directed with food to completion.  Advised patient to take Allegra with first dose of Augmentin for the next 5 of 7 days.  Advised may use Allegra as needed for postnasal drainage/drip.  Advised may use Tessalon capsules daily or as needed for cough.  Encouraged to increase daily water intake to 64 ounces per day while taking these medications.  Advised if symptoms worsen and/or unresolved please follow-up with PCP or here for further evaluation.  Patient discharged home, hemodynamically stable. Final Clinical Impressions(s) / UC Diagnoses   Final diagnoses:  Subacute maxillary sinusitis  Allergic rhinitis, unspecified seasonality, unspecified trigger  Cough, unspecified type     Discharge Instructions      Advised patient to take medications as directed with food to completion.  Advised patient to take Allegra with first dose of Augmentin for the next 5 of 7 days.  Advised may use Allegra as needed for postnasal drainage/drip.  Advised may use Tessalon capsules daily or as needed for cough.  Encouraged to increase daily  water intake to 64 ounces per day while taking these medications.  Advised if symptoms worsen and/or unresolved please follow-up with PCP or here for further evaluation.     ED Prescriptions     Medication Sig Dispense Auth. Provider    amoxicillin-clavulanate (AUGMENTIN) 875-125 MG tablet Take 1 tablet by mouth every 12 (twelve) hours. 14 tablet Trevor Iha, FNP   benzonatate (TESSALON) 200 MG capsule Take 1 capsule (200 mg total) by mouth 3 (three) times daily as needed for up to 7 days. 40 capsule Trevor Iha, FNP      PDMP not reviewed this encounter.   Trevor Iha, FNP 02/13/23 1046

## 2023-02-13 NOTE — ED Triage Notes (Signed)
Pt c/o cough, chest congestion and hoarse feeling in throat. Denies fever. COVID test neg at home with am. Tylenol prn.

## 2023-02-13 NOTE — Discharge Instructions (Addendum)
Advised patient to take medications as directed with food to completion.  Advised patient to take Allegra with first dose of Augmentin for the next 5 of 7 days.  Advised may use Allegra as needed for postnasal drainage/drip.  Advised may use Tessalon capsules daily or as needed for cough.  Encouraged to increase daily water intake to 64 ounces per day while taking these medications.  Advised if symptoms worsen and/or unresolved please follow-up with PCP or here for further evaluation.

## 2023-02-16 ENCOUNTER — Ambulatory Visit: Payer: Medicare Other | Admitting: Physician Assistant

## 2023-02-28 ENCOUNTER — Encounter: Payer: Self-pay | Admitting: Family Medicine

## 2023-02-28 DIAGNOSIS — E1169 Type 2 diabetes mellitus with other specified complication: Secondary | ICD-10-CM

## 2023-03-01 ENCOUNTER — Other Ambulatory Visit: Payer: Self-pay

## 2023-03-01 ENCOUNTER — Ambulatory Visit
Admission: RE | Admit: 2023-03-01 | Discharge: 2023-03-01 | Disposition: A | Discharge status: Home / Self Care | Payer: Medicare Other | Source: Ambulatory Visit | Attending: Family Medicine | Admitting: Family Medicine

## 2023-03-01 VITALS — BP 152/82 | HR 82 | Temp 97.5°F | Resp 16

## 2023-03-01 DIAGNOSIS — N3000 Acute cystitis without hematuria: Secondary | ICD-10-CM | POA: Diagnosis not present

## 2023-03-01 LAB — POCT URINALYSIS DIP (MANUAL ENTRY)
Glucose, UA: >=1000 mg/dL — AB
Leukocytes, UA: NEGATIVE
Nitrite, UA: POSITIVE — AB
Protein Ur, POC: 100 mg/dL — AB
Spec Grav, UA: 1.015 (ref 1.010–1.025)
Urobilinogen, UA: 0.2 U/dL
pH, UA: 5.5 (ref 5.0–8.0)

## 2023-03-01 MED ORDER — NITROFURANTOIN MONOHYD MACRO 100 MG PO CAPS: 100.0000 mg | ORAL_CAPSULE | Freq: Two times a day (BID) | ORAL | 0 refills | Status: DC

## 2023-03-01 MED ORDER — ROSUVASTATIN CALCIUM 10 MG PO TABS: 10.0000 mg | ORAL_TABLET | Freq: Every day | ORAL | 1 refills | Status: DC

## 2023-03-01 NOTE — Discharge Instructions (Addendum)
 Make sure you continue to drink lots of water Take the Macrobid 2 times a day for 5 days  We have sent your urine to the laboratory for culture.  You will be called if any change in antibiotic is necessary Follow-up with Dr. Linford Arnold

## 2023-03-01 NOTE — ED Provider Notes (Signed)
 [facility]    CSN: [id number] Arrival date & time: 03/01/23  9147      History   Chief Complaint Chief Complaint  Patient presents with   Urinary Frequency    I have a UTI. Pain when urinating. Blood on tp. - Entered by patient    HPI NATALINE BASARA is a 68 y.o. adult.   Patient is on Farxiga  for her diabetes management.  She states that she has been on this she has had increased urinary tract infections.  She has discussed this with her primary care doctor.  She is here today for recurrent infection.  She has dysuria and frequency, suprapubic pressure no fever or chills, no flank pain, no nausea or vomiting to indicate kidney involvement    Past Medical History:  Diagnosis Date   Atrial flutter (HCC)    2015   Hearing loss    chronic   Miscarriage    X 2   Proteinuria    chronic (not related to DM)   Rheumatic heart disease    s/p aortic/ mitral valve replace    Patient Active Problem List   Diagnosis Date Noted   CKD (chronic kidney disease) stage 2, GFR 60-89 ml/min 01/29/2023   Encounter for monitoring Coumadin therapy 10/29/2022   Allergic reaction to food 09/09/2022   Multiple food allergies 09/09/2022   Chronic left shoulder pain 07/25/2021   Asymmetrical hearing loss 12/12/2020   Type 2 diabetes mellitus with other specified complication (HCC) 07/21/2017   Diastolic dysfunction 06/12/2015   Metacarpophalangeal joint pain of right hand 06/05/2013   PVC's (premature ventricular contractions) 06/05/2013   Pulmonary hypertension (HCC) 03/09/2013   Atrial flutter with rapid ventricular response (HCC) 02/23/2013   Trigger finger, acquired 08/02/2012   Mitral valve disease 10/13/2011   Allergy to IVP dye 08/27/2011   Long term (current) use of anticoagulants 08/27/2011   Normal coronary arteries 08/27/2011   Rheumatic heart disease 08/27/2011   MITRAL VALVE REPLACEMENT, HX OF 08/05/2006   Aortic valve replaced 08/05/2006   Hyperlipidemia  11/24/2005   Essential hypertension 11/24/2005    Past Surgical History:  Procedure Laterality Date   double heart valve replacement  1999   echo EF 75-80%     SAB     X 2   stapes surgery, left  2003   then removed 2 years later   TAB      X 1   TONSILLECTOMY  as a child    OB History   No obstetric history on file.      Home Medications    Prior to Admission medications   Medication Sig Start Date End Date Taking? Authorizing Provider  nitrofurantoin , macrocrystal-monohydrate, (MACROBID ) 100 MG capsule Take 1 capsule (100 mg total) by mouth 2 (two) times daily. 03/01/23  Yes Maranda Jamee Jacob, MD  ACCU-CHEK GUIDE test strip CHECK FASTING BLOOD SUGAR EVERY MORNING. DX: E11.9 10/27/22   Alvan Dorothyann BIRCH, MD  AMBULATORY NON FORMULARY MEDICATION Take 1 capsule by mouth daily. Medication Name: [facility] TUMERIC,GARLIC,GINGER,ROSEMARY,CINNAMON    [provider]  amLODipine -olmesartan  (AZOR ) 5-40 MG tablet Take 1 tablet by mouth daily. 11/06/22   Alvan Dorothyann BIRCH, MD  Blood Glucose Monitoring Suppl (ACCU-CHEK AVIVA PLUS) w/Device KIT Use to check blood sugar twice daily. Dx: R73.01 07/28/17   Alvan Dorothyann BIRCH, MD  Coenzyme Q-10 100 MG capsule Take 300 mg by mouth daily.    [provider]  dapagliflozin  propanediol (FARXIGA ) 5 MG TABS  tablet Take 1 tablet (5 mg total) by mouth daily before breakfast. 11/09/22   Alvan Dorothyann BIRCH, MD  fexofenadine  (ALLEGRA ) 180 MG tablet Take 1 tablet (180 mg total) by mouth daily. 09/09/22   Breeback, Jade L, PA-C  Lancets (ACCU-CHEK MULTICLIX) lancets Use to check blood sugar twice daily. Dx: R73.01 07/28/17   Alvan Dorothyann BIRCH, MD  metFORMIN  (GLUCOPHAGE -XR) 500 MG 24 hr tablet TAKE 1 TABLET BY MOUTH EVERY DAY WITH BREAKFAST 12/28/22   Alvan Dorothyann BIRCH, MD  metoprolol  succinate (TOPROL -XL) 100 MG 24 hr tablet TAKE 1 TABLET BY MOUTH DAILY WITH OR IMMEDIATELY FOLLOWING A MEAL. 02/05/23   Alvan Dorothyann BIRCH, MD  OVER THE COUNTER MEDICATION Super C ( vit D, Vit D, zinc)    [provider]  rosuvastatin  (CRESTOR ) 10 MG tablet Take 1 tablet (10 mg total) by mouth daily. 01/27/22   Alvan Dorothyann BIRCH, MD  Vitamin K, Phytonadione, 100 MCG TABS Take 100 mcg daily by mouth.    [provider]  warfarin (COUMADIN) 5 MG tablet TAKE 1 TABLET BY MOUTH EVERY MONDAY,WEDNESDAY, AND FRIDAY. TAKE 1/2 TABLET ALL OTHER DAYS AS DIRECTED BY COUMADIN CLINIC. 10/21/22   Alvan Dorothyann BIRCH, MD    Family History Family History  Problem Relation Age of Onset   Diabetes Father    Lung cancer Mother        smoked until her 15's   Hypertension Mother    Diabetes Father    Hyperlipidemia Father    Hypertension Father     Social History Social History   Tobacco Use   Smoking status: Never   Smokeless tobacco: Never     Allergies   Erythromycin, Gemfibrozil, Shellfish allergy, Shellfish-derived products, Strawberry extract, Versed [midazolam], Iodinated contrast media, Lipitor [atorvastatin ], and Protonix [pantoprazole sodium]   Review of Systems Review of Systems   Physical Exam Triage Vital Signs ED Triage Vitals  Encounter Vitals Group     BP 03/01/23 0859 (!) 152/82     Systolic BP Percentile --      Diastolic BP Percentile --      Pulse Rate 03/01/23 0859 82     Resp 03/01/23 0859 16     Temp 03/01/23 0859 (!) 97.5 F (36.4 C)     Temp src --      SpO2 03/01/23 0859 97 %     Weight --      Height --      Head Circumference --      Peak Flow --      Pain Score 03/01/23 0902 5     Pain Loc --      Pain Education --      Exclude from Growth Chart --    No data found.  Updated Vital Signs BP (!) 152/82   Pulse 82   Temp (!) 97.5 F (36.4 C)   Resp 16   SpO2 97%      Physical Exam   UC Treatments / Results  Labs (all labs ordered are listed, but only abnormal results are displayed) Labs Reviewed  POCT URINALYSIS DIP (MANUAL ENTRY) -  Abnormal; Notable for the following components:      Result Value   Color, UA red (*)    Clarity, UA cloudy (*)    Glucose, UA >=1,000 (*)    Bilirubin, UA small (*)    Ketones, POC UA small (15) (*)    Blood, UA large (*)    Protein Ur, POC =100 (*)  Nitrite, UA Positive (*)    All other components within normal limits  URINE CULTURE    EKG   Radiology No results found.  Procedures Procedures (including critical care time)  Medications Ordered in UC Medications - No data to display  Initial Impression / Assessment and Plan / UC Course  I have reviewed the triage vital signs and the nursing notes.  Pertinent labs & imaging results that were available during my care of the patient were reviewed by me and considered in my medical decision making (see chart for details).     Urine is sent for culture. Patient states that she was given Bactrim  for her last infection.  Has I ordered this, the computer notifies me there may be an reaction with her Coumadin.  I therefore switched her to Macrodantin  Final Clinical Impressions(s) / UC Diagnoses   Final diagnoses:  Acute cystitis without hematuria     Discharge Instructions      Make sure you continue to drink lots of water Take the Macrobid  2 times a day for 5 days  We have sent your urine to the laboratory for culture.  You will be called if any change in antibiotic is necessary Follow-up with Dr. Metheney   ED Prescriptions     Medication Sig Dispense Auth. Provider   nitrofurantoin , macrocrystal-monohydrate, (MACROBID ) 100 MG capsule Take 1 capsule (100 mg total) by mouth 2 (two) times daily. 10 capsule Maranda Jamee Jacob, MD      PDMP not reviewed this encounter.   Maranda Jamee Jacob, MD 03/01/23 [phone]

## 2023-03-01 NOTE — ED Triage Notes (Signed)
 Symptoms since yesterday but sore perineum for about a week. Pressure, feeling need to urinate constantly, blood when wiping and in toilet. No otc meds.

## 2023-03-03 LAB — URINE CULTURE
Culture: >=100000 — AB
Special Requests: NORMAL

## 2023-03-05 MED ORDER — SITAGLIPTIN PHOSPHATE 100 MG PO TABS: 100.0000 mg | ORAL_TABLET | Freq: Every day | ORAL | 1 refills | Status: DC

## 2023-03-05 NOTE — Addendum Note (Signed)
Addended by: Nani Gasser D on: 03/05/2023 08:12 AM   Modules accepted: Orders

## 2023-03-16 ENCOUNTER — Encounter: Payer: Self-pay | Admitting: Family Medicine

## 2023-03-16 ENCOUNTER — Telehealth: Payer: Self-pay | Admitting: Family Medicine

## 2023-03-16 NOTE — Telephone Encounter (Signed)
Pt dropped off Patient Assistance Program Enrollment form. Form was placed in Dr's box today.

## 2023-03-16 NOTE — Progress Notes (Signed)
[name] at goal at 2.7.  Continue current regimen.

## 2023-03-17 NOTE — Telephone Encounter (Signed)
Called pt and lvm advising her that the forms are up front ready for p/u

## 2023-03-30 LAB — PROTIME-INR: INR: 2.4 — AB (ref 0.80–1.20)

## 2023-04-01 ENCOUNTER — Ambulatory Visit: Payer: Self-pay | Admitting: Family Medicine

## 2023-04-01 ENCOUNTER — Encounter: Payer: Self-pay | Admitting: Family Medicine

## 2023-04-01 DIAGNOSIS — Z7901 Long term (current) use of anticoagulants: Secondary | ICD-10-CM

## 2023-04-01 DIAGNOSIS — Z9889 Other specified postprocedural states: Secondary | ICD-10-CM

## 2023-04-01 DIAGNOSIS — Z952 Presence of prosthetic heart valve: Secondary | ICD-10-CM

## 2023-04-01 NOTE — Progress Notes (Signed)
HI Krislyn, your [name] is 2.4 which is just below therapeutic goal.  Plan to recheck next time and if still under 2.5 we will adjust the coumadin.

## 2023-04-12 ENCOUNTER — Encounter: Payer: Self-pay | Admitting: Cardiology

## 2023-04-14 ENCOUNTER — Telehealth: Payer: Self-pay | Admitting: Family Medicine

## 2023-04-14 NOTE — Telephone Encounter (Signed)
 Mychart note sent re: [name]

## 2023-04-15 ENCOUNTER — Encounter: Payer: Self-pay | Admitting: Family Medicine

## 2023-04-16 ENCOUNTER — Encounter: Payer: Self-pay | Admitting: Family Medicine

## 2023-04-16 DIAGNOSIS — E1169 Type 2 diabetes mellitus with other specified complication: Secondary | ICD-10-CM

## 2023-04-16 DIAGNOSIS — M79605 Pain in left leg: Secondary | ICD-10-CM

## 2023-04-17 LAB — CK: Total CK: 87 U/L (ref 32–182)

## 2023-04-17 LAB — CMP14+EGFR
ALT: 28 IU/L (ref 0–32)
AST: 32 IU/L (ref 0–40)
Albumin: 4.1 g/dL (ref 3.9–4.9)
Alkaline Phosphatase: 49 IU/L (ref 44–121)
BUN/Creatinine Ratio: 31 — ABNORMAL HIGH (ref 12–28)
BUN: 34 mg/dL — ABNORMAL HIGH (ref 8–27)
Bilirubin Total: 0.3 mg/dL (ref 0.0–1.2)
CO2: 23 mmol/L (ref 20–29)
Calcium: 9.6 mg/dL (ref 8.7–10.3)
Chloride: 98 mmol/L (ref 96–106)
Creatinine, Ser: 1.09 mg/dL — ABNORMAL HIGH (ref 0.57–1.00)
Globulin, Total: 2.4 g/dL (ref 1.5–4.5)
Glucose: 89 mg/dL (ref 70–99)
Potassium: 4.4 mmol/L (ref 3.5–5.2)
Sodium: 137 mmol/L (ref 134–144)
Total Protein: 6.5 g/dL (ref 6.0–8.5)
eGFR: 56 mL/min/{1.73_m2} — ABNORMAL LOW (ref >59)

## 2023-04-17 LAB — MAGNESIUM: Magnesium: 1.7 mg/dL (ref 1.6–2.3)

## 2023-04-19 ENCOUNTER — Encounter: Payer: Self-pay | Admitting: Family Medicine

## 2023-04-19 NOTE — Progress Notes (Signed)
 Hi Lujean, your kidney function is stable. No sign of excess muscle breakdown. Normal magnesium

## 2023-04-21 ENCOUNTER — Other Ambulatory Visit: Payer: Self-pay | Admitting: Family Medicine

## 2023-04-21 DIAGNOSIS — E1169 Type 2 diabetes mellitus with other specified complication: Secondary | ICD-10-CM

## 2023-05-04 ENCOUNTER — Other Ambulatory Visit: Payer: Self-pay | Admitting: Family Medicine

## 2023-05-04 DIAGNOSIS — E1169 Type 2 diabetes mellitus with other specified complication: Secondary | ICD-10-CM

## 2023-05-05 ENCOUNTER — Other Ambulatory Visit: Payer: Self-pay | Admitting: Family Medicine

## 2023-05-26 ENCOUNTER — Encounter: Payer: Self-pay | Admitting: Family Medicine

## 2023-05-26 NOTE — Progress Notes (Signed)
[name] is good.

## 2023-05-27 ENCOUNTER — Ambulatory Visit (INDEPENDENT_AMBULATORY_CARE_PROVIDER_SITE_OTHER): Payer: Medicare Other | Admitting: Family Medicine

## 2023-05-27 ENCOUNTER — Encounter: Payer: Self-pay | Admitting: Family Medicine

## 2023-05-27 VITALS — BP 128/58 | HR 68 | Ht 62.0 in | Wt 146.0 lb

## 2023-05-27 DIAGNOSIS — N182 Chronic kidney disease, stage 2 (mild): Secondary | ICD-10-CM

## 2023-05-27 DIAGNOSIS — Z9889 Other specified postprocedural states: Secondary | ICD-10-CM

## 2023-05-27 DIAGNOSIS — Z23 Encounter for immunization: Secondary | ICD-10-CM | POA: Diagnosis not present

## 2023-05-27 DIAGNOSIS — I10 Essential (primary) hypertension: Secondary | ICD-10-CM

## 2023-05-27 DIAGNOSIS — Z5181 Encounter for therapeutic drug level monitoring: Secondary | ICD-10-CM

## 2023-05-27 DIAGNOSIS — R233 Spontaneous ecchymoses: Secondary | ICD-10-CM

## 2023-05-27 DIAGNOSIS — Z7901 Long term (current) use of anticoagulants: Secondary | ICD-10-CM

## 2023-05-27 DIAGNOSIS — Z7984 Long term (current) use of oral hypoglycemic drugs: Secondary | ICD-10-CM

## 2023-05-27 DIAGNOSIS — E1169 Type 2 diabetes mellitus with other specified complication: Secondary | ICD-10-CM | POA: Diagnosis not present

## 2023-05-27 LAB — POCT GLYCOSYLATED HEMOGLOBIN (HGB A1C): Hemoglobin A1C: 5.7 % — AB (ref 4.0–5.6)

## 2023-05-27 NOTE — Progress Notes (Signed)
 Established Patient Office Visit  Subjective  Patient ID: Erin Hill, adult    DOB: Apr 12, 1955  Age: 68 y.o. MRN: [id number]  Chief Complaint  Patient presents with   Hypertension   Diabetes    HPI  For follow-up hypertension and blood pressure.  Due for Prevnar 20 and shingles vaccines.  Concern today is that she has noticed a little bit more easy bruising not really on her hands or forearms mostly over her abdomen she will get large bruises where she does not remember hitting or bumping it.  She is on Coumadin but had not really noticed this before.  ROS    Objective:     BP (!) 128/58   Pulse 68   Ht 5\' 2"  (1.575 m)   Wt 146 lb (66.2 kg)   SpO2 98%   BMI 26.70 kg/m     Physical Exam Vitals and nursing note reviewed.  Constitutional:      Appearance: Normal appearance.  HENT:     Head: Normocephalic and atraumatic.  Eyes:     Conjunctiva/sclera: Conjunctivae normal.  Cardiovascular:     Rate and Rhythm: Normal rate and regular rhythm.     Heart sounds: Murmur heard.  Pulmonary:     Effort: Pulmonary effort is normal.     Breath sounds: Normal breath sounds.  Skin:    General: Skin is warm and dry.  Neurological:     Mental Status: She is alert.  Psychiatric:        Mood and Affect: Mood normal.      Results for orders placed or performed in visit on 05/27/23  POCT HgB A1C  Result Value Ref Range   Hemoglobin A1C 5.7 (A) 4.0 - 5.6 %   HbA1c POC (<> result, manual entry)     HbA1c, POC (prediabetic range)     HbA1c, POC (controlled diabetic range)         The ASCVD Risk score (Arnett DK, et al., 2019) failed to calculate for the following reasons:   Risk score cannot be calculated because patient has a medical history suggesting prior/existing ASCVD    Assessment & Plan:   Problem List Items Addressed This Visit       Cardiovascular and Mediastinum   Essential hypertension - Primary   Blood pressure not optimal today.  It looks  fantastic 3 months ago so we will recheck before she goes home today to make sure still at goal.  She is currently on Tribenzor and metoprolol.      Relevant Medications   Olmesartan-amLODIPine-HCTZ 40-5-25 MG TABS     Endocrine   Type 2 diabetes mellitus with other specified complication (HCC)   Relevant Medications   Olmesartan-amLODIPine-HCTZ 40-5-25 MG TABS   Other Relevant Orders   POCT HgB A1C (Completed)     Genitourinary   CKD (chronic kidney disease) stage 2, GFR 60-89 ml/min   Last serum creatinine 1.0 in February.        Other   MITRAL VALVE REPLACEMENT, HX OF   Coumadin was checked earlier this month and was at target goal between 2 and half and 3-1/2.      Encounter for monitoring Coumadin therapy   Need to monitor Coumadin every 2 weeks.  Last [name] was at goal.      Other Visit Diagnoses       Easy bruising       Relevant Orders   CBC with Differential/Platelet   PT and PTT  Hepatic function panel     Encounter for immunization       Relevant Orders   Pneumococcal conjugate vaccine 20-valent (Completed)      Bruising-will check additional labs including platelets PT PTT etc.  Return in about 4 months (around 10/04/2023).    Nani Gasser, MD

## 2023-05-27 NOTE — Assessment & Plan Note (Signed)
 Last serum creatinine 1.0 in February.

## 2023-05-27 NOTE — Assessment & Plan Note (Signed)
 Blood pressure not optimal today.  It looks fantastic 3 months ago so we will recheck before she goes home today to make sure still at goal.  She is currently on Tribenzor and metoprolol.

## 2023-05-27 NOTE — Assessment & Plan Note (Signed)
 Need to monitor Coumadin every 2 weeks.  Last [name] was at goal.

## 2023-05-27 NOTE — Assessment & Plan Note (Signed)
 Coumadin was checked earlier this month and was at target goal between 2 and half and 3-1/2.

## 2023-05-28 ENCOUNTER — Encounter: Payer: Self-pay | Admitting: Family Medicine

## 2023-05-28 LAB — CBC WITH DIFFERENTIAL/PLATELET
Basophils Absolute: 0.1 10*3/uL (ref 0.0–0.2)
Basos: 1 %
EOS (ABSOLUTE): 0.3 10*3/uL (ref 0.0–0.4)
Eos: 3 %
Hematocrit: 37.4 % (ref 34.0–46.6)
Hemoglobin: 12.7 g/dL (ref 11.1–15.9)
Immature Grans (Abs): 0.1 10*3/uL (ref 0.0–0.1)
Immature Granulocytes: 1 %
Lymphocytes Absolute: 1.1 10*3/uL (ref 0.7–3.1)
Lymphs: 10 %
MCH: 30.5 pg (ref 26.6–33.0)
MCHC: 34 g/dL (ref 31.5–35.7)
MCV: 90 fL (ref 79–97)
Monocytes Absolute: 0.7 10*3/uL (ref 0.1–0.9)
Monocytes: 7 %
Neutrophils Absolute: 8 10*3/uL — ABNORMAL HIGH (ref 1.4–7.0)
Neutrophils: 78 %
Platelets: 293 10*3/uL (ref 150–450)
RBC: 4.17 x10E6/uL (ref 3.77–5.28)
RDW: 14.5 % (ref 11.7–15.4)
WBC: 10.2 10*3/uL (ref 3.4–10.8)

## 2023-05-28 LAB — PT AND PTT
INR: 2.5 — ABNORMAL HIGH (ref 0.9–1.2)
Prothrombin Time: 26.4 s — ABNORMAL HIGH (ref 9.1–12.0)
aPTT: 40 s — ABNORMAL HIGH (ref 24–33)

## 2023-05-28 LAB — HEPATIC FUNCTION PANEL
ALT: 24 IU/L (ref 0–32)
AST: 27 IU/L (ref 0–40)
Albumin: 4.2 g/dL (ref 3.9–4.9)
Alkaline Phosphatase: 49 IU/L (ref 44–121)
Bilirubin Total: 0.4 mg/dL (ref 0.0–1.2)
Bilirubin, Direct: 0.17 mg/dL (ref 0.00–0.40)
Total Protein: 6.7 g/dL (ref 6.0–8.5)

## 2023-05-28 NOTE — Progress Notes (Signed)
 HI Erin Hill,  Platelets are in the normal range.  PT and PTT are okay.  Liver enzymes look great.

## 2023-05-29 ENCOUNTER — Ambulatory Visit
Admission: RE | Admit: 2023-05-29 | Discharge: 2023-05-29 | Disposition: A | Discharge status: Home / Self Care | Source: Ambulatory Visit | Attending: Family Medicine | Admitting: Family Medicine

## 2023-05-29 VITALS — BP 146/80 | HR 75 | Temp 98.2°F | Resp 16 | Ht 61.0 in | Wt 144.0 lb

## 2023-05-29 DIAGNOSIS — U071 COVID-19: Secondary | ICD-10-CM | POA: Diagnosis not present

## 2023-05-29 NOTE — Discharge Instructions (Signed)
Call for problems.

## 2023-05-29 NOTE — ED Triage Notes (Signed)
 Pt states that she has a sore throat, coughing, chills and body aches. X2-3 days  Pt states that she tested positive for covid.

## 2023-05-29 NOTE — ED Provider Notes (Signed)
 [facility]    CSN: [id number] Arrival date & time: 05/29/23  1111      History   Chief Complaint Chief Complaint  Patient presents with   Chills    Since Thursday with chills, body aches,low grade fever 100.5 friday morning. No appetite. Tested twice this morning and both test came out positive for covid. Test kits are over a year old. - Entered by patient    HPI Erin Hill is a 68 y.o. adult.   Patient states she started to feel sick on Thursday.  She had fever chills aches fatigue.  Rested on Friday.  This morning actually feels a little bit better except for sore throat.  She is COVID test at home is positive.  No coughing.  No shortness of breath. I explained to the patient that because of her medical illness and age she is considered high risk for COVID.  We discussed the prescription of Paxlovid.  Since she is already feeling improved from her illness and appears to have a mild case of COVID, together we decided Paxlovid is not indicated.    Past Medical History:  Diagnosis Date   Atrial flutter (HCC)    2015   Hearing loss    chronic   Miscarriage    X 2   Proteinuria    chronic (not related to DM)   Rheumatic heart disease    s/p aortic/ mitral valve replace    Patient Active Problem List   Diagnosis Date Noted   CKD (chronic kidney disease) stage 2, GFR 60-89 ml/min 01/29/2023   Encounter for monitoring Coumadin therapy 10/29/2022   Allergic reaction to food 09/09/2022   Multiple food allergies 09/09/2022   Chronic left shoulder pain 07/25/2021   Asymmetrical hearing loss 12/12/2020   Type 2 diabetes mellitus with other specified complication (HCC) 07/21/2017   Diastolic dysfunction 06/12/2015   Metacarpophalangeal joint pain of right hand 06/05/2013   PVC's (premature ventricular contractions) 06/05/2013   Pulmonary hypertension (HCC) 03/09/2013   Atrial flutter with rapid ventricular response (HCC) 02/23/2013   Trigger finger, acquired  08/02/2012   Mitral valve disease 10/13/2011   Allergy to IVP dye 08/27/2011   Long term (current) use of anticoagulants 08/27/2011   Normal coronary arteries 08/27/2011   Rheumatic heart disease 08/27/2011   MITRAL VALVE REPLACEMENT, HX OF 08/05/2006   Aortic valve replaced 08/05/2006   Hyperlipidemia 11/24/2005   Essential hypertension 11/24/2005    Past Surgical History:  Procedure Laterality Date   double heart valve replacement  1999   echo EF 75-80%     SAB     X 2   stapes surgery, left  2003   then removed 2 years later   TAB      X 1   TONSILLECTOMY  as a child    OB History   No obstetric history on file.      Home Medications    Prior to Admission medications   Medication Sig Start Date End Date Taking? Authorizing Provider  ACCU-CHEK GUIDE test strip CHECK FASTING BLOOD SUGAR EVERY MORNING. DX: E11.9 10/27/22  Yes Cydney Draft, MD  AMBULATORY NON FORMULARY MEDICATION Take 1 capsule by mouth daily. Medication Name: HERBAL FORMULARY TUMERIC,GARLIC,GINGER,ROSEMARY,CINNAMON   Yes [provider]  Blood Glucose Monitoring Suppl (ACCU-CHEK AVIVA PLUS) w/Device KIT Use to check blood sugar twice daily. Dx: R73.01 07/28/17  Yes Cydney Draft, MD  Coenzyme Q-10 100 MG capsule Take 300 mg by mouth  daily.   Yes [provider]  fexofenadine (ALLEGRA) 180 MG tablet Take 1 tablet (180 mg total) by mouth daily. 09/09/22  Yes Breeback, Jade L, PA-C  Lancets (ACCU-CHEK MULTICLIX) lancets Use to check blood sugar twice daily. Dx: R73.01 07/28/17  Yes Cydney Draft, MD  metFORMIN (GLUCOPHAGE-XR) 500 MG 24 hr tablet TAKE 1 TABLET BY MOUTH EVERY DAY WITH BREAKFAST 12/28/22  Yes Cydney Draft, MD  metoprolol succinate (TOPROL-XL) 100 MG 24 hr tablet TAKE 1 TABLET BY MOUTH DAILY WITH OR IMMEDIATELY FOLLOWING A MEAL. 02/05/23  Yes Cydney Draft, MD  Olmesartan-amLODIPine-HCTZ 40-5-25 MG TABS Take 1 tablet by mouth daily. 04/10/23   Yes [provider]  OVER THE COUNTER MEDICATION Super C ( vit D, Vit D, zinc)   Yes [provider]  rosuvastatin (CRESTOR) 10 MG tablet Take 1 tablet (10 mg total) by mouth daily. 03/01/23  Yes Cydney Draft, MD  sitaGLIPtin (JANUVIA) 100 MG tablet Take 1 tablet (100 mg total) by mouth daily. 03/05/23  Yes Cydney Draft, MD  Vitamin K, Phytonadione, 100 MCG TABS Take 100 mcg daily by mouth.   Yes [provider]  warfarin (COUMADIN) 5 MG tablet TAKE 1 TABLET BY MOUTH EVERY MONDAY,WEDNESDAY, AND FRIDAY. TAKE 1/2 TABLET ALL OTHER DAYS AS DIRECTED BY COUMADIN CLINIC. 05/05/23  Yes Cydney Draft, MD    Family History Family History  Problem Relation Age of Onset   Diabetes Father    Lung cancer Mother        smoked until her 53's   Hypertension Mother    Diabetes Father    Hyperlipidemia Father    Hypertension Father     Social History Social History   Tobacco Use   Smoking status: Never   Smokeless tobacco: Never  Vaping Use   Vaping status: Never Used  Substance Use Topics   Alcohol use: Never   Drug use: Never     Allergies   Erythromycin, Gemfibrozil, Shellfish allergy, Shellfish-derived products, Strawberry extract, Versed [midazolam], Farxiga [dapagliflozin], Iodinated contrast media, Lipitor [atorvastatin], and Protonix [pantoprazole sodium]   Review of Systems Review of Systems See HPI  Physical Exam Triage Vital Signs ED Triage Vitals  Encounter Vitals Group     BP 05/29/23 1135 (!) 146/80     Systolic BP Percentile --      Diastolic BP Percentile --      Pulse Rate 05/29/23 1135 75     Resp 05/29/23 1135 16     Temp 05/29/23 1135 98.2 F (36.8 C)     Temp Source 05/29/23 1135 Oral     SpO2 05/29/23 1135 97 %     Weight 05/29/23 1134 144 lb (65.3 kg)     Height 05/29/23 1134 5\' 1"  (1.549 m)     Head Circumference --      Peak Flow --      Pain Score 05/29/23 1134 3     Pain Loc --      Pain Education  --      Exclude from Growth Chart --    No data found.  Updated Vital Signs BP (!) 146/80 (BP Location: Right Arm)   Pulse 75   Temp 98.2 F (36.8 C) (Oral)   Resp 16   Ht 5\' 1"  (1.549 m)   Wt 65.3 kg   SpO2 97%   BMI 27.21 kg/m       Physical Exam Constitutional:      General: She  is not in acute distress.    Appearance: Normal appearance. She is well-developed. She is not ill-appearing.  HENT:     Head: Normocephalic and atraumatic.  Eyes:     Conjunctiva/sclera: Conjunctivae normal.     Pupils: Pupils are equal, round, and reactive to light.  Cardiovascular:     Rate and Rhythm: Normal rate and regular rhythm.     Heart sounds: Murmur heard.     Comments: Pronounced murmur and click (artificial valve) Pulmonary:     Effort: Pulmonary effort is normal. No respiratory distress.     Breath sounds: Normal breath sounds.  Abdominal:     General: There is no distension.     Palpations: Abdomen is soft.  Musculoskeletal:        General: Normal range of motion.     Cervical back: Normal range of motion.  Skin:    General: Skin is warm and dry.  Neurological:     Mental Status: She is alert.      UC Treatments / Results  Labs (all labs ordered are listed, but only abnormal results are displayed) Labs Reviewed - No data to display  EKG   Radiology No results found.  Procedures Procedures (including critical care time)  Medications Ordered in UC Medications - No data to display  Initial Impression / Assessment and Plan / UC Course  I have reviewed the triage vital signs and the nursing notes.  Pertinent labs & imaging results that were available during my care of the patient were reviewed by me and considered in my medical decision making (see chart for details).    Mask wearing for 10 days after symptom onset is recommended COVID-19 information is given Final Clinical Impressions(s) / UC Diagnoses   Final diagnoses:  COVID-19     Discharge  Instructions      Call for problems    ED Prescriptions   None    PDMP not reviewed this encounter.   Stephany Ehrich, MD 05/29/23 [phone]

## 2023-06-08 ENCOUNTER — Encounter: Payer: Self-pay | Admitting: Family Medicine

## 2023-06-08 LAB — POCT INR

## 2023-06-25 NOTE — Progress Notes (Signed)
 Patient: [name] is too high please see if there have been any changes recently.  His verify had taking Coumadin I will have to decrease her dose slightly.

## 2023-07-02 ENCOUNTER — Other Ambulatory Visit: Payer: Self-pay | Admitting: Family Medicine

## 2023-07-02 DIAGNOSIS — I10 Essential (primary) hypertension: Secondary | ICD-10-CM

## 2023-07-02 NOTE — Telephone Encounter (Signed)
Rx written by historical provider.

## 2023-07-06 ENCOUNTER — Ambulatory Visit: Payer: Self-pay | Admitting: Family Medicine

## 2023-07-06 NOTE — Progress Notes (Signed)
 [name] at goal.

## 2023-07-12 ENCOUNTER — Other Ambulatory Visit: Payer: Self-pay | Admitting: Family Medicine

## 2023-07-12 DIAGNOSIS — E1169 Type 2 diabetes mellitus with other specified complication: Secondary | ICD-10-CM

## 2023-07-13 ENCOUNTER — Other Ambulatory Visit: Payer: Self-pay | Admitting: Family Medicine

## 2023-07-13 DIAGNOSIS — E1169 Type 2 diabetes mellitus with other specified complication: Secondary | ICD-10-CM

## 2023-07-13 MED ORDER — METFORMIN HCL ER 500 MG PO TB24: 500.0000 mg | ORAL_TABLET | Freq: Two times a day (BID) | ORAL | 1 refills | Status: AC

## 2023-07-20 ENCOUNTER — Telehealth: Payer: Self-pay | Admitting: Family Medicine

## 2023-07-20 LAB — POCT INR

## 2023-07-20 NOTE — Telephone Encounter (Signed)
 Please schedule pt for Nurse visit for [name] check thanks

## 2023-07-20 NOTE — Telephone Encounter (Signed)
 Copied from CRM [id number]. Topic: Appointments - Appointment Scheduling >> Jul 20, 2023  7:54 AM [facility] H wrote: Patient is wanting to come in for labs to test her [name] checked, please call patient back at [phone]

## 2023-07-20 NOTE — Telephone Encounter (Signed)
 Called spoke with patient she is scheduled for a nurse's visit [name]

## 2023-07-21 ENCOUNTER — Ambulatory Visit: Payer: Self-pay | Admitting: Family Medicine

## 2023-07-21 ENCOUNTER — Ambulatory Visit

## 2023-07-21 NOTE — Progress Notes (Signed)
[name] within range

## 2023-08-02 ENCOUNTER — Other Ambulatory Visit: Payer: Self-pay | Admitting: Family Medicine

## 2023-08-04 ENCOUNTER — Encounter: Payer: Self-pay | Admitting: Family Medicine

## 2023-08-17 ENCOUNTER — Ambulatory Visit: Payer: Self-pay | Admitting: Family Medicine

## 2023-08-17 LAB — PROTIME-INR: INR: 2.9 — AB (ref 0.80–1.20)

## 2023-08-17 NOTE — Progress Notes (Signed)
[name] at goal

## 2023-08-19 ENCOUNTER — Other Ambulatory Visit: Payer: Self-pay | Admitting: Family Medicine

## 2023-08-23 LAB — HM DIABETES EYE EXAM

## 2023-08-31 ENCOUNTER — Encounter: Payer: Self-pay | Admitting: Family Medicine

## 2023-08-31 LAB — POCT INR

## 2023-09-03 ENCOUNTER — Ambulatory Visit: Payer: Self-pay | Admitting: Family Medicine

## 2023-09-03 NOTE — Progress Notes (Signed)
 Call pt: [name] a little high at 3.7.  she can ship one days dose and then resume and recheck on her regular schedule.

## 2023-09-25 ENCOUNTER — Other Ambulatory Visit: Payer: Self-pay | Admitting: Physician Assistant

## 2023-09-25 DIAGNOSIS — Z91018 Allergy to other foods: Secondary | ICD-10-CM

## 2023-09-25 DIAGNOSIS — T781XXS Other adverse food reactions, not elsewhere classified, sequela: Secondary | ICD-10-CM

## 2023-09-25 DIAGNOSIS — R21 Rash and other nonspecific skin eruption: Secondary | ICD-10-CM

## 2023-09-27 ENCOUNTER — Encounter: Payer: Self-pay | Admitting: Family Medicine

## 2023-09-27 ENCOUNTER — Ambulatory Visit: Admitting: Family Medicine

## 2023-09-27 VITALS — BP 130/76 | HR 65 | Ht 62.0 in | Wt 148.0 lb

## 2023-09-27 DIAGNOSIS — N182 Chronic kidney disease, stage 2 (mild): Secondary | ICD-10-CM | POA: Diagnosis not present

## 2023-09-27 DIAGNOSIS — E1169 Type 2 diabetes mellitus with other specified complication: Secondary | ICD-10-CM

## 2023-09-27 DIAGNOSIS — Z7984 Long term (current) use of oral hypoglycemic drugs: Secondary | ICD-10-CM

## 2023-09-27 DIAGNOSIS — I10 Essential (primary) hypertension: Secondary | ICD-10-CM | POA: Diagnosis not present

## 2023-09-27 DIAGNOSIS — L609 Nail disorder, unspecified: Secondary | ICD-10-CM

## 2023-09-27 DIAGNOSIS — E1122 Type 2 diabetes mellitus with diabetic chronic kidney disease: Secondary | ICD-10-CM

## 2023-09-27 LAB — POCT GLYCOSYLATED HEMOGLOBIN (HGB A1C): Hemoglobin A1C: 5.8 % — AB (ref 4.0–5.6)

## 2023-09-27 NOTE — Assessment & Plan Note (Signed)
 Due to recheck renal function as well as urine microalbumin.

## 2023-09-27 NOTE — Assessment & Plan Note (Signed)
 A1c looks fantastic today 5.6.  Continue current regimen with metformin  and Jardiance and working on healthy diet and regular exercise.

## 2023-09-27 NOTE — Progress Notes (Addendum)
 Established Patient Office Visit  Subjective  Patient ID: Erin Hill, female    DOB: 06/01/1955  Age: 68 y.o. MRN: [id number]  Chief Complaint  Patient presents with   Hypertension    HPI Diabetes - no hypoglycemic events. No wounds or sores that are not healing well. No increased thirst or urination. Checking glucose at home. Taking medications as prescribed without any side effects.  Hypertension- Pt denies chest pain, SOB, dizziness, or heart palpitations.  Taking meds as directed w/o problems.  Denies medication side effects.  She has an upcoming appointment with cardiology.  She follows at [facility] with Dr. Alease  She also wanted me to look at her fourth toe on her foot she remembers hitting it on something.  At the time she just kept going but then afterwards realized that the nail was bleeding and it was a little bit loose she has been trying to wear a Band-Aid on it but would like to just have it removed at this point.    ROS    Objective:     BP 130/76   Pulse 65   Ht 5' 2 (1.575 m)   Wt 148 lb (67.1 kg)   SpO2 98%   BMI 27.07 kg/m    Physical Exam Vitals and nursing note reviewed.  Constitutional:      Appearance: Normal appearance.  HENT:     Head: Normocephalic and atraumatic.  Eyes:     Conjunctiva/sclera: Conjunctivae normal.  Cardiovascular:     Rate and Rhythm: Normal rate and regular rhythm.     Heart sounds: Murmur heard.  Pulmonary:     Effort: Pulmonary effort is normal.     Breath sounds: Normal breath sounds.  Skin:    General: Skin is warm and dry.  Neurological:     Mental Status: She is alert.  Psychiatric:        Mood and Affect: Mood normal.      Results for orders placed or performed in visit on 09/27/23  Lipid panel  Result Value Ref Range   Cholesterol, Total 154 100 - 199 mg/dL   Triglycerides 837 (H) 0 - 149 mg/dL   HDL 41 >60 mg/dL   VLDL Cholesterol Cal 28 5 - 40 mg/dL   LDL Chol Calc (NIH) 85 0 - 99 mg/dL    Chol/HDL Ratio 3.8 0.0 - 4.4 ratio  CMP14+EGFR  Result Value Ref Range   Glucose 127 (H) 70 - 99 mg/dL   BUN 24 8 - 27 mg/dL   Creatinine, Ser 8.84 (H) 0.57 - 1.00 mg/dL   eGFR 52 (L) >40 fO/fpw/8.26   BUN/Creatinine Ratio 21 12 - 28   Sodium 139 134 - 144 mmol/L   Potassium 4.4 3.5 - 5.2 mmol/L   Chloride 101 96 - 106 mmol/L   CO2 23 20 - 29 mmol/L   Calcium  9.4 8.7 - 10.3 mg/dL   Total Protein 6.4 6.0 - 8.5 g/dL   Albumin 3.9 3.9 - 4.9 g/dL   Globulin, Total 2.5 1.5 - 4.5 g/dL   Bilirubin Total 0.3 0.0 - 1.2 mg/dL   Alkaline Phosphatase 45 44 - 121 IU/L   AST 28 0 - 40 IU/L   ALT 30 0 - 32 IU/L  Urine Microalbumin w/creat. ratio  Result Value Ref Range   Creatinine, Urine 128.2 Not Estab. mg/dL   Microalbumin, Urine 7.8 Not Estab. ug/mL   Microalb/Creat Ratio 6 0 - 29 mg/g creat  Specimen status report  Result Value Ref Range   specimen status report Comment   POCT HgB A1C  Result Value Ref Range   Hemoglobin A1C 5.8 (A) 4.0 - 5.6 %   HbA1c POC (<> result, manual entry)     HbA1c, POC (prediabetic range)     HbA1c, POC (controlled diabetic range)        The ASCVD Risk score (Arnett DK, et al., 2019) failed to calculate for the following reasons:   Risk score cannot be calculated because patient has a medical history suggesting prior/existing ASCVD    Assessment & Plan:   Problem List Items Addressed This Visit       Cardiovascular and Mediastinum   Essential hypertension   Repeat blood pressure looks much better.  Just a little borderline we will keep an eye on it continue current regimen.      Relevant Orders   Lipid panel (Completed)   CMP14+EGFR (Completed)   Urine Microalbumin w/creat. ratio (Completed)     Endocrine   Controlled type 2 diabetes mellitus with kidney complication, without long-term current use of insulin (HCC) - Primary   A1c looks fantastic today 5.6.  Continue current regimen with metformin  and Jardiance and working on healthy diet  and regular exercise.      Relevant Orders   POCT HgB A1C (Completed)   Lipid panel (Completed)   CMP14+EGFR (Completed)   Urine Microalbumin w/creat. ratio (Completed)     Genitourinary   CKD (chronic kidney disease) stage 2, GFR 60-89 ml/min   Due to recheck renal function as well as urine microalbumin.      Relevant Orders   Lipid panel (Completed)   CMP14+EGFR (Completed)   Urine Microalbumin w/creat. ratio (Completed)   Other Visit Diagnoses       Loose toenail          Loose toenail-we did discuss that she could always just wait for it to fall off or since it is almost attached I am happy to remove it today so she is not having to keep a Band-Aid on it.   I did use forceps to grasp the nail and quickly removed it.  She did not have any discomfort or pain.  No bleeding afterwards.  She tolerated it well.  Return in about 6 months (around 03/29/2024) for Diabetes follow-up, Hypertension.    Dorothyann Byars, MD

## 2023-09-27 NOTE — Assessment & Plan Note (Signed)
 Repeat blood pressure looks much better.  Just a little borderline we will keep an eye on it continue current regimen.

## 2023-09-28 ENCOUNTER — Ambulatory Visit: Payer: Self-pay | Admitting: Family Medicine

## 2023-09-28 DIAGNOSIS — R7989 Other specified abnormal findings of blood chemistry: Secondary | ICD-10-CM

## 2023-09-28 LAB — CMP14+EGFR
ALT: 30 IU/L (ref 0–32)
AST: 28 IU/L (ref 0–40)
Albumin: 3.9 g/dL (ref 3.9–4.9)
Alkaline Phosphatase: 45 IU/L (ref 44–121)
BUN/Creatinine Ratio: 21 (ref 12–28)
BUN: 24 mg/dL (ref 8–27)
Bilirubin Total: 0.3 mg/dL (ref 0.0–1.2)
CO2: 23 mmol/L (ref 20–29)
Calcium: 9.4 mg/dL (ref 8.7–10.3)
Chloride: 101 mmol/L (ref 96–106)
Creatinine, Ser: 1.15 mg/dL — ABNORMAL HIGH (ref 0.57–1.00)
Globulin, Total: 2.5 g/dL (ref 1.5–4.5)
Glucose: 127 mg/dL — ABNORMAL HIGH (ref 70–99)
Potassium: 4.4 mmol/L (ref 3.5–5.2)
Sodium: 139 mmol/L (ref 134–144)
Total Protein: 6.4 g/dL (ref 6.0–8.5)
eGFR: 52 mL/min/1.73 — ABNORMAL LOW (ref >59)

## 2023-09-28 LAB — LIPID PANEL
Chol/HDL Ratio: 3.8 ratio (ref 0.0–4.4)
Cholesterol, Total: 154 mg/dL (ref 100–199)
HDL: 41 mg/dL (ref >39)
LDL Chol Calc (NIH): 85 mg/dL (ref 0–99)
Triglycerides: 162 mg/dL — ABNORMAL HIGH (ref 0–149)
VLDL Cholesterol Cal: 28 mg/dL (ref 5–40)

## 2023-09-28 NOTE — Progress Notes (Signed)
 LDL looks much better!  Kidney function is up just a little bit it looks like you have been running around 1.0 for the last probably couple years and this time it was 1.1.  So I do want to maybe recheck that again in a month.  Just to make sure it is not trending 1 where the other.  It may just be how hydrated you were when you had the labs drawn.  Liver enzymes are normal.

## 2023-09-29 ENCOUNTER — Other Ambulatory Visit: Payer: Self-pay | Admitting: *Deleted

## 2023-09-29 DIAGNOSIS — Z91018 Allergy to other foods: Secondary | ICD-10-CM

## 2023-09-29 DIAGNOSIS — T781XXS Other adverse food reactions, not elsewhere classified, sequela: Secondary | ICD-10-CM

## 2023-09-29 DIAGNOSIS — R21 Rash and other nonspecific skin eruption: Secondary | ICD-10-CM

## 2023-09-29 MED ORDER — FEXOFENADINE HCL 180 MG PO TABS: 180.0000 mg | ORAL_TABLET | Freq: Every day | ORAL | 3 refills | Status: AC

## 2023-10-05 ENCOUNTER — Encounter: Payer: Self-pay | Admitting: Family Medicine

## 2023-10-05 LAB — SPECIMEN STATUS REPORT

## 2023-10-05 LAB — MICROALBUMIN / CREATININE URINE RATIO
Creatinine, Urine: 128.2 mg/dL
Microalb/Creat Ratio: 6 mg/g{creat} (ref 0–29)
Microalbumin, Urine: 7.8 ug/mL

## 2023-10-06 ENCOUNTER — Ambulatory Visit: Payer: Self-pay | Admitting: Family Medicine

## 2023-10-06 NOTE — Progress Notes (Signed)
 [name] too low. Verify dosing so we can adjust

## 2023-10-06 NOTE — Progress Notes (Signed)
 Your lab work is within acceptable range and there are no concerning findings.   ?

## 2023-10-07 ENCOUNTER — Other Ambulatory Visit: Payer: Self-pay | Admitting: *Deleted

## 2023-10-07 DIAGNOSIS — R7989 Other specified abnormal findings of blood chemistry: Secondary | ICD-10-CM

## 2023-10-11 ENCOUNTER — Encounter: Payer: Self-pay | Admitting: Family Medicine

## 2023-10-15 NOTE — Addendum Note (Signed)
 Addended by: Arsenia Goracke D on: 10/15/2023 01:08 PM   Modules accepted: Level of Service

## 2023-10-19 ENCOUNTER — Encounter: Payer: Self-pay | Admitting: Sports Medicine

## 2023-10-19 ENCOUNTER — Ambulatory Visit: Payer: Self-pay | Admitting: Family Medicine

## 2023-10-19 DIAGNOSIS — Z9889 Other specified postprocedural states: Secondary | ICD-10-CM

## 2023-10-19 DIAGNOSIS — Z7901 Long term (current) use of anticoagulants: Secondary | ICD-10-CM

## 2023-10-19 DIAGNOSIS — Z952 Presence of prosthetic heart valve: Secondary | ICD-10-CM

## 2023-10-19 LAB — POCT INR: INR: 2.3 (ref 2.0–3.0)

## 2023-10-19 NOTE — Progress Notes (Signed)
 Take 5 mg every day. Borderline low. Recheck in 2 weeks.

## 2023-10-19 NOTE — Telephone Encounter (Signed)
 Take 5 mg every day. Borderline low. Recheck in 2 weeks.

## 2023-10-20 ENCOUNTER — Encounter: Payer: Self-pay | Admitting: Family Medicine

## 2023-10-20 NOTE — Progress Notes (Signed)
 Left a brief voicemail for the patient to return a call back to clinic regarding a change in their warfarin therapy. Direct call back information was provided.

## 2023-10-22 ENCOUNTER — Encounter: Payer: Self-pay | Admitting: Family Medicine

## 2023-10-22 DIAGNOSIS — E119 Type 2 diabetes mellitus without complications: Secondary | ICD-10-CM

## 2023-10-22 MED ORDER — SITAGLIPTIN PHOSPHATE 25 MG PO TABS
25.0000 mg | ORAL_TABLET | Freq: Every day | ORAL | 1 refills | Status: DC
Start: 2023-10-22 — End: 2023-10-27

## 2023-10-22 MED ORDER — ACCU-CHEK GUIDE TEST VI STRP: ORAL_STRIP | 12 refills | Status: DC

## 2023-10-22 MED ORDER — ACCU-CHEK GUIDE TEST VI STRP: ORAL_STRIP | 12 refills | Status: AC

## 2023-10-27 MED ORDER — SITAGLIPTIN PHOSPHATE 25 MG PO TABS: 25.0000 mg | ORAL_TABLET | Freq: Every day | ORAL | 3 refills | Status: DC

## 2023-10-27 NOTE — Addendum Note (Signed)
 Addended by: Jaid Quirion P on: 10/27/2023 09:09 AM   Modules accepted: Orders

## 2023-10-27 NOTE — Telephone Encounter (Signed)
 Pended prescription O.k. to print and fax to patient assistance as written ?

## 2023-10-28 ENCOUNTER — Other Ambulatory Visit: Payer: Self-pay | Admitting: Family Medicine

## 2023-10-29 ENCOUNTER — Encounter: Payer: Self-pay | Admitting: Family Medicine

## 2023-10-29 NOTE — Telephone Encounter (Signed)
 Tonya,  Do you know if the Januvia  prescription was faxed to patient assistance already or do I need to print and send this one?

## 2023-10-31 ENCOUNTER — Other Ambulatory Visit: Payer: Self-pay | Admitting: Family Medicine

## 2023-10-31 DIAGNOSIS — I10 Essential (primary) hypertension: Secondary | ICD-10-CM

## 2023-11-03 ENCOUNTER — Ambulatory Visit: Payer: Self-pay

## 2023-11-03 NOTE — Telephone Encounter (Signed)
 FYI Only or Action Required?: FYI only for provider.  Patient was last seen in primary care on 09/27/2023 by Alvan Dorothyann BIRCH, MD.  Called Nurse Triage reporting Sinusitis.  Symptoms began several days ago.  Symptoms are: gradually worsening.  Triage Disposition: See PCP When Office is Open (Within 3 Days)  Patient/caregiver understands and will follow disposition?: Yes       Copied from CRM [id number]. Topic: Clinical - Red Word Triage >> Nov 03, 2023  2:36 PM [name] wrote: Red Word that prompted transfer to Nurse Triage: Sinus infection Symptoms Headaches/sniffling/Snezzing/Bodyaches for 3 days but very uncomfortable      Reason for Disposition  [1] Using nasal washes and pain medicine > 24 hours AND [2] sinus pain (around cheekbone or eye) persists  Answer Assessment - Initial Assessment Questions 1. LOCATION: Where does it hurt?      Around eyes and forehead  2. ONSET: When did the sinus pain start?  (e.g., hours, days)      3 days ago 3. SEVERITY: How bad is the pain?   (Scale 0-10; or none, mild, moderate or severe)     6/10 4. RECURRENT SYMPTOM: Have you ever had sinus problems before? If Yes, ask: When was the last time? and What happened that time?      Yes, history of sinus infections  5. NASAL CONGESTION: Is the nose blocked? If Yes, ask: Can you open it or must you breathe through your mouth?     Yes 6. NASAL DISCHARGE: Do you have discharge from your nose? If so ask, What color?     Clear  7. FEVER: Do you have a fever? If Yes, ask: What is it, how was it measured, and when did it start?      No 8. OTHER SYMPTOMS: Do you have any other symptoms? (e.g., sore throat, cough, earache, difficulty breathing)     Body aches, intermittent burning sensation in eyes and nose  Protocols used: Sinus Pain or Congestion-A-AH

## 2023-11-04 ENCOUNTER — Encounter: Payer: Self-pay | Admitting: Family Medicine

## 2023-11-04 ENCOUNTER — Ambulatory Visit (INDEPENDENT_AMBULATORY_CARE_PROVIDER_SITE_OTHER): Admitting: Family Medicine

## 2023-11-04 VITALS — BP 100/61 | HR 77 | Temp 98.2°F | Ht 62.0 in | Wt 148.0 lb

## 2023-11-04 DIAGNOSIS — E1159 Type 2 diabetes mellitus with other circulatory complications: Secondary | ICD-10-CM | POA: Diagnosis not present

## 2023-11-04 DIAGNOSIS — Z7984 Long term (current) use of oral hypoglycemic drugs: Secondary | ICD-10-CM | POA: Diagnosis not present

## 2023-11-04 DIAGNOSIS — I152 Hypertension secondary to endocrine disorders: Secondary | ICD-10-CM | POA: Diagnosis not present

## 2023-11-04 DIAGNOSIS — J069 Acute upper respiratory infection, unspecified: Secondary | ICD-10-CM

## 2023-11-04 NOTE — Progress Notes (Signed)
   Acute Office Visit  Subjective:     Patient ID: Erin Hill, female    DOB: 1955-10-07, 68 y.o.   MRN: [id number]  Chief Complaint  Patient presents with   Nasal Congestion    X3 days with sneezing and body aches off and on; negative COVID test; received Flu shot Friday (10/29/23)    HPI Patient is in today for bodyaches, sneezing, nasal stuffiness that started 3 days after.  Had a fever to 100 last night but feels better today. Neg home COVID test.  No cough.    ROS      Objective:    BP 100/61   Pulse 77   Temp 98.2 F (36.8 C) (Oral)   Ht 5' 2 (1.575 m)   Wt 148 lb (67.1 kg)   SpO2 98%   BMI 27.07 kg/m    Physical Exam Constitutional:      Appearance: Normal appearance.  HENT:     Head: Normocephalic and atraumatic.     Right Ear: Tympanic membrane, ear canal and external ear normal. There is no impacted cerumen.     Left Ear: Tympanic membrane, ear canal and external ear normal. There is no impacted cerumen.     Nose: Nose normal.     Mouth/Throat:     Pharynx: Oropharynx is clear.  Eyes:     Conjunctiva/sclera: Conjunctivae normal.  Cardiovascular:     Rate and Rhythm: Normal rate and regular rhythm.  Pulmonary:     Effort: Pulmonary effort is normal.     Breath sounds: Normal breath sounds.  Musculoskeletal:     Cervical back: Neck supple. No tenderness.  Lymphadenopathy:     Cervical: No cervical adenopathy.  Skin:    General: Skin is warm and dry.  Neurological:     Mental Status: She is alert and oriented to person, place, and time.  Psychiatric:        Mood and Affect: Mood normal.     No results found for any visits on 11/04/23.      Assessment & Plan:   Problem List Items Addressed This Visit       Cardiovascular and Mediastinum   Hypertension associated with diabetes (HCC)   Blood pressure was a little on the lower end today but when she was here in August it was actually on the higher end so did not make any changes to her  regimen today we will just continue to monitor.      Other Visit Diagnoses       Viral upper respiratory tract infection    -  Primary      Recommend symptomatic care.  If not feeling better in the next few days then please let us  know.  She actually is feeling a little better today and almost canceled the appointment.  If she feels like she is suddenly getting worse or develops another fever then please let us  know  No orders of the defined types were placed in this encounter.   No follow-ups on file.  Dorothyann Byars, MD

## 2023-11-04 NOTE — Assessment & Plan Note (Signed)
 Blood pressure was a little on the lower end today but when she was here in August it was actually on the higher end so did not make any changes to her regimen today we will just continue to monitor.

## 2023-11-08 ENCOUNTER — Ambulatory Visit
Admission: RE | Admit: 2023-11-08 | Discharge: 2023-11-08 | Disposition: A | Discharge status: Home / Self Care | Source: Ambulatory Visit | Attending: Family Medicine | Admitting: Family Medicine

## 2023-11-08 VITALS — BP 143/85 | HR 78 | Temp 98.0°F

## 2023-11-08 DIAGNOSIS — H00022 Hordeolum internum right lower eyelid: Secondary | ICD-10-CM

## 2023-11-08 MED ORDER — DOXYCYCLINE HYCLATE 100 MG PO CAPS: 100.0000 mg | ORAL_CAPSULE | Freq: Two times a day (BID) | ORAL | 0 refills | Status: DC

## 2023-11-08 NOTE — ED Triage Notes (Signed)
 Pt states since Friday her right eyelid has been swollen and painful. She has been using warm compress and tried some leftover abx eye drops. Denies improvement, no vision changes. States pain in only around eyelid not eyeball.

## 2023-11-08 NOTE — Discharge Instructions (Signed)
 Take doxycycline  2 times a day.  Take this antibiotic with food Continue eyedrops prescribed by your eye doctor See your eye doctor if you fail to improve by the end of the week

## 2023-11-08 NOTE — ED Provider Notes (Signed)
 [facility]    CSN: [id number] Arrival date & time: 11/08/23  1046      History   Chief Complaint Chief Complaint  Patient presents with   Eye Problem    Possible stye or eye infection. Red, swollen and painful. - Entered by patient    HPI Erin Hill is a 68 y.o. female.   HPI  Patient states that she has had an eye infection similarly in the past.  She states that it was in her left eye.  She states that she was seen by her eye doctor who prescribed oral antibiotics and eyedrops and it cleared up.  She states that this time she has had some swelling in pain in her right eye.  Lower lid.  Some soft tissue swelling in the skin and redness of the eye.  No discharge.  No visual defect.  She is using the eyedrops but states it is not helping.  Past Medical History:  Diagnosis Date   Allergy    Atrial flutter (HCC)    2015   GERD (gastroesophageal reflux disease)    Hearing loss    chronic   Heart murmur    Hyperlipidemia    Hypertension    Miscarriage    X 2   Proteinuria    chronic (not related to DM)   Rheumatic heart disease    s/p aortic/ mitral valve replace    Patient Active Problem List   Diagnosis Date Noted   CKD (chronic kidney disease) stage 2, GFR 60-89 ml/min 01/29/2023   Encounter for monitoring Coumadin therapy 10/29/2022   Allergic reaction to food 09/09/2022   Multiple food allergies 09/09/2022   Chronic left shoulder pain 07/25/2021   Asymmetrical hearing loss 12/12/2020   Controlled type 2 diabetes mellitus with kidney complication, without long-term current use of insulin (HCC) 07/21/2017   Diastolic dysfunction 06/12/2015   Metacarpophalangeal joint pain of right hand 06/05/2013   PVC's (premature ventricular contractions) 06/05/2013   Pulmonary hypertension (HCC) 03/09/2013   Atrial flutter with rapid ventricular response (HCC) 02/23/2013   Trigger finger, acquired 08/02/2012   Mitral valve disease 10/13/2011   Allergy to IVP  dye 08/27/2011   Long term (current) use of anticoagulants 08/27/2011   Normal coronary arteries 08/27/2011   Rheumatic heart disease 08/27/2011   MITRAL VALVE REPLACEMENT, HX OF 08/05/2006   Aortic valve replaced 08/05/2006   Hyperlipidemia 11/24/2005   Hypertension associated with diabetes (HCC) 11/24/2005    Past Surgical History:  Procedure Laterality Date   CARDIAC VALVE REPLACEMENT  03/06/97   Mitral/Aortic mechanical valves   double heart valve replacement  02/16/1997   echo EF 75-80%     SAB     X 2   stapes surgery, left  02/16/2001   then removed 2 years later   TAB      X 1   TONSILLECTOMY  as a child    OB History   No obstetric history on file.      Home Medications    Prior to Admission medications   Medication Sig Start Date End Date Taking? Authorizing Provider  doxycycline  (VIBRAMYCIN ) 100 MG capsule Take 1 capsule (100 mg total) by mouth 2 (two) times daily. 11/08/23  Yes Maranda Jamee Jacob, MD  AMBULATORY NON FORMULARY MEDICATION Take 1 capsule by mouth daily. Medication Name: [facility]    [provider]  Coenzyme Q-10 100 MG capsule Take 300 mg by mouth daily.    [provider]  fexofenadine  (ALLEGRA ) 180 MG tablet Take 1 tablet (180 mg total) by mouth daily. 09/29/23   Alvan Dorothyann BIRCH, MD  glucose blood (ACCU-CHEK GUIDE TEST) test strip Use as instructed. Check fasting blood sugar every morning. DX: E11.9 10/22/23   Alvan Dorothyann BIRCH, MD  Lancets (ACCU-CHEK MULTICLIX) lancets Use to check blood sugar twice daily. Dx: R73.01 07/28/17   Alvan Dorothyann BIRCH, MD  metFORMIN  (GLUCOPHAGE -XR) 500 MG 24 hr tablet Take 1 tablet (500 mg total) by mouth 2 (two) times daily with a meal. 07/13/23   Alvan Dorothyann BIRCH, MD  metoprolol  succinate (TOPROL -XL) 100 MG 24 hr tablet TAKE 1 TABLET BY MOUTH DAILY WITH OR IMMEDIATELY FOLLOWING A MEAL. 08/03/23   Alvan Dorothyann BIRCH, MD   Olmesartan -amLODIPine -HCTZ 40-5-25 MG TABS TAKE 1 TABLET BY MOUTH EVERY DAY 11/01/23   Metheney, Catherine D, MD  OVER THE COUNTER MEDICATION Super C ( vit D, Vit D, zinc)    [provider]  rosuvastatin  (CRESTOR ) 10 MG tablet TAKE 1 TABLET BY MOUTH EVERY DAY 08/22/23   Alvan Dorothyann BIRCH, MD  sitaGLIPtin  (JANUVIA ) 25 MG tablet Take 1 tablet (25 mg total) by mouth daily. Fax to [phone] 10/27/23   Alvan Dorothyann BIRCH, MD  Vitamin K, Phytonadione, 100 MCG TABS Take 100 mcg daily by mouth.    [provider]  warfarin (COUMADIN) 5 MG tablet TAKE 1 TABLET BY MOUTH EVERY MONDAY,WEDNESDAY, AND FRIDAY. TAKE 1/2 TABLET ALL OTHER DAYS AS DIRECTED BY COUMADIN CLINIC. 10/28/23   Alvan Dorothyann BIRCH, MD    Family History Family History  Problem Relation Age of Onset   Diabetes Father    Hyperlipidemia Father    Hypertension Father    Hearing loss Father    Lung cancer Mother        smoked until her 40's   Hypertension Mother    Arthritis Mother    Cancer Mother    Diabetes Mother    Diabetes Sister     Social History Social History   Tobacco Use   Smoking status: Never   Smokeless tobacco: Never  Vaping Use   Vaping status: Never Used  Substance Use Topics   Alcohol use: Never   Drug use: Never     Allergies   Erythromycin, Gemfibrozil, Shellfish allergy, Shellfish-derived products, Strawberry extract, Versed [midazolam], Farxiga  [dapagliflozin ], Iodinated contrast media, Lipitor [atorvastatin ], and Protonix [pantoprazole sodium]   Review of Systems Review of Systems See HPI  Physical Exam Triage Vital Signs ED Triage Vitals  Encounter Vitals Group     BP 11/08/23 1107 (!) 143/85     Girls Systolic BP Percentile --      Girls Diastolic BP Percentile --      Boys Systolic BP Percentile --      Boys Diastolic BP Percentile --      Pulse Rate 11/08/23 1107 78     Resp --      Temp 11/08/23 1107 98 F (36.7 C)     Temp src --      SpO2  11/08/23 1107 97 %     Weight --      Height --      Head Circumference --      Peak Flow --      Pain Score 11/08/23 1108 5     Pain Loc --      Pain Education --      Exclude from Growth Chart --    No data found.  Updated Vital Signs BP (!) 143/85 (BP Location: Right Arm)   Pulse 78   Temp 98 F (36.7 C)   SpO2 97%        Physical Exam Constitutional:      General: She is not in acute distress.    Appearance: She is well-developed.  HENT:     Head: Normocephalic and atraumatic.  Eyes:     General:        Right eye: Hordeolum present.     Conjunctiva/sclera: Conjunctivae normal.     Pupils: Pupils are equal, round, and reactive to light.   Cardiovascular:     Rate and Rhythm: Normal rate.  Pulmonary:     Effort: Pulmonary effort is normal. No respiratory distress.  Abdominal:     General: There is no distension.     Palpations: Abdomen is soft.  Musculoskeletal:        General: Normal range of motion.     Cervical back: Normal range of motion.  Skin:    General: Skin is warm and dry.  Neurological:     Mental Status: She is alert.      UC Treatments / Results  Labs (all labs ordered are listed, but only abnormal results are displayed) Labs Reviewed - No data to display  EKG   Radiology No results found.  Procedures Procedures (including critical care time)  Medications Ordered in UC Medications - No data to display  Initial Impression / Assessment and Plan / UC Course  I have reviewed the triage vital signs and the nursing notes.  Pertinent labs & imaging results that were available during my care of the patient were reviewed by me and considered in my medical decision making (see chart for details).     Patient states she was given Septra  at her last appointment and this worked well for her, however, there is a warning generated when I tried to prescribe this because of her Coumadin use.  I will therefore place her on doxycycline .  She can  continue her eyedrops.  See eye doctor if fails to improve Final Clinical Impressions(s) / UC Diagnoses   Final diagnoses:  Hordeolum internum of right lower eyelid     Discharge Instructions      Take doxycycline  2 times a day.  Take this antibiotic with food Continue eyedrops prescribed by your eye doctor See your eye doctor if you fail to improve by the end of the week   ED Prescriptions     Medication Sig Dispense Auth. Provider   doxycycline  (VIBRAMYCIN ) 100 MG capsule Take 1 capsule (100 mg total) by mouth 2 (two) times daily. 14 capsule Maranda Jamee Jacob, MD      PDMP not reviewed this encounter.   Maranda Jamee Jacob, MD 11/08/23 [phone]

## 2023-11-09 ENCOUNTER — Ambulatory Visit: Payer: Self-pay | Admitting: Family Medicine

## 2023-11-09 LAB — CMP14+EGFR
ALT: 28 IU/L (ref 0–32)
AST: 33 IU/L (ref 0–40)
Albumin: 3.8 g/dL — ABNORMAL LOW (ref 3.9–4.9)
Alkaline Phosphatase: 50 IU/L (ref 49–135)
BUN/Creatinine Ratio: 21 (ref 12–28)
BUN: 24 mg/dL (ref 8–27)
Bilirubin Total: 0.4 mg/dL (ref 0.0–1.2)
CO2: 21 mmol/L (ref 20–29)
Calcium: 9 mg/dL (ref 8.7–10.3)
Chloride: 99 mmol/L (ref 96–106)
Creatinine, Ser: 1.13 mg/dL — ABNORMAL HIGH (ref 0.57–1.00)
Globulin, Total: 2.8 g/dL (ref 1.5–4.5)
Glucose: 113 mg/dL — ABNORMAL HIGH (ref 70–99)
Potassium: 4.4 mmol/L (ref 3.5–5.2)
Sodium: 136 mmol/L (ref 134–144)
Total Protein: 6.6 g/dL (ref 6.0–8.5)
eGFR: 53 mL/min/1.73 — ABNORMAL LOW (ref >59)

## 2023-11-09 NOTE — Progress Notes (Signed)
 Hi Latiya, kidney function is stable at 1.1.  Liver enzymes look good.

## 2023-11-18 ENCOUNTER — Ambulatory Visit

## 2023-11-23 ENCOUNTER — Ambulatory Visit: Payer: Self-pay | Admitting: Family Medicine

## 2023-11-23 LAB — POCT INR

## 2023-11-23 NOTE — Progress Notes (Signed)
 [name] at goal.

## 2023-12-09 ENCOUNTER — Ambulatory Visit (INDEPENDENT_AMBULATORY_CARE_PROVIDER_SITE_OTHER)

## 2023-12-09 VITALS — BP 138/61 | HR 67 | Ht 62.0 in | Wt 150.0 lb

## 2023-12-09 DIAGNOSIS — Z0000 Encounter for general adult medical examination without abnormal findings: Secondary | ICD-10-CM

## 2023-12-09 NOTE — Patient Instructions (Signed)
  Erin Hill , Thank you for taking time to come for your Medicare Wellness Visit. I appreciate your ongoing commitment to your health goals. Please review the following plan we discussed and let me know if I can assist you in the future.   These are the goals we discussed:  Goals      Exercise 150 min/wk Moderate Activity     Work on getting to the gym 3 days per week consistently.      Patient Stated     Patient states she would like to increase water intake and eat healthy.         This is a list of the screening recommended for you and due dates:  Health Maintenance  Topic Date Due   Zoster (Shingles) Vaccine (1 of 2) Never done   COVID-19 Vaccine (6 - 2025-26 season) 10/18/2023   Breast Cancer Screening  01/11/2024   Complete foot exam   01/28/2024   Hemoglobin A1C  03/29/2024   Eye exam for diabetics  08/22/2024   Yearly kidney health urinalysis for diabetes  10/03/2024   Yearly kidney function blood test for diabetes  11/07/2024   Medicare Annual Wellness Visit  12/08/2024   DTaP/Tdap/Td vaccine (4 - Td or Tdap) 07/11/2025   Colon Cancer Screening  10/24/2028   Pneumococcal Vaccine for age over 23  Completed   Flu Shot  Completed   DEXA scan (bone density measurement)  Completed   Hepatitis C Screening  Completed   Meningitis B Vaccine  Aged Out   Hepatitis B Vaccine  Discontinued

## 2023-12-09 NOTE — [facility] Notes (Signed)
 Subjective:   Erin Hill is a 68 y.o. female who presents for Medicare Annual (Subsequent) preventive examination.  Visit Complete: In person  Patient Medicare AWV questionnaire was completed by the patient on 12/05/2023; I have confirmed that all information answered by patient is correct and no changes since this date.  Cardiac Risk Factors include: hypertension;advanced age (>47men, >66 women);diabetes mellitus     Objective:    Today's Vitals   12/09/23 1549  BP: 138/61  Pulse: 67  SpO2: 98%  Weight: 150 lb (68 kg)  Height: 5' 2 (1.575 m)   Body mass index is 27.44 kg/m.     12/09/2023    3:58 PM 08/27/2021    2:53 PM 02/19/2017    1:30 PM 06/05/2013    3:15 PM  Advanced Directives  Does Patient Have a Medical Advance Directive? Yes Yes Yes  Patient has advance directive, copy not in chart   Type of Advance Directive Healthcare Power of [place];Living will Healthcare Power of [place];Living will Healthcare Power of [place];Living will Healthcare Power of [place];Living will   Does patient want to make changes to medical advance directive? No - Patient declined     Copy of Healthcare Power of Attorney in Chart?  No - copy requested No - copy requested  Copy requested from family      Data saved with a previous flowsheet row definition     Current Medications (verified) Outpatient Encounter Medications as of 12/09/2023  Medication Sig   AMBULATORY NON FORMULARY MEDICATION Take 1 capsule by mouth daily. Medication Name: HERBAL FORMULARY TUMERIC,GARLIC,GINGER,ROSEMARY,CINNAMON   Coenzyme Q-10 100 MG capsule Take 300 mg by mouth daily.   fexofenadine  (ALLEGRA ) 180 MG tablet Take 1 tablet (180 mg total) by mouth daily.   glucose blood (ACCU-CHEK GUIDE TEST) test strip Use as instructed. Check fasting blood sugar every morning. DX: E11.9   Lancets (ACCU-CHEK MULTICLIX) lancets Use to check blood sugar twice daily. Dx: R73.01   metFORMIN  (GLUCOPHAGE -XR) 500 MG 24 hr  tablet Take 1 tablet (500 mg total) by mouth 2 (two) times daily with a meal.   metoprolol  succinate (TOPROL -XL) 100 MG 24 hr tablet TAKE 1 TABLET BY MOUTH DAILY WITH OR IMMEDIATELY FOLLOWING A MEAL.   Olmesartan -amLODIPine -HCTZ 40-5-25 MG TABS TAKE 1 TABLET BY MOUTH EVERY DAY   OVER THE COUNTER MEDICATION Super C ( vit D, Vit D, zinc)   rosuvastatin  (CRESTOR ) 10 MG tablet TAKE 1 TABLET BY MOUTH EVERY DAY   sitaGLIPtin  (JANUVIA ) 25 MG tablet Take 1 tablet (25 mg total) by mouth daily. Fax to [phone]   Vitamin K, Phytonadione, 100 MCG TABS Take 100 mcg daily by mouth.   warfarin (COUMADIN) 5 MG tablet TAKE 1 TABLET BY MOUTH EVERY MONDAY,WEDNESDAY, AND FRIDAY. TAKE 1/2 TABLET ALL OTHER DAYS AS DIRECTED BY COUMADIN CLINIC.   [DISCONTINUED] doxycycline  (VIBRAMYCIN ) 100 MG capsule Take 1 capsule (100 mg total) by mouth 2 (two) times daily.   No facility-administered encounter medications on file as of 12/09/2023.    Allergies (verified) Erythromycin, Gemfibrozil, Shellfish allergy, Shellfish protein-containing drug products, Strawberry extract, Versed [midazolam], Farxiga  [dapagliflozin ], Iodinated contrast media, Lipitor [atorvastatin ], and Protonix [pantoprazole sodium]   History: Past Medical History:  Diagnosis Date   Allergy    Atrial flutter (HCC)    2015   GERD (gastroesophageal reflux disease)    Hearing loss    chronic   Heart murmur    Hyperlipidemia    Hypertension    Miscarriage  X 2   Proteinuria    chronic (not related to DM)   Rheumatic heart disease    s/p aortic/ mitral valve replace   Past Surgical History:  Procedure Laterality Date   CARDIAC VALVE REPLACEMENT  03/06/97   Mitral/Aortic mechanical valves   double heart valve replacement  02/16/1997   echo EF 75-80%     SAB     X 2   stapes surgery, left  02/16/2001   then removed 2 years later   TAB      X 1   TONSILLECTOMY  as a child   Family History  Problem Relation Age of Onset   Diabetes  Father    Hyperlipidemia Father    Hypertension Father    Hearing loss Father    Lung cancer Mother        smoked until her 99's   Hypertension Mother    Arthritis Mother    Cancer Mother    Diabetes Mother    Diabetes Sister    Social History   Socioeconomic History   Marital status: Widowed    Spouse name: Maude   Number of children: Not on file   Years of education: Not on file   Highest education level: Associate degree: occupational, Scientist, product/process development, or vocational program  Occupational History   Occupation: Printmaker: [employer]  Tobacco Use   Smoking status: Never   Smokeless tobacco: Never  Vaping Use   Vaping status: Never Used  Substance and Sexual Activity   Alcohol use: Never   Drug use: Never   Sexual activity: Yes    Partners: Male  Other Topics Concern   Not on file  Social History Narrative   No regular exercise. Erin Hill lives alone. She enjoys diamond dots and pet sitting.    Social Drivers of Corporate investment banker Strain: Low Risk  (12/09/2023)   Overall Financial Resource Strain (CARDIA)    Difficulty of Paying Living Expenses: Not hard at all  Food Insecurity: No Food Insecurity (12/09/2023)   Hunger Vital Sign    Worried About Running Out of Food in the Last Year: Never true    Ran Out of Food in the Last Year: Never true  Transportation Needs: No Transportation Needs (12/09/2023)   PRAPARE - Administrator, Civil Service (Medical): No    Lack of Transportation (Non-Medical): No  Physical Activity: Sufficiently Active (12/09/2023)   Exercise Vital Sign    Days of Exercise per Week: 4 days    Minutes of Exercise per Session: 50 min  Recent Concern: Physical Activity - Insufficiently Active (12/05/2023)   Exercise Vital Sign    Days of Exercise per Week: 1 day    Minutes of Exercise per Session: 10 min  Stress: No Stress Concern Present (12/09/2023)   [employer] of Occupational Health - Occupational  Stress Questionnaire    Feeling of Stress: Only a little  Social Connections: Moderately Integrated (12/09/2023)   Social Connection and Isolation Panel    Frequency of Communication with Friends and Family: More than three times a week    Frequency of Social Gatherings with Friends and Family: More than three times a week    Attends Religious Services: More than 4 times per year    Active Member of [employer] or Organizations: Yes    Attends Banker Meetings: More than 4 times per year    Marital Status: Widowed    Tobacco Counseling Counseling  given: Not Answered   Clinical Intake:  Pre-visit preparation completed: Yes  Pain : No/denies pain     BMI - recorded: 27.44 Nutritional Status: BMI 25 -29 Overweight Nutritional Risks: None Diabetes: Yes CBG done?: Yes (120) CBG resulted in Enter/ Edit results?: No Did pt. bring in CBG monitor from home?: No  How often do you need to have someone help you when you read instructions, pamphlets, or other written materials from your doctor or pharmacy?: 1 - Never What is the last grade level you completed in school?: 14  Interpreter Needed?: No      Activities of Daily Living    12/09/2023    3:52 PM 12/05/2023    9:40 AM  In your present state of health, do you have any difficulty performing the following activities:  Hearing? 0 0  Vision? 0 0  Difficulty concentrating or making decisions? 0 0  Walking or climbing stairs? 0 0  Dressing or bathing? 0 0  Doing errands, shopping? 0 0  Preparing Food and eating ? N N  Using the Toilet? N N  In the past six months, have you accidently leaked urine? N N  Do you have problems with loss of bowel control? N N  Managing your Medications? N N  Managing your Finances? N N  Housekeeping or managing your Housekeeping? N N    Patient Care Team: Alvan Dorothyann BIRCH, MD as PCP - General Abel Hamilton (Cardiology) Geroge Iha, OD as Referring Physician  (Optometry)  Indicate any recent Medical Services you may have received from other than [facility] providers in the past year (date may be approximate).     Assessment:   This is a routine wellness examination for Erin Hill.  Hearing/Vision screen No results found.   Goals Addressed             This Visit's Progress    Patient Stated       Patient states she would like to increase water intake and eat healthy.        Depression Screen    12/09/2023    3:57 PM 11/04/2023   11:46 AM 09/09/2022   11:39 AM 11/04/2021    2:27 PM 12/20/2020    1:39 PM 04/02/2020    8:11 AM 12/13/2018    8:47 AM  PHQ 2/9 Scores  PHQ - 2 Score 0 0 0 0 0 0 0    Fall Risk    12/09/2023    3:59 PM 12/05/2023    9:40 AM 11/14/2023   10:26 AM 09/09/2022   11:39 AM 07/09/2022    8:17 AM  Fall Risk   Falls in the past year? 0 1 0 0 0  Number falls in past yr: 0   0 0  Injury with Fall? 0   0 0  Risk for fall due to : No Fall Risks   No Fall Risks No Fall Risks  Follow up Falls evaluation completed   Falls evaluation completed Falls evaluation completed    MEDICARE RISK AT HOME: Medicare Risk at Home Any stairs in or around the home?: Yes If so, are there any without handrails?: No Home free of loose throw rugs in walkways, pet beds, electrical cords, etc?: Yes Adequate lighting in your home to reduce risk of falls?: Yes Life alert?: No Use of a cane, walker or w/c?: No Grab bars in the bathroom?: Yes Shower chair or bench in shower?: No Elevated toilet seat or a handicapped toilet?: No  TIMED UP AND GO:  Was the test performed?  Yes  Length of time to ambulate 10 feet: 7 sec Gait steady and fast without use of assistive device    Cognitive Function:        12/09/2023    3:59 PM 11/04/2021    2:27 PM  6CIT Screen  What Year? 0 points 0 points  What month? 0 points 0 points  What time? 0 points 0 points  Count back from 20 0 points 0 points  Months in reverse 0 points 0 points  Repeat  phrase 0 points 0 points  Total Score 0 points 0 points    Immunizations Immunization History  Administered Date(s) Administered   Fluad Quad(high Dose 65+) 11/04/2021   Fluad Trivalent(High Dose 65+) 10/29/2022   Hepatitis B 06/08/2005, 07/06/2005, 12/21/2005   INFLUENZA, HIGH DOSE SEASONAL PF 10/28/2023   Influenza Split 11/17/2011   Influenza Whole 12/08/2005, 12/18/2007, 12/10/2008, 11/16/2009   Influenza,inj,Quad PF,6+ Mos 11/08/2018, 11/03/2019, 12/20/2020   PFIZER Comirnaty(Gray Top)Covid-19 Tri-Sucrose Vaccine 08/10/2020   PFIZER(Purple Top)SARS-COV-2 Vaccination 05/09/2019, 05/30/2019, 01/02/2020   PNEUMOCOCCAL CONJUGATE-20 05/27/2023   Pfizer Covid-19 Vaccine Bivalent Booster 58yrs & up 02/07/2021   Pneumococcal Polysaccharide-23 12/28/2005   Td 03/19/2001, 02/16/2005   Tdap 07/12/2015   Varicella 02/25/2005, 12/13/2009    TDAP status: Up to date  Flu Vaccine status: Up to date  Pneumococcal vaccine status: Up to date  Covid-19 vaccine status: Declined, Education has been provided regarding the importance of this vaccine but patient still declined. Advised may receive this vaccine at local pharmacy or Health Dept.or vaccine clinic. Aware to provide a copy of the vaccination record if obtained from local pharmacy or Health Dept. Verbalized acceptance and understanding.  Qualifies for Shingles Vaccine? Yes   Zostavax completed No   Shingrix Completed?: No.    Education has been provided regarding the importance of this vaccine. Patient has been advised to call insurance company to determine out of pocket expense if they have not yet received this vaccine. Advised may also receive vaccine at local pharmacy or Health Dept. Verbalized acceptance and understanding.  Screening Tests Health Maintenance  Topic Date Due   Zoster Vaccines- Shingrix (1 of 2) Never done   COVID-19 Vaccine (6 - 2025-26 season) 10/18/2023   Mammogram  01/11/2024   FOOT EXAM  01/28/2024    HEMOGLOBIN A1C  03/29/2024   OPHTHALMOLOGY EXAM  08/22/2024   Diabetic kidney evaluation - Urine ACR  10/03/2024   Diabetic kidney evaluation - eGFR measurement  11/07/2024   Medicare Annual Wellness (AWV)  12/08/2024   DTaP/Tdap/Td (4 - Td or Tdap) 07/11/2025   Colonoscopy  10/24/2028   Pneumococcal Vaccine: 50+ Years  Completed   Influenza Vaccine  Completed   DEXA SCAN  Completed   Hepatitis C Screening  Completed   Meningococcal B Vaccine  Aged Out   Hepatitis B Vaccines 19-59 Average Risk  Discontinued    Health Maintenance  Health Maintenance Due  Topic Date Due   Zoster Vaccines- Shingrix (1 of 2) Never done   COVID-19 Vaccine (6 - 2025-26 season) 10/18/2023   Mammogram  01/11/2024    Colorectal cancer screening: Type of screening: Colonoscopy. Completed 10/25/2018. Repeat every 10 years  Mammogram status: Completed 01/11/2023. Repeat every year  Bone Density status: Completed 12/30/2021. Results reflect: Bone density results: OSTEOPENIA. Repeat every 5 years.  Lung Cancer Screening: (Low Dose CT Chest recommended if Age 59-80 years, 20 pack-year currently smoking OR have quit w/in 15years.)  does not qualify.   Lung Cancer Screening Referral: n/a  Additional Screening:  Hepatitis C Screening: does qualify; Completed 02/26/2017  Vision Screening: Recommended annual ophthalmology exams for early detection of glaucoma and other disorders of the eye. Is the patient up to date with their annual eye exam?  Yes  Who is the provider or what is the name of the office in which the patient attends annual eye exams? [facility] If pt is not established with a provider, would they like to be referred to a provider to establish care? N/a.   Dental Screening: Recommended annual dental exams for proper oral hygiene  Diabetic Foot Exam: Diabetic Foot Exam: Completed 01/28/2023  Community Resource Referral / Chronic Care Management: CRR required this visit?  No   CCM required  this visit?  No     Plan:     I have personally reviewed and noted the following in the patient's chart:   Medical and social history Use of alcohol, tobacco or illicit drugs  Current medications and supplements including opioid prescriptions. Patient is not currently taking opioid prescriptions. Functional ability and status Nutritional status Physical activity Advanced directives List of other physicians Hospitalizations, surgeries, and ER visits in previous 12 months. None Vitals Screenings to include cognitive, depression, and falls Referrals and appointments  In addition, I have reviewed and discussed with patient certain preventive protocols, quality metrics, and best practice recommendations. A written personalized care plan for preventive services as well as general preventive health recommendations were provided to patient.     Bonny Jon Mayor, CMA   12/09/2023   After Visit Summary: (In Person-Declined) Patient declined AVS at this time.  Nurse Notes:   Erin Hill is a 68 y.o. female patient of Dorothyann Byars, MD who had a Medicare Annual Wellness Visit today. Erin Hill lives alone. She reports that she is socially active and does interact with friends/family regularly. She is moderately physically active and enjoys diamond dots and pet sitting.

## 2023-12-30 ENCOUNTER — Encounter: Payer: Self-pay | Admitting: Family Medicine

## 2024-01-05 ENCOUNTER — Ambulatory Visit: Payer: Self-pay | Admitting: Family Medicine

## 2024-01-05 ENCOUNTER — Other Ambulatory Visit: Payer: Self-pay | Admitting: *Deleted

## 2024-01-05 ENCOUNTER — Encounter: Payer: Self-pay | Admitting: Family Medicine

## 2024-01-05 ENCOUNTER — Other Ambulatory Visit: Payer: Self-pay

## 2024-01-05 DIAGNOSIS — E1169 Type 2 diabetes mellitus with other specified complication: Secondary | ICD-10-CM

## 2024-01-05 MED ORDER — ACCU-CHEK SOFTCLIX LANCETS MISC: 12 refills | Status: AC

## 2024-01-05 NOTE — Progress Notes (Signed)
 [name] okay at 2.5 repeat in 2 weeks.

## 2024-01-19 ENCOUNTER — Encounter: Payer: Self-pay | Admitting: Family Medicine

## 2024-01-19 LAB — HM MAMMOGRAPHY

## 2024-01-20 ENCOUNTER — Telehealth: Payer: Self-pay | Admitting: Family Medicine

## 2024-01-20 ENCOUNTER — Encounter: Payer: Self-pay | Admitting: Family Medicine

## 2024-01-20 NOTE — Telephone Encounter (Signed)
 Patient dropped off document [employer] Help RX Form, to be filled out by provider. Patient requested to send it back via Fax within 7-days. Document is located in providers tray at front office.Please advise at [facility] [phone]

## 2024-01-21 NOTE — Telephone Encounter (Signed)
 Forms for Januvia  faxed,confirmation received and scanned into pt's chart.

## 2024-01-21 NOTE — Telephone Encounter (Signed)
 Pls carify drug name?

## 2024-01-27 ENCOUNTER — Other Ambulatory Visit: Payer: Self-pay | Admitting: Family Medicine

## 2024-02-01 NOTE — Progress Notes (Signed)
At goal, no change.

## 2024-02-17 ENCOUNTER — Other Ambulatory Visit: Payer: Self-pay | Admitting: Family Medicine

## 2024-03-16 ENCOUNTER — Ambulatory Visit

## 2024-03-16 ENCOUNTER — Ambulatory Visit: Admitting: Family Medicine

## 2024-03-16 ENCOUNTER — Encounter: Payer: Self-pay | Admitting: Family Medicine

## 2024-03-16 VITALS — BP 118/80 | HR 70 | Ht 62.0 in | Wt 150.0 lb

## 2024-03-16 DIAGNOSIS — M25562 Pain in left knee: Secondary | ICD-10-CM

## 2024-03-16 DIAGNOSIS — L600 Ingrowing nail: Secondary | ICD-10-CM | POA: Diagnosis not present

## 2024-03-16 DIAGNOSIS — M25462 Effusion, left knee: Secondary | ICD-10-CM

## 2024-03-16 NOTE — Progress Notes (Signed)
 "  Acute Office Visit  Patient ID: Erin Hill, female    DOB: 02-25-1955, 69 y.o.   MRN: [id number]  PCP: Alvan Dorothyann BIRCH, MD  Chief Complaint  Patient presents with   Knee Pain    Left knee pain    Subjective:     HPI  Discussed the use of AI scribe software for clinical note transcription with the patient, who gave verbal consent to proceed.  History of Present Illness Erin Hill is a 69 year old who presents with left knee pain and swelling following a fall.  Left knee pain and swelling - Onset approximately one week after a fall that occurred five weeks ago - Pain described as a pressure sensation underneath the patella - Intermittent swelling, with the knee appearing puffy compared to the contralateral side - Pain is not constant and does not significantly interfere with daily activities - Discomfort is more noticeable when sitting or standing up - Occasional radiation of pain down to the shin - No knee instability or locking - Temporary relief of pain and swelling after sauna use, with significant improvement the following day - No use of specific medications for knee pain  Toenail sensitivity, right great toe - Persistent sensitivity of a toenail, previously red and painful - Improvement after application of antibacterial ointment and covering for two days - Ongoing sensitivity, especially with pressure to a specific area - No pus or discharge observed - Removal of a jagged piece of nail provided temporary relief - Concern for bleeding due to use of blood thinners   ROS     Objective:    BP 118/80   Pulse 70   Ht 5' 2 (1.575 m)   Wt 150 lb (68 kg)   SpO2 99%   BMI 27.44 kg/m    Physical Exam Vitals reviewed.  Constitutional:      Appearance: Normal appearance.  HENT:     Head: Normocephalic.  Pulmonary:     Effort: Pulmonary effort is normal.  Musculoskeletal:     Comments: Left knee with NROM, no crepitis. No pain with valgus or varus  stress. Swelling along anterior lateral joint line.  Tender along the lateral joint line     Skin:    Comments: The medial border of right great toenail very tender to touch.  Some dry skin and tenderness.  No significant erythema or swelling today.  Neurological:     Mental Status: She is alert and oriented to person, place, and time.  Psychiatric:        Mood and Affect: Mood normal.        Behavior: Behavior normal.       No results found for any visits on 03/16/24.     Assessment & Plan:   Problem List Items Addressed This Visit   None Visit Diagnoses       Acute pain of left knee    -  Primary   Relevant Orders   DG Knee Complete 4 Views Left     Ingrown nail of great toe of right foot           Assessment and Plan Assessment & Plan Left knee pain and osteoarthritis Chronic left knee pain with swelling and tenderness, likely osteoarthritis or cartilage tear. Pain relieved by heat, no significant functional limitation. - Ordered x-ray of the left knee to assess for arthritis and joint space narrowing. - Advised icing the knee for five minutes at a time, especially in  the afternoon and before bedtime. - Recommended Voltaren gel, a topical NSAID, to be applied up to four times daily, especially in the morning and at bedtime. - Advised avoiding twisting motions of the knee. - Will consider referral to sports medicine for possible fluid aspiration if swelling persists.  Ingrown toenail Sensitivity and pain without infection. Discussed treatment options including partial nail removal, cotton lift, and home care. - Advised soaking the toe in warm water with Epsom salt nightly to soften the skin. - Instructed to gently massage the skin away from the nail after soaking and apply moisturizer. - Discussed the option of using a cotton lift if home care is ineffective. - Will consider partial nail removal as a last resort if other methods fail.    No orders of the defined  types were placed in this encounter.   No follow-ups on file.  Dorothyann Byars, MD [facility] at [facility]   "

## 2024-03-21 ENCOUNTER — Ambulatory Visit: Payer: Self-pay | Admitting: Family Medicine

## 2024-03-21 DIAGNOSIS — M25562 Pain in left knee: Secondary | ICD-10-CM

## 2024-03-21 NOTE — Progress Notes (Signed)
 The x-ray of your knee shows no acute fracture or dislocation.  There is some chondrocalcinosis which can possibly indicate pseudogout but is not necessarily diagnostic.  I would like to get you in with one of our sports med docs for further workup at this point if you are okay with that let me know.  We have a good sports med doc at [facility] location his name is Dr. Redell Robes

## 2024-03-28 ENCOUNTER — Ambulatory Visit

## 2024-03-29 ENCOUNTER — Ambulatory Visit: Admitting: Family Medicine

## 2024-12-14 ENCOUNTER — Ambulatory Visit
